# Patient Record
Sex: Female | Born: 1985 | Race: White | Hispanic: Yes | Marital: Single | Smoking: Never smoker
Health system: Southern US, Community
[De-identification: ages and names within clinical notes are randomized; demographics above are authoritative.]

## PROBLEM LIST (undated history)

## (undated) ENCOUNTER — Ambulatory Visit: Admission: EM | Payer: Self-pay

## (undated) ENCOUNTER — Inpatient Hospital Stay (HOSPITAL_COMMUNITY): Payer: Self-pay

## (undated) ENCOUNTER — Emergency Department (HOSPITAL_COMMUNITY): Payer: Self-pay | Source: Home / Self Care

## (undated) DIAGNOSIS — N159 Renal tubulo-interstitial disease, unspecified: Secondary | ICD-10-CM

## (undated) DIAGNOSIS — O24419 Gestational diabetes mellitus in pregnancy, unspecified control: Secondary | ICD-10-CM

## (undated) HISTORY — PX: APPENDECTOMY: SHX54

## (undated) HISTORY — PX: CHOLECYSTECTOMY: SHX55

---

## 2007-08-23 ENCOUNTER — Ambulatory Visit: Payer: Self-pay | Admitting: Internal Medicine

## 2007-10-20 ENCOUNTER — Emergency Department (HOSPITAL_COMMUNITY): Admission: EM | Admit: 2007-10-20 | Discharge: 2007-10-20 | Payer: Self-pay | Admitting: Emergency Medicine

## 2007-10-20 ENCOUNTER — Inpatient Hospital Stay (HOSPITAL_COMMUNITY): Admission: AD | Admit: 2007-10-20 | Discharge: 2007-10-20 | Payer: Self-pay | Admitting: Obstetrics

## 2008-01-28 ENCOUNTER — Inpatient Hospital Stay (HOSPITAL_COMMUNITY): Admission: AD | Admit: 2008-01-28 | Discharge: 2008-01-30 | Payer: Self-pay | Admitting: Obstetrics

## 2008-03-16 ENCOUNTER — Ambulatory Visit: Payer: Self-pay | Admitting: Internal Medicine

## 2008-03-16 ENCOUNTER — Encounter: Payer: Self-pay | Admitting: Internal Medicine

## 2008-03-16 DIAGNOSIS — R21 Rash and other nonspecific skin eruption: Secondary | ICD-10-CM | POA: Insufficient documentation

## 2008-03-20 ENCOUNTER — Ambulatory Visit: Payer: Self-pay | Admitting: *Deleted

## 2008-05-09 ENCOUNTER — Ambulatory Visit: Payer: Self-pay | Admitting: Internal Medicine

## 2008-05-10 ENCOUNTER — Ambulatory Visit: Payer: Self-pay | Admitting: Family Medicine

## 2008-08-07 ENCOUNTER — Ambulatory Visit: Payer: Self-pay | Admitting: Internal Medicine

## 2010-05-06 LAB — CBC
HCT: 32.5 % — ABNORMAL LOW (ref 36.0–46.0)
Hemoglobin: 11.2 g/dL — ABNORMAL LOW (ref 12.0–15.0)
MCHC: 34.5 g/dL (ref 30.0–36.0)
RDW: 13.2 % (ref 11.5–15.5)

## 2010-09-02 ENCOUNTER — Observation Stay (HOSPITAL_COMMUNITY)
Admission: EM | Admit: 2010-09-02 | Discharge: 2010-09-04 | Disposition: A | Discharge status: Home / Self Care | Payer: Medicaid Other | Attending: General Surgery | Admitting: General Surgery

## 2010-09-02 ENCOUNTER — Emergency Department (HOSPITAL_COMMUNITY): Payer: Medicaid Other

## 2010-09-02 DIAGNOSIS — K810 Acute cholecystitis: Principal | ICD-10-CM | POA: Insufficient documentation

## 2010-09-02 DIAGNOSIS — Z01812 Encounter for preprocedural laboratory examination: Secondary | ICD-10-CM | POA: Insufficient documentation

## 2010-09-02 LAB — CBC
HCT: 36.6 % (ref 36.0–46.0)
Hemoglobin: 13.1 g/dL (ref 12.0–15.0)
MCV: 86.7 fL (ref 78.0–100.0)
RDW: 12.5 % (ref 11.5–15.5)
WBC: 13.2 10*3/uL — ABNORMAL HIGH (ref 4.0–10.5)

## 2010-09-02 LAB — LIPASE, BLOOD: Lipase: 27 U/L (ref 11–59)

## 2010-09-02 LAB — URINE MICROSCOPIC-ADD ON

## 2010-09-02 LAB — DIFFERENTIAL
Eosinophils Relative: 3 % (ref 0–5)
Lymphocytes Relative: 17 % (ref 12–46)
Lymphs Abs: 2.3 10*3/uL (ref 0.7–4.0)

## 2010-09-02 LAB — URINALYSIS, ROUTINE W REFLEX MICROSCOPIC
Bilirubin Urine: NEGATIVE
Glucose, UA: NEGATIVE mg/dL
Ketones, ur: NEGATIVE mg/dL
pH: 6.5 (ref 5.0–8.0)

## 2010-09-02 LAB — COMPREHENSIVE METABOLIC PANEL
ALT: 49 U/L — ABNORMAL HIGH (ref 0–35)
Albumin: 4 g/dL (ref 3.5–5.2)
Alkaline Phosphatase: 62 U/L (ref 39–117)
BUN: 10 mg/dL (ref 6–23)
Potassium: 3.7 mEq/L (ref 3.5–5.1)
Sodium: 139 mEq/L (ref 135–145)
Total Protein: 7.8 g/dL (ref 6.0–8.3)

## 2010-09-03 ENCOUNTER — Other Ambulatory Visit (INDEPENDENT_AMBULATORY_CARE_PROVIDER_SITE_OTHER): Payer: Self-pay | Admitting: General Surgery

## 2010-09-03 DIAGNOSIS — K8010 Calculus of gallbladder with chronic cholecystitis without obstruction: Secondary | ICD-10-CM

## 2010-09-04 NOTE — H&P (Signed)
  Martha [name], Martha [name] NO.:  [id number]  MEDICAL RECORD NO.:  [id number]  LOCATION:  5154                         FACILITY:  [facility]  PHYSICIAN:  Abigail Miyamoto, M.D. DATE OF BIRTH:  1985-10-13  DATE OF ADMISSION: DATE OF DISCHARGE:                             HISTORY & PHYSICAL   CHIEF COMPLAINT:  Right upper quadrant abdominal pain.  HISTORY:  A 25 year old Hispanic female who presents with right upper quadrant abdominal pain, nausea, and vomiting.  She has had epistaxis in the past.  Because this has been unrelenting, decision was made to proceed her onto the emergency room.  Surgery has been consulted.  She is otherwise healthy.  She speaks a little [place].  PAST MEDICAL HISTORY:  Negative.  PAST SURGICAL HISTORY:  Negative.  MEDICATIONS:  None.  ALLERGIES:  No known drug allergies.  SOCIAL HISTORY:  She does not smoke.  She does not drink alcohol.  FAMILY HISTORY:  Negative for diabetes and hypertension.  REVIEW OF SYSTEMS:  GENERAL:  Negative fever or chills.  PULMONARY: Negative cough, shortness of breath, difficulty breathing.  CARDIAC: Negative chest pain, irregular heartbeat.  ABDOMEN:  Listed as above. There is no hematemesis.  Bowel movements are normal.  Urinary negative for dysuria or hematuria.  Rest of the review of systems including skin, eyes, ears, nose, throat, musculoskeletal, neurologic, psychiatric, endocrine are normal.  PHYSICAL EXAMINATION:  GENERAL:  This is a Hispanic female who is in mild discomfort. VITAL SIGNS:  Temperature 97.6, pulse 76, respiratory rate 16, blood pressure is 111/74, saturating 100% on room air. EYES:  Anicteric.  Pupils reactive bilaterally. ENT:  External ears and nose are normal.  Hearing is normal.  Oropharynx is clear. NECK:  Supple.  Trachea is midline.  There is no thyromegaly. LUNGS:  Clear to auscultation bilaterally.  Normal respiratory effort. CARDIOVASCULAR:  Regular rate and rhythm.   There are no murmurs.  There is no peripheral edema. ABDOMEN:  Soft.  There is tenderness and guarding in the upper quadrant which is moderate.  There are no hernias.  There is no organomegaly. SKIN:  Shows no rash, no jaundice. EXTREMITIES:  Warm and well-perfused.  No edema, clubbing, or cyanosis. STRENGTH AND NEUROLOGIC:  Intact in all four extremities.  Pulses are intact in all four extremities.  DATA REVIEW:  The patient has an ultrasound of the abdomen showing to have a cholelithiasis, minimal gallbladder wall thickening, bile duct is normal.  She has a CBC with elevated white blood count 13.2.  Liver function tests show a bilirubin of 0.4, alk phos is 62, AST 44, ALT 49.  IMPRESSION:  This patient with acute cholecystitis with cholelithiasis. She will be admitted to the hospital for IV antibiotics, bowel rest, and probable laparoscopic cholecystectomy.     Abigail Miyamoto, M.D.     DB/MEDQ  D:  09/03/2010  T:  09/03/2010  Job:  [id number]  Electronically Signed by Abigail Miyamoto M.D. on 09/04/2010 01:40:29 PM

## 2010-09-05 NOTE — Op Note (Signed)
[name], [name] NO.:  [id number]  MEDICAL RECORD NO.:  [id number]  LOCATION:  5154                         FACILITY:  [facility]  PHYSICIAN:  Martha [name], MDDATE OF BIRTH:  November 22, 1985  DATE OF PROCEDURE:  09/03/2010 DATE OF DISCHARGE:                              OPERATIVE REPORT   PREOPERATIVE DIAGNOSIS:  Acute cholecystitis.  POSTOPERATIVE DIAGNOSIS:  Acute cholecystitis.  PROCEDURE:  Laparoscopic cholecystectomy.  SURGEON:  Martha Gosling, MD.  ASSISTANT:  Brayton El, PA-C  ANESTHESIA:  General.  Specimens of gallbladder and contents to pathology.  ESTIMATED BLOOD LOSS:  Minimal.  COMPLICATIONS:  None.  DRAINS:  None.  DISPOSITION:  The patient to recovery room in stable condition.  INDICATIONS:  This is a 25 year old female who has a history of biliary colic who presents now with unrelenting right upper quadrant pain and on exam consistent with cholecystitis.  Her ultrasound shows gallstones with minimally thickened wall and a normal common bile duct.  Her liver function tests are normal and her white blood cell count is elevated through a translator.  I discussed with her a laparoscopic cholecystectomy when the risks and benefits associated with that procedure.  PROCEDURE:  After informed consent was obtained, the patient was taken to the operating room.  She was administered Zosyn on the floor per Dr. Eliberto Ivory orders.  She had sequential compression devices were placed on her lower extremities prior to induction with anesthesia.  She was then placed under general anesthesia without complication.  Her abdomen was then prepped and draped in standard sterile surgical fashion. Surgical time-out was then performed.  I infiltrated 0.25% Marcaine below the umbilicus.  I then made an incision with an 11-blade, I carried this down to the umbilical stalk, this was grasped.  The fascia was entered with an 11-blade.   The peritoneum was entered bluntly.  I then placed a 0 Vicryl pursestring suture through the fascia.  A Hasson trocar was then introduced and the abdomen was insufflated with 15 mmHg pressure.  Three further 5-mm ports were placed in the epigastrium and right upper quadrant.  All of these were done under direct vision without complication after infiltration with local anesthetic.  She was then placed in the reverse Trendelenburg position.  I then grasped her gallbladder and retracted it cephalad and lateral.  There were some adhesions at the triangle of Calot that were taken down with blunt dissection.  Eventually, I was able to clearly obtain the critical view of safety.  I then clipped the artery divided that and I then clipped the duct in the similar fashion and divided that.  The gallbladder was removed from the liver bed without difficulty.  There was a small entrance into the gallbladder during this due to some adhesions and there was some leakage of bile making as a class III operation, but no leakage of any stones.  I then placed this in EndoCatch bag removing from the umbilicus.  Irrigation was performed. This was all clear.  Hemostasis was obtained and observed.  I then closed the Hasson port site with a pursestring suture and this completely obliterated the defect.  I then removed all  the other trocars after desufflating the abdomen.  These incisions were then closed with 4- 0 Monocryl and Dermabond.  She tolerated this well, was extubated in the operating room, transferred to the recovery room in stable condition.     Martha Gosling, MD     MCW/MEDQ  D:  09/03/2010  T:  09/03/2010  Job:  [id number]  Electronically Signed by Emelia Loron MD on 09/05/2010 02:20:01 PM

## 2010-09-17 ENCOUNTER — Encounter (INDEPENDENT_AMBULATORY_CARE_PROVIDER_SITE_OTHER): Payer: Self-pay | Admitting: Radiology

## 2010-09-17 ENCOUNTER — Ambulatory Visit (INDEPENDENT_AMBULATORY_CARE_PROVIDER_SITE_OTHER): Payer: Self-pay | Admitting: Radiology

## 2010-09-17 DIAGNOSIS — K8010 Calculus of gallbladder with chronic cholecystitis without obstruction: Secondary | ICD-10-CM

## 2010-09-17 NOTE — Progress Notes (Signed)
Martha [name] is a 25 y.o. female who had a laparoscopic cholecystectomy with intraoperative cholangiogram.  The pathology report confirmed cholelithiasis and cholecystitis.  The patient reports that they are feeling well with normal bowel movements and good appetite.  The pre-operative symptoms of abdominal pain, nausea, and vomiting have resolved.    Physical examination - Incisions appear well-healed with no sign of infection or bleeding.   Abdomen - soft, non-tender  Impression:  s/p laparoscopic cholecystectomy  Plan:  She may resume a regular diet and full activity.  She may follow-up on a PRN basis.

## 2010-10-21 LAB — DIFFERENTIAL
Basophils Absolute: 0
Basophils Relative: 0
Eosinophils Absolute: 0.6
Eosinophils Relative: 6 — ABNORMAL HIGH
Monocytes Absolute: 0.7
Neutro Abs: 6.3

## 2010-10-21 LAB — CBC
HCT: 32.8 — ABNORMAL LOW
Hemoglobin: 11.3 — ABNORMAL LOW
MCHC: 34.5
MCV: 93.1
RDW: 13.5

## 2010-12-02 NOTE — Progress Notes (Signed)
I help [employer] financial assistance.  Interpreter Jumana Paccione 11/29/10 11:00 am

## 2011-06-12 ENCOUNTER — Emergency Department (HOSPITAL_COMMUNITY)
Admission: EM | Admit: 2011-06-12 | Discharge: 2011-06-13 | Disposition: A | Discharge status: Home / Self Care | Payer: Worker's Compensation | Attending: Emergency Medicine | Admitting: Emergency Medicine

## 2011-06-12 ENCOUNTER — Encounter (HOSPITAL_COMMUNITY): Payer: Self-pay | Admitting: Emergency Medicine

## 2011-06-12 DIAGNOSIS — W19XXXA Unspecified fall, initial encounter: Secondary | ICD-10-CM

## 2011-06-12 DIAGNOSIS — M549 Dorsalgia, unspecified: Secondary | ICD-10-CM | POA: Insufficient documentation

## 2011-06-12 NOTE — ED Notes (Signed)
Patient fell at work, complaining of lower back pain.

## 2011-06-13 ENCOUNTER — Emergency Department (HOSPITAL_COMMUNITY): Payer: Worker's Compensation

## 2011-06-13 MED ORDER — METHOCARBAMOL 500 MG PO TABS: 500.0000 mg | ORAL_TABLET | Freq: Two times a day (BID) | ORAL | Status: DC

## 2011-06-13 MED ORDER — TRAMADOL HCL 50 MG PO TABS: 50.0000 mg | ORAL_TABLET | Freq: Four times a day (QID) | ORAL | Status: DC | PRN

## 2011-06-13 MED ORDER — METHOCARBAMOL 500 MG PO TABS
500.0000 mg | ORAL_TABLET | Freq: Once | ORAL | Status: AC
  Administered 2011-06-13: 500 mg via ORAL
  Filled 2011-06-13: qty 1

## 2011-06-13 MED ORDER — KETOROLAC TROMETHAMINE 60 MG/2ML IM SOLN
60.0000 mg | Freq: Once | INTRAMUSCULAR | Status: AC
  Administered 2011-06-13: 60 mg via INTRAMUSCULAR
  Filled 2011-06-13: qty 2

## 2011-06-13 NOTE — ED Provider Notes (Signed)
Medical screening examination/treatment/procedure(s) were performed by non-physician practitioner and as supervising physician I was immediately available for consultation/collaboration.  Faline Langer M Riyanshi Wahab, MD 06/13/11 0655 

## 2011-06-13 NOTE — ED Provider Notes (Signed)
History     CSN: [id number]  Arrival date & time 06/12/11  2214   First MD Initiated Contact with Patient 06/13/11 0026      12:42 AM HPI Patient reports she was at work when she slipped and fell onto her lower back. Reports since then has had severe pain. Painful to move. Denies numbness, tingling, weakness. Denies incontinence, abdominal pain, nausea, vomiting, fever, neck pain.  Patient is a 26 y.o. female presenting with fall and back pain. The history is provided by the patient.  Fall The accident occurred 3 to 5 hours ago. The fall occurred while walking. She landed on a hard floor. There was no blood loss. Pain location: lower back. The pain is severe. She was not ambulatory at the scene. There was no entrapment after the fall. There was no drug use involved in the accident. There was no alcohol use involved in the accident. Pertinent negatives include no fever, no numbness, no abdominal pain, no nausea, no vomiting, no hematuria, no headaches, no hearing loss, no loss of consciousness and no tingling.  Back Pain  This is a new problem. The problem occurs constantly. The problem has not changed since onset.The pain is associated with falling. The pain is present in the lumbar spine. The pain radiates to the left thigh and right thigh. The pain is moderate. The symptoms are aggravated by bending and twisting. Pertinent negatives include no chest pain, no fever, no numbness, no headaches, no abdominal pain, no dysuria, no pelvic pain, no tingling and no weakness. She has tried nothing for the symptoms.    History reviewed. No pertinent past medical history.  Past Surgical History  Procedure Date  . Cholecystectomy     No family history on file.  History  Substance Use Topics  . Smoking status: Not on file  . Smokeless tobacco: Not on file  . Alcohol Use:     OB History    Grav Para Term Preterm Abortions TAB SAB Ect Mult Living                  Review of Systems    Constitutional: Negative for fever and chills.  HENT: Negative for neck pain and neck stiffness.   Cardiovascular: Negative for chest pain.  Gastrointestinal: Negative for nausea, vomiting and abdominal pain.  Genitourinary: Negative for dysuria, urgency, frequency, hematuria, flank pain, vaginal bleeding, vaginal discharge and pelvic pain.  Musculoskeletal: Positive for back pain.       Denies saddle anesthesias, or perineal numbness, urinary or bowel incontinence  Neurological: Negative for tingling, loss of consciousness, weakness, numbness and headaches.  All other systems reviewed and are negative.    Allergies  Review of patient's allergies indicates no known allergies.  Home Medications  No current outpatient prescriptions on file.  BP 121/89  Pulse 64  Temp(Src) 98 F (36.7 C) (Oral)  Resp 16  SpO2 99%  Physical Exam  Vitals reviewed. Constitutional: She is oriented to person, place, and time. Vital signs are normal. She appears well-developed and well-nourished. No distress.  HENT:  Head: Normocephalic and atraumatic.  Eyes: Pupils are equal, round, and reactive to light.  Neck: Neck supple.  Pulmonary/Chest: Effort normal.  Musculoskeletal:       Lumbar back: She exhibits decreased range of motion, tenderness, bony tenderness, pain and spasm. She exhibits no swelling, no deformity, no laceration and normal pulse.       Back:  Neurological: She is alert and oriented to person, place,  and time.  Skin: Skin is warm and dry. No rash noted. No erythema. No pallor.  Psychiatric: She has a normal mood and affect. Her behavior is normal.    ED Course  Procedures  Dg Lumbar Spine Complete  06/13/2011  *RADIOLOGY REPORT*  Clinical Data: Larey Seat, low back pain.  LUMBAR SPINE - COMPLETE 4+ VIEW  Comparison: None.  Findings: Five non-rib bearing lumbar vertebrae with anatomic alignment.  No fractures.  Well-preserved disc spaces. Assimilation joint between the right  transverse process of L5 and the first sacral segment.  No pars defects.  No significant facet arthropathy.  Visualized sacroiliac joints intact.  Intrauterine device.  IMPRESSION: No acute or significant abnormalities.  Original Report Authenticated By: Arnell Sieving, M.D.     MDM     Patient has negative x-ray. Will discharge home with muscle relaxants and analgesics. Provided patient with instructions for back pain in Spanish. Advised return to emergency room for worsening symptoms. Patient voices understanding and is ready for discharge   Thomasene Lot, Cordelia Poche 06/13/11 0131

## 2011-06-13 NOTE — Discharge Instructions (Signed)
Dolor de espalda en el adulto (Back Pain, Adult) El dolor de cintura es frecuente. Aproximadamente 1 de cada 5 personas lo sufren.La causa rara vez pone en peligro la vida. Con frecuencia mejora luego de algn tiempo.Alrededor de la mitad de las personas que sufren un inicio sbito de dolor de cintura, se sentirn mejor luego de 2 semanas. Aproximadamente 8 de cada 10 se sentirn mejor luego de 6 semanas.  CAUSAS  Algunas causas comunes son:   Distensin de los msculos o ligamentos que sostienen la columna vertebral.   Desgaste (degeneracin) de los discos vertebrales.   Artritis   Traumatismos directos en la espalda.  DIAGNSTICO  La mayor parte de las veces, la causa directa no se conoce.Sin embargo, el dolor puede tratarse efectivamente an cuando no se conozca la causa.Una de las formas ms precisas de asegurar que la causa del dolor no constituye un peligro es responder a las preguntas del mdico acerca de su salud y sus sntomas. Si el mdico necesita ms informacin, podr indicar anlisis de laboratorio o realizar un diagnstico por imgenes (radiografas o resonancia magntica).Sin embargo, aunque las imgenes muestren modificaciones, generalmente no es necesaria la ciruga.  INSTRUCCIONES PARA EL CUIDADO EN EL HOGAR  En algunas personas, el dolor de espalda vuelve.Como rara vez es peligroso, los pacientes pueden aprender a manejarlo ellos mismos.   Mantngase activo. Si permanece sentado o de pie mucho tiempo en el mismo lugar, se tensiona la espalda.  No se siente, maneje ni se quede parado en un mismo lugar por ms de 30 minutos. Realice caminatas cortas en superficies planas ni bien el dolor haya cedido. Trate de aumentar cada da el tiempo que camina .   No se quede en la cama.Si hace reposo durante ms de 1 o 2 das, puede retrasar la recuperacin.   No evite los ejercicios ni el trabajo.El cuerpo est hecho para moverse.No es peligroso estar activo, aunque le duela la  espalda.La espalda se curar ms rpido si contina sus actividades antes de que el dolor se vaya.   Preste atencin a su cuerpo cuando se incline y se levante. Muchas personas sienten menos molestias cuando levantan objetos si doblan las rodillas, mantienen la carga cerca del cuerpo y evitan torcerse. Generalmente, las posiciones ms cmodas son las que ejercen menos tensin en la espalda en recuperacin.   Encuentre una posicin cmoda para dormir. Utilice un colchn firme y recustese de costado. Doble ligeramente sus rodillas. Si se recuesta sobre su espalda, coloque una almohada debajo de sus rodillas.   Tome slo medicamentos de venta libre o recetados, segn las indicaciones del mdico. Los medicamentos de venta libre para calmar el dolor y reducir la inflamacin, son los que en general ms ayudan.El mdico podr prescribirle relajantes musculares.Estos medicamentos calman el dolor de modo que pueda retornar a sus actividades normales y a realizar ejercicios saludables.   Aplique hielo sobre la zona lesionada.   Ponga el hielo en una bolsa plstica.   Colquese una toalla entre la piel y la bolsa de hielo.   Deje la bolsa de hielo durante 15 a 20minutos 3 a 4 veces por da, durante los primeros 2  3 das. Luego podr alternar entre calor y hielo para reducir el dolor y los espasmos.   Consulte a su mdico si puede tratar de hacer ejercicios para la espalda y recibir un masaje suave. Pueden ser beneficiosos.   Evite sentirse ansioso o estresado.El estrs aumenta la tensin muscular y puede empeorar el dolor   de espalda.Es importante reconocer cuando est ansioso o estresado y aprender la forma de controlarlos.El ejercicio es una gran opcin.  SOLICITE ATENCIN MDICA SI:   Siente un dolor que no se alivia con reposo o medicamentos.   El dolor no mejora en 1 semana.   Desarrolla nuevos sntomas.   No se siente bien en general.  SOLICITE ATENCIN MDICA DE INMEDIATO  SI:  Siente un dolor que se irradia desde la espalda hacia sus piernas.   Desarrolla nuevos problemas en el intestino o la vejiga.   Siente debilidad o adormecimiento inusual en sus brazos o piernas.   Presenta nuseas o vmitos.   Presenta dolor abdominal.   Se siente desfalleciente.  Document Released: 01/06/2005 Document Revised: 12/26/2010 [employer] Patient Information 2012 [employer]. 

## 2011-06-18 ENCOUNTER — Encounter (HOSPITAL_COMMUNITY): Payer: Self-pay | Admitting: Emergency Medicine

## 2011-06-18 ENCOUNTER — Emergency Department (HOSPITAL_COMMUNITY)
Admission: EM | Admit: 2011-06-18 | Discharge: 2011-06-18 | Disposition: A | Discharge status: Home / Self Care | Payer: Self-pay | Attending: Emergency Medicine | Admitting: Emergency Medicine

## 2011-06-18 DIAGNOSIS — M545 Low back pain, unspecified: Secondary | ICD-10-CM | POA: Insufficient documentation

## 2011-06-18 DIAGNOSIS — M549 Dorsalgia, unspecified: Secondary | ICD-10-CM

## 2011-06-18 MED ORDER — DIAZEPAM 10 MG PO TABS: 10.0000 mg | ORAL_TABLET | Freq: Four times a day (QID) | ORAL | Status: AC | PRN

## 2011-06-18 MED ORDER — HYDROCODONE-ACETAMINOPHEN 5-325 MG PO TABS: 1.0000 | ORAL_TABLET | ORAL | Status: AC | PRN

## 2011-06-18 NOTE — ED Provider Notes (Signed)
History   This chart was scribed for Flint Melter, MD by Brooks Sailors. The patient was seen in room STRE3/STRE3. Patient's care was started at 1539.   CSN: [id number]  Arrival date & time 06/18/11  1539   First MD Initiated Contact with Patient 06/18/11 1749      Chief Complaint  Patient presents with  . Back Pain    (Consider location/radiation/quality/duration/timing/severity/associated sxs/prior treatment) HPI Patient does not speak english, patient history interpreted by friend at bedside.  Martha [name] is a 26 y.o. female who presents to the Emergency Department complaining of back pain from a fall 6 days ago. Pt was seen in ER 6 days ago 06/12/11. Pt localized pain to the left side of her back and says it radiates down into left leg. Patient says she can only sleep on the right side. Pt was given Robaxin and Tramadol in ER.    History reviewed. No pertinent past medical history.  Past Surgical History  Procedure Date  . Cholecystectomy     No family history on file.  History  Substance Use Topics  . Smoking status: Never Smoker   . Smokeless tobacco: Not on file  . Alcohol Use: No    OB History    Grav Para Term Preterm Abortions TAB SAB Ect Mult Living                  Review of Systems  All other systems reviewed and are negative.    Allergies  Review of patient's allergies indicates no known allergies.  Home Medications   Current Outpatient Rx  Name Route Sig Dispense Refill  . DIAZEPAM 10 MG PO TABS Oral Take 1 tablet (10 mg total) by mouth every 6 (six) hours as needed (muscle spasm). 20 tablet 0  . HYDROCODONE-ACETAMINOPHEN 5-325 MG PO TABS Oral Take 1 tablet by mouth every 4 (four) hours as needed for pain. 20 tablet 0    BP 127/86  Pulse 99  Temp(Src) 98 F (36.7 C) (Oral)  Resp 18  SpO2 99%  Physical Exam  Nursing note and vitals reviewed. Constitutional: She is oriented to person, place, and time. She appears well-developed and  well-nourished. No distress.  HENT:  Head: Normocephalic and atraumatic.  Eyes: EOM are normal.  Neck: Neck supple. No tracheal deviation present.  Cardiovascular: Normal rate.   Pulmonary/Chest: Effort normal. No respiratory distress.  Abdominal: She exhibits no distension.  Musculoskeletal: Normal range of motion. She exhibits tenderness. She exhibits no edema.       Left lumbar tenderness  Neurological: She is alert and oriented to person, place, and time. No sensory deficit.  Skin: Skin is warm and dry.  Psychiatric: She has a normal mood and affect. Her behavior is normal.    ED Course  Procedures (including critical care time)  1756 Patient informed of current plan for treatment and evaluation and agrees with plan at this time. Pt instructed to use heat and see specialist if not better by Monday, in 5 days.    Labs Reviewed - No data to display No results found.   1. Back pain       MDM  Persistent, back pain, without evident fracture, cauda equina or suspected stenosis. She likely has spasm that is preventing her pain improvement.  Plan: Home Medications-  , Percocet , Valium ; Home Treatments-  Heat  ; Recommended follow up- Ortho prn    I personally performed the services described in this  documentation, which was scribed in my presence. The recorded information has been reviewed and considered.     Flint Melter, MD 06/18/11 2148

## 2011-06-18 NOTE — ED Notes (Signed)
Pt was seen here on 5/23 after a fall at work.  Pt c/o continued pain to left lower back

## 2011-06-18 NOTE — Discharge Instructions (Signed)
Use heat on the back, 3-4 times a day to help the discomfort of the back. See the Back Specialist if not better in 3 days.     Ejercicios para la espalda (Back Exercises) Estos ejercicios ayudan a tratar y prevenir lesiones en la espalda. El objetivo es aumentar la fuerza de los msculos abdominales y dorsales y la flexibilidad de la espalda. Debe comenzar con estos ejercicios cuando ya no tenga dolor. Los ejercicios para la espalda incluyen:  Inclinacin de la pelvis - Recustese sobre la espalda con las rodillas flexionadas. Incline la pelvis hasta que la parte inferior de la espalda se apoye en el piso. Mantenga esta posicin durante 5 a 10 segundos y repita entre 5 y 10 veces.   Rodilla al pecho - Empuje primero una rodilla contra el pecho y [place] 20 a 30 segundos; repita con la otra rodilla y luego con ambas a la vez. Esto puede realizarlo con la otra pierna extendida o flexionada, del modo en que se sienta ms cmodo.   Abdominales o despegar el cccix del suelo empleando la musculatura abdominal - Flexione las rodillas 90 grados. Comience inclinando la pelvis y realice un ejercicio abdominal lento y parcial, elevando el tronco slo entre 30 y 45 grados del suelo. Emplee al [employer] 2 y 3 segundos para cada abdominal. No realice los abdominales con las rodillas extendidas. Si le resulta difcil realizar abdominales parciales, simplemente haga lo que se explic anteriormente, pero slo contraiga los msculos abdominales y Buyer, retail tal como se le ha indicado.   Inclinacin de la cadera - Recustese sobre la espalda con las rodillas flexionadas a 90 grados. Empjese con los pies y los hombros mientras eleva la cadera un par de centmetros del suelo, [place] durante 10 segundos y repita entre 5 y 10 veces.   Arcos dorsales - Acustese sobre el [place] e impulse el tronco hacia atrs sobre los codos flexionados. Presione lentamente con las manos, formando un arco con la zona  inferior de la espalda. Repita entre 3 y 5 veces. Al realizar las repeticiones, luego de un tiempo disminuirn la rigidez y las [place].   Elevacin de los hombros - Acustese hacia abajo con los brazos a los lados del cuerpo. Presione las caderas y Dance movement psychotherapist torso contra el suelo mientras eleva lentamente la cabeza y los hombros del suelo.  No exagere con los ejercicios, especialmente en el comienzo. Los ejercicios pueden causar alguna molestia leve en la espalda durante algunos minutos; sin embargo, si el dolor es muy intenso, o dura ms de 15 minutos, no siga con la actividad fsica hasta que consulte al profesional que lo asiste. Los problemas en la espalda mejoran de Valentine lenta con esta terapia.  Consulte al profesional para que lo ayude a planificar un programa de ejercicios adecuado para su espalda. Document Released: 01/06/2005 Document Revised: 12/26/2010 [facility] Patient Information 2012 [place], [place].Dolor de espalda, adultos  (Back Pain, Adult)  El dolor de espalda es frecuente. Con frecuencia mejora luego de algn tiempo. La causa suele no ser un peligro para la vida. La mayora de las personas aprende a controlarlo por sus propios medios.  CUIDADOS EN EL HOGAR   Mantngase fsicamente activo. Si puede, comience a dar cortas caminatas en un suelo plano. Trate de caminar un poco ms cada da.   Nopermanezca sentado, de pie ni conduzca automviles durante ms de 30 minutos seguidos. Nose quede en la cama.   Noevite los ejercicios ni el trabajo. La actividad puede ayudar  a que la espalda se cure ms rpido.   Tenga cuidado al inclinarse o al levantar un objeto. Doble las rodillas, mantenga el [place] cerca de su cuerpo y no gire.   Duerma sobre un [employer] Acustese sobre un lado y doble las rodillas. Si se [employer], coloque una almohada debajo de las rodillas.   Tome la medicacin slo como le haya indicado el mdico.   Aplique hielo sobre la zona lesionada.    Ponga el hielo en una bolsa plstica.   Colquese una toalla entre la piel y la bolsa de hielo.   Deje la bolsa de hielo durante 15 a 3 a 4 veces por da, durante los primeros 2 o 3 das. Luego puede ir alternando entre hielo y compresas calientes.   Consulte a su mdico sobre cul ejercicios o IT sales professional.   Evite sentirse ansioso o estresado. Encuentre la forma de enfrentar el estrs, como por Lexicographer ejercicios.  SOLICITE AYUDA DE INMEDIATO SI:   El dolor no desaparece aunque haga reposo o tome medicamentos para Chief Technology Officer.   El dolor no desaparece en una semana.   Tiene nuevos problemas.   No se siente mejor.   El dolor se extiende a las piernas.   No puede controlar la orina o la materia fecal.   Siente que los brazos estn dbiles o pierde la sensibilidad (estn adormecidos).   Tiene Programme researcher, broadcasting/film/video (nuseas) y vmitos.   Siente dolor abdominal.   Siente que se desvanece (se desmaya).  ASEGRESE DE QUE:   Comprende estas instrucciones.   Controlar su enfermedad.   Solicitar ayuda de inmediato si no mejora o si empeora.  Document Released: 07/22/2010 Document Revised: 12/26/2010 [facility] Patient Information 2012 [place], [place].

## 2011-06-18 NOTE — ED Notes (Addendum)
Patient family member states the patient was hurt at work last Thursday. Patient was on a small ladder reaching up to pick up a box and she fell from the box. d and came to [facility] and was given medication and the medication is not helping. Patient is still having pain in her left side flank pain and lower left side abdomen. Patient states she is unable to take big steps or bend over to pick up anything. Family at bedside as interrupter for patient.

## 2013-01-20 NOTE — L&D Delivery Note (Signed)
Delivery Note At 6:33 PM a viable female was delivered via Vaginal, Spontaneous Delivery (Presentation: Left Occiput Anterior).  APGAR: 7, ; weight  .   Placenta status: Intact, Spontaneous.  Cord: 3 vessels with the following complications: None.    Anesthesia: Local  Episiotomy: None Lacerations: 1st degree;Perineal Suture Repair: 3.0 vicryl rapide 1 stitch Est. Blood Loss (mL): 400  Mom to postpartum.  Baby to Couplet care / Skin to Skin.  Cherylee Rawlinson 11/21/2013, 7:02 PM

## 2013-07-06 ENCOUNTER — Ambulatory Visit (INDEPENDENT_AMBULATORY_CARE_PROVIDER_SITE_OTHER): Payer: Self-pay

## 2013-07-06 DIAGNOSIS — Z3201 Encounter for pregnancy test, result positive: Secondary | ICD-10-CM

## 2013-07-06 DIAGNOSIS — N926 Irregular menstruation, unspecified: Secondary | ICD-10-CM

## 2013-07-06 LAB — POCT PREGNANCY, URINE: PREG TEST UR: POSITIVE — AB

## 2013-07-06 NOTE — Progress Notes (Signed)
Patient here today for pregnancy test. Reports she was concerned for pregnancy after experiencing having sleepiness, N/V and felt movement in her abdomen two weeks ago-- took a pregnancy test at that time and it was positive. Patient went to health department and they told her to come here to clinic. Patient does have IUD and to her knowledge it is a 10 year IUD and still present. Patient given MAU slot for tomorrow 07/07/13 at 1500 for IUD removal. Ultrasound scheduled for tomorrow 07/07/13 at 0730 to determine location/viability. Patient advised to arrive at 0715 with a full bladder and informed her plan of care will be developed tomorrow at clinic appointment after ultrasound results are reviewed and IUD removed. Patient verbalized understanding. No further questions or concerns.

## 2013-07-07 ENCOUNTER — Ambulatory Visit (HOSPITAL_COMMUNITY)
Admission: RE | Admit: 2013-07-07 | Discharge: 2013-07-07 | Disposition: A | Discharge status: Home / Self Care | Payer: Medicaid Other | Source: Ambulatory Visit | Attending: Obstetrics and Gynecology | Admitting: Obstetrics and Gynecology

## 2013-07-07 ENCOUNTER — Ambulatory Visit (INDEPENDENT_AMBULATORY_CARE_PROVIDER_SITE_OTHER): Payer: Medicaid Other | Admitting: Obstetrics & Gynecology

## 2013-07-07 ENCOUNTER — Encounter: Payer: Self-pay | Admitting: Obstetrics & Gynecology

## 2013-07-07 ENCOUNTER — Other Ambulatory Visit: Payer: Self-pay | Admitting: Obstetrics and Gynecology

## 2013-07-07 VITALS — BP 116/80 | HR 102 | Wt 171.9 lb

## 2013-07-07 DIAGNOSIS — Z3482 Encounter for supervision of other normal pregnancy, second trimester: Secondary | ICD-10-CM

## 2013-07-07 DIAGNOSIS — Z3201 Encounter for pregnancy test, result positive: Secondary | ICD-10-CM

## 2013-07-07 DIAGNOSIS — Z3689 Encounter for other specified antenatal screening: Secondary | ICD-10-CM | POA: Insufficient documentation

## 2013-07-07 DIAGNOSIS — Z30431 Encounter for routine checking of intrauterine contraceptive device: Secondary | ICD-10-CM

## 2013-07-07 LAB — OB RESULTS CONSOLE VARICELLA ZOSTER ANTIBODY, IGG: Varicella: IMMUNE

## 2013-07-07 LAB — SICKLE CELL SCREEN: SICKLE CELL SCREEN: NORMAL

## 2013-07-07 NOTE — Patient Instructions (Signed)
Second Trimester of Pregnancy The second trimester is from week 13 through week 28, months 4 through 6. The second trimester is often a time when you feel your best. Your body has also adjusted to being pregnant, and you begin to feel better physically. Usually, morning sickness has lessened or quit completely, you may have more energy, and you may have an increase in appetite. The second trimester is also a time when the fetus is growing rapidly. At the end of the sixth month, the fetus is about 9 inches long and weighs about 1 pounds. You will likely begin to feel the baby move (quickening) between 18 and 20 weeks of the pregnancy. BODY CHANGES Your body goes through many changes during pregnancy. The changes vary from woman to woman.   Your weight will continue to increase. You will notice your lower abdomen bulging out.  You may begin to get stretch marks on your hips, abdomen, and breasts.  You may develop headaches that can be relieved by medicines approved by your health care provider.  You may urinate more often because the fetus is pressing on your bladder.  You may develop or continue to have heartburn as a result of your pregnancy.  You may develop constipation because certain hormones are causing the muscles that push waste through your intestines to slow down.  You may develop hemorrhoids or swollen, bulging veins (varicose veins).  You may have back pain because of the weight gain and pregnancy hormones relaxing your joints between the bones in your pelvis and as a result of a shift in weight and the muscles that support your balance.  Your breasts will continue to grow and be tender.  Your gums may bleed and may be sensitive to brushing and flossing.  Dark spots or blotches (chloasma, mask of pregnancy) may develop on your face. This will likely fade after the baby is born.  A dark line from your belly button to the pubic area (linea nigra) may appear. This will likely fade  after the baby is born.  You may have changes in your hair. These can include thickening of your hair, rapid growth, and changes in texture. Some women also have hair loss during or after pregnancy, or hair that feels dry or thin. Your hair will most likely return to normal after your baby is born. WHAT TO EXPECT AT YOUR PRENATAL VISITS During a routine prenatal visit:  You will be weighed to make sure you and the fetus are growing normally.  Your blood pressure will be taken.  Your abdomen will be measured to track your baby's growth.  The fetal heartbeat will be listened to.  Any test results from the previous visit will be discussed. Your health care provider may ask you:  How you are feeling.  If you are feeling the baby move.  If you have had any abnormal symptoms, such as leaking fluid, bleeding, severe headaches, or abdominal cramping.  If you have any questions. Other tests that may be performed during your second trimester include:  Blood tests that check for:  Low iron levels (anemia).  Gestational diabetes (between 24 and 28 weeks).  Rh antibodies.  Urine tests to check for infections, diabetes, or protein in the urine.  An ultrasound to confirm the proper growth and development of the baby.  An amniocentesis to check for possible genetic problems.  Fetal screens for spina bifida and Down syndrome. HOME CARE INSTRUCTIONS   Avoid all smoking, herbs, alcohol, and unprescribed   drugs. These chemicals affect the formation and growth of the baby.  Follow your health care provider's instructions regarding medicine use. There are medicines that are either safe or unsafe to take during pregnancy.  Exercise only as directed by your health care provider. Experiencing uterine cramps is a good sign to stop exercising.  Continue to eat regular, healthy meals.  Wear a good support bra for breast tenderness.  Do not use hot tubs, steam rooms, or saunas.  Wear your  seat belt at all times when driving.  Avoid raw meat, uncooked cheese, cat litter boxes, and soil used by cats. These carry germs that can cause birth defects in the baby.  Take your prenatal vitamins.  Try taking a stool softener (if your health care provider approves) if you develop constipation. Eat more high-fiber foods, such as fresh vegetables or fruit and whole grains. Drink plenty of fluids to keep your urine clear or pale yellow.  Take warm sitz baths to soothe any pain or discomfort caused by hemorrhoids. Use hemorrhoid cream if your health care provider approves.  If you develop varicose veins, wear support hose. Elevate your feet for 15 minutes, 3-4 times a day. Limit salt in your diet.  Avoid heavy lifting, wear low heel shoes, and practice good posture.  Rest with your legs elevated if you have leg cramps or low back pain.  Visit your dentist if you have not gone yet during your pregnancy. Use a soft toothbrush to brush your teeth and be gentle when you floss.  A sexual relationship may be continued unless your health care provider directs you otherwise.  Continue to go to all your prenatal visits as directed by your health care provider. SEEK MEDICAL CARE IF:   You have dizziness.  You have mild pelvic cramps, pelvic pressure, or nagging pain in the abdominal area.  You have persistent nausea, vomiting, or diarrhea.  You have a bad smelling vaginal discharge.  You have pain with urination. SEEK IMMEDIATE MEDICAL CARE IF:   You have a fever.  You are leaking fluid from your vagina.  You have spotting or bleeding from your vagina.  You have severe abdominal cramping or pain.  You have rapid weight gain or loss.  You have shortness of breath with chest pain.  You notice sudden or extreme swelling of your face, hands, ankles, feet, or legs.  You have not felt your baby move in over an hour.  You have severe headaches that do not go away with  medicine.  You have vision changes. Document Released: 12/31/2000 Document Revised: 01/11/2013 Document Reviewed: 03/09/2012 [employer] Patient Information 2015 [employer]. This information is not intended to replace advice given to you by your health care provider. Make sure you discuss any questions you have with your health care provider.  

## 2013-07-07 NOTE — Progress Notes (Signed)
Patient has paragard iud. Was told to followup to see if it needed to be removed.

## 2013-07-07 NOTE — Progress Notes (Signed)
Patient ID: Martha [name], female   DOB: 04/26/1985, 28 y.o.   MRN: [id number] Agree with nurses's documentation of this patient's clinic encounter.  Catalina AntiguaPeggy Constant, MD

## 2013-07-08 LAB — OBSTETRIC PANEL
Antibody Screen: NEGATIVE
BASOS ABS: 0 10*3/uL (ref 0.0–0.1)
Basophils Relative: 0 % (ref 0–1)
EOS PCT: 8 % — AB (ref 0–5)
Eosinophils Absolute: 0.7 10*3/uL (ref 0.0–0.7)
HCT: 33.4 % — ABNORMAL LOW (ref 36.0–46.0)
Hemoglobin: 11.6 g/dL — ABNORMAL LOW (ref 12.0–15.0)
Hepatitis B Surface Ag: NEGATIVE
LYMPHS ABS: 1.7 10*3/uL (ref 0.7–4.0)
Lymphocytes Relative: 19 % (ref 12–46)
MCH: 30.5 pg (ref 26.0–34.0)
MCHC: 34.7 g/dL (ref 30.0–36.0)
MCV: 87.9 fL (ref 78.0–100.0)
Monocytes Absolute: 0.4 10*3/uL (ref 0.1–1.0)
Monocytes Relative: 5 % (ref 3–12)
Neutro Abs: 5.9 10*3/uL (ref 1.7–7.7)
Neutrophils Relative %: 68 % (ref 43–77)
Platelets: 355 10*3/uL (ref 150–400)
RBC: 3.8 MIL/uL — ABNORMAL LOW (ref 3.87–5.11)
RDW: 14.5 % (ref 11.5–15.5)
Rh Type: POSITIVE
Rubella: 4.76 Index — ABNORMAL HIGH (ref <0.90)
WBC: 8.7 10*3/uL (ref 4.0–10.5)

## 2013-07-08 LAB — HIV ANTIBODY (ROUTINE TESTING W REFLEX): HIV 1&2 Ab, 4th Generation: NONREACTIVE

## 2013-07-08 NOTE — Progress Notes (Signed)
Subjective:     Patient ID: Martha [name], female   DOB: 05/19/1985, 28 y.o.   MRN: [id number]  HPI Pt reports having an IUD placed and now she is pregnant.  [facility]  She as a Equities tradersono that did not reveal the IUD.  She denies noting her IUD falling out.   Review of Systems     Objective:   Physical Exam BP 116/80  Pulse 102  Wt 171 lb 14.4 oz (77.973 kg)  LMP 06/14/2013 GU: EGBUS: no lesions Vagina: no blood in vault Cervix: no lesion; no mucopurulent d/c Uterus:   07/07/2013 OB sono.  16 week IUP. No IUD noted     Assessment:     16 week IUP H/o IUD not found- I have explained to pt that it is possible tha tthere is an IUD that is just not visible but, that the risk of complications is not high and that her pregnancy will be managed like a routine pregnancy   Plan:     Rec commencing with routine PNC  Pt will f/u at the HD PNL drawn today

## 2013-07-09 LAB — GC/CHLAMYDIA PROBE AMP
CT Probe RNA: NEGATIVE
GC Probe RNA: NEGATIVE

## 2013-07-11 LAB — HEMOGLOBINOPATHY EVALUATION
HGB S QUANTITAION: 0 %
Hemoglobin Other: 0 %
Hgb A2 Quant: 2.8 % (ref 2.2–3.2)
Hgb A: 97.2 % (ref 96.8–97.8)
Hgb F Quant: 0 % (ref 0.0–2.0)

## 2013-07-25 LAB — OB RESULTS CONSOLE GC/CHLAMYDIA
CHLAMYDIA, DNA PROBE: NEGATIVE
Gonorrhea: NEGATIVE

## 2013-07-25 LAB — GLUCOSE TOLERANCE, 1 HOUR (50G) W/O FASTING: Glucose, 1 hour: 144

## 2013-07-25 LAB — DRUG SCREEN, URINE: Drug Screen, Urine: NEGATIVE

## 2013-07-25 LAB — AFP, QUAD SCREEN: Quad Risk Down Syndrome: NORMAL

## 2013-07-25 LAB — CYSTIC FIBROSIS DIAGNOSTIC STUDY: INTERPRETATION-CFDNA: NEGATIVE

## 2013-07-27 LAB — GLUCOSE, 2 HOUR GESTATIONAL: Glucose, 2 Hr, Gest: 196

## 2013-07-27 LAB — GLUCOSE TOLERANCE, 3 HOURS: Glucose, GTT - 1 Hour: 186 mg/dL (ref ?–200)

## 2013-07-27 LAB — GLUCOSE, 3 HOUR GESTATIONAL: Glucose, 3 Hr, Gest: 135

## 2013-07-27 LAB — GLUCOSE, FASTING GESTATIONAL: GLUCOSE FASTING: 81 mg/dL (ref 60–109)

## 2013-08-10 DIAGNOSIS — O99810 Abnormal glucose complicating pregnancy: Secondary | ICD-10-CM | POA: Insufficient documentation

## 2013-08-10 DIAGNOSIS — Z6832 Body mass index (BMI) 32.0-32.9, adult: Secondary | ICD-10-CM | POA: Insufficient documentation

## 2013-08-10 DIAGNOSIS — O24919 Unspecified diabetes mellitus in pregnancy, unspecified trimester: Secondary | ICD-10-CM | POA: Insufficient documentation

## 2013-08-10 DIAGNOSIS — E669 Obesity, unspecified: Secondary | ICD-10-CM | POA: Insufficient documentation

## 2013-08-10 DIAGNOSIS — Z8659 Personal history of other mental and behavioral disorders: Secondary | ICD-10-CM | POA: Insufficient documentation

## 2013-08-10 DIAGNOSIS — O24419 Gestational diabetes mellitus in pregnancy, unspecified control: Secondary | ICD-10-CM

## 2013-08-10 DIAGNOSIS — Z789 Other specified health status: Secondary | ICD-10-CM | POA: Insufficient documentation

## 2013-08-15 ENCOUNTER — Encounter: Payer: Medicaid Other | Attending: Family Medicine | Admitting: *Deleted

## 2013-08-15 ENCOUNTER — Encounter: Payer: Self-pay | Admitting: Family Medicine

## 2013-08-15 ENCOUNTER — Ambulatory Visit (INDEPENDENT_AMBULATORY_CARE_PROVIDER_SITE_OTHER): Payer: Self-pay | Admitting: Family Medicine

## 2013-08-15 VITALS — BP 103/68 | HR 66 | Temp 98.0°F | Ht 62.2 in | Wt 177.7 lb

## 2013-08-15 DIAGNOSIS — O99810 Abnormal glucose complicating pregnancy: Secondary | ICD-10-CM | POA: Insufficient documentation

## 2013-08-15 DIAGNOSIS — Z713 Dietary counseling and surveillance: Secondary | ICD-10-CM | POA: Insufficient documentation

## 2013-08-15 DIAGNOSIS — O0990 Supervision of high risk pregnancy, unspecified, unspecified trimester: Secondary | ICD-10-CM

## 2013-08-15 LAB — POCT URINALYSIS DIP (DEVICE)
Bilirubin Urine: NEGATIVE
GLUCOSE, UA: NEGATIVE mg/dL
Hgb urine dipstick: NEGATIVE
Ketones, ur: NEGATIVE mg/dL
Leukocytes, UA: NEGATIVE
Nitrite: NEGATIVE
Protein, ur: NEGATIVE mg/dL
SPECIFIC GRAVITY, URINE: 1.01 (ref 1.005–1.030)
Urobilinogen, UA: 0.2 mg/dL (ref 0.0–1.0)
pH: 7 (ref 5.0–8.0)

## 2013-08-15 LAB — HEMOGLOBIN A1C
Hgb A1c MFr Bld: 5.5 % (ref <5.7)
Mean Plasma Glucose: 111 mg/dL (ref <117)

## 2013-08-15 LAB — TSH: TSH: 4.334 u[IU]/mL (ref 0.350–4.500)

## 2013-08-15 MED ORDER — ASPIRIN 81 MG PO CHEW: 81.0000 mg | CHEWABLE_TABLET | Freq: Every day | ORAL | Status: DC

## 2013-08-15 NOTE — Progress Notes (Signed)
Transfer from HD Needs baseline labs, 24 hour urine Anatomy u/s, fetal ECHO, optho exam To see DM educator to day and begin diet and 4x/day testing. Started Baby ASA See next week to see how BS look

## 2013-08-15 NOTE — Patient Instructions (Signed)
Diabetes mellitus gestacional (Gestational Diabetes Mellitus) La diabetes mellitus gestacional, ms comnmente conocida como diabetes gestacional es un tipo de diabetes que desarrollan algunas mujeres durante el embarazo. En la diabetes gestacional, el pncreas no produce suficiente insulina (una hormona) o las clulas son menos sensibles a la insulina producida (resistencia a la insulina), o ambas cosas. Normalmente, la insulina mueve los azcares de los alimentos a las clulas de los tejidos. Las clulas de los tejidos utilizan los azcares para obtener energa. La falta de insulina o la falta de una respuesta normal a la insulina hace que el exceso de azcar se acumule en la sangre en lugar de penetrar en las clulas de los tejidos. Como resultado, se producen niveles altos de azcar en la sangre (hiperglucemia). El efecto de los niveles altos de azcar (glucosa) puede causar muchos problemas.  FACTORES DE RIESGO Usted tiene mayor probabilidad de desarrollar diabetes gestacional si tiene antecedentes familiares de diabetes y tambin si tiene uno o ms de los siguientes factores de riesgo:  ndice de masa corporal superior a 30 (obesidad).  Embarazo previo con diabetes gestacional.  La edad avanzada en el momento del embarazo. Si se mantienen los niveles de glucosa en la sangre en un rango normal durante el embarazo, las mujeres pueden tener un embarazo saludable. Si los niveles de glucosa en la sangre no estn bien controlados, puede haber riesgos para usted, el feto o el recin nacido, o durante el trabajo de parto y el parto.  SNTOMAS  Si se presentan sntomas, stos son similares a los sntomas que normalmente experimentar durante el embarazo. Los sntomas de la diabetes gestacional son:   Aumento de la sed (polidipsia).  Aumento de la miccin (poliuria).  Orina con ms frecuencia durante la noche (nocturia).  Prdida de peso. La prdida de peso puede ser muy rpida.  Infecciones  frecuentes y recurrentes.  Cansancio (fatiga).  Debilidad.  Cambios en la visin, como visin borrosa.  Olor a fruta en el aliento.  Dolor abdominal. DIAGNSTICO La diabetes se diagnostica cuando hay aumento de los niveles de glucosa en la sangre. El nivel de glucosa en la sangre puede controlarse en uno o ms de los siguientes anlisis de sangre:  Medicin de glucosa en la sangre en ayunas. No se le permitir comer durante al menos 8 horas antes de que se tome una muestra de sangre.  Pruebas al azar de glucosa en la sangre. El nivel de glucosa en la sangre se controla en cualquier momento del da sin importar el momento en que haya comido.  Prueba de A1c (hemoglobina glucosilada) Una prueba de A1c proporciona informacin sobre el control de la glucosa en la sangre durante los ltimos 3 meses.  Prueba de tolerancia a la glucosa oral (PTGO). La glucosa en la sangre se mide despus de no haber comido (ayunas) durante una a tres horas y despus de beber una bebida que contenga glucosa. Dado que las hormonas que causan la resistencia a la insulina son ms altas alrededor de las semanas 24 a 28 de embarazo, generalmente se realiza una PTGO durante ese tiempo. Si tiene factores de riesgo de diabetes gestacional, su mdico puede hacerle estudios de deteccin antes de las 24semanas de embarazo. TRATAMIENTO   Usted tendr que tomar medicamentos para la diabetes o insulina diariamente para mantener los niveles de glucosa en la sangre en el rango deseado.  Usted tendr que combinar la dosis de insulina con la actividad fsica y la eleccin de alimentos saludables. El objetivo del   tratamiento es mantener el nivel de azcar en la sangre previo a comer (preprandial) y durante la noche entre 60 y 99mg/dl, durante todo el embarazo. El objetivo del tratamiento es mantener el nivel pico de azcar en la sangre despus de comer (glucosa posprandial) entre 100y 140mg/dl. INSTRUCCIONES PARA EL CUIDADO EN EL  HOGAR   Controle su nivel de hemoglobina A1c dos veces al ao.  Contrlese a diario el nivel de glucosa en la sangre segn las indicaciones de su mdico. Es comn realizar controles frecuentes de la glucosa en la sangre.  Supervise las cetonas en la orina cuando est enferma y segn las indicaciones de su mdico.  Tome el medicamento para la diabetes y adminstrese insulina segn las indicaciones de su mdico para mantener el nivel de glucosa en la sangre en el rango deseado.  Nunca se quede sin medicamento para la diabetes o sin insulina. Es necesario que la reciba todos los das.  Ajuste la insulina segn la ingesta de hidratos de carbono. Los hidratos de carbono pueden aumentar los niveles de glucosa en la sangre, pero deben incluirse en su dieta. Los hidratos de carbono aportan vitaminas, minerales y fibra que son una parte esencial de una dieta saludable. Los hidratos de carbono se encuentran en frutas, verduras, cereales integrales, productos lcteos, legumbres y alimentos que contienen azcares aadidos.  Consuma alimentos saludables. Alterne 3 comidas con 3 colaciones.  Aumente de peso saludablemente. El aumento del peso total vara de acuerdo con el ndice de masa corporal que tena antes del embarazo ([facility]).  Lleve una tarjeta de alerta mdica o use una pulsera o medalla de alerta mdica.  Lleve con usted una colacin de 15gramos de hidratos de carbono en todo momento para controlar los niveles bajos de glucosa en la sangre (hipoglucemia). Algunos ejemplos de colaciones de 15gramos de hidratos de carbono son los siguientes:  Tabletas de glucosa, 3 o 4.  Gel de glucosa, tubo de 15 gramos.  Pasas de uva, 2 cucharadas (24 g).  Caramelos de goma, 6.  Galletas de animales, 8.  Jugo de fruta, gaseosa comn, o leche descremada, 4 onzas (120 ml).  Pastillas de goma, 9.  Reconocer la hipoglucemia. Durante el embarazo la hipoglucemia se produce cuando hay niveles de glucosa en la  sangre de 60 mg/dl o menos. El riesgo de hipoglucemia aumenta durante el ayuno o cuando se saltea las comidas, durante o despus de realizar ejercicio intenso y mientras duerme. Los sntomas de hipoglucemia son:  Temblores o sacudidas.  Disminucin de la capacidad de concentracin.  Sudoracin.  Aumento de la frecuencia cardaca.  Dolor de cabeza.  Sequedad en la boca.  Hambre.  Irritabilidad.  Ansiedad.  Sueo agitado.  Alteracin del habla o de la coordinacin.  Confusin.  Tratar la hipoglucemia rpidamente. Si usted est alerta y puede tragar con seguridad, siga la regla de 15/15 que consiste en:  Tome entre 15 y 20gramos de glucosa de accin rpida o carbohidratos. Las opciones de accin rpida son un gel de glucosa, tabletas de glucosa, o 4 onzas (120 ml) de jugo de frutas, gaseosa comn, o leche baja en grasa.  Compruebe su nivel de glucosa en la sangre 15 minutos despus de tomar la glucosa.  Tome entre 15 y 20 gramos ms de glucosa si el nivel de glucosa en la sangre todava es de 70mg/dl o inferior.  Ingiera una comida o una colacin en el lapso de 1 hora una vez que los niveles de glucosa en la sangre vuelven   a la normalidad.  Est atento a la poliuria (miccin excesiva) y la polidipsia (sensacin de mucha sed), que son los primeros signos de la hiperglucemia. El reconocimiento temprano de la hiperglucemia permite un tratamiento oportuno. Trate la hiperglucemia segn le indic su mdico.  Haga actividad fsica por lo menos 30minutos al da o como lo indique su mdico. Se recomienda que 30 minutos despus de cada comida, realice diez minutos de actividad fsica para controlar los niveles de glucosa postprandial en la sangre.  Ajuste su dosis de insulina y la ingesta de alimentos, segn sea necesario, si inicia un nuevo ejercicio o deporte.  Siga su plan para los das de enfermedad cuando no pueda comer o beber como de costumbre.  Evite el tabaco y el  alcohol.  Concurra a todas las visitas de control como se lo haya indicado el mdico.  Siga el consejo del mdico respecto a los controles prenatales y posteriores al parto (postparto), las visitas, la planificacin de las comidas, el ejercicio, los medicamentos, las vitaminas, los anlisis de sangre, otras pruebas mdicas y actividades fsicas.  Realice diariamente el cuidado de la piel y de los pies. Examine su piel y los pies diariamente para ver si tiene cortes, moretones, enrojecimiento, problemas en las uas, sangrado, ampollas o llagas.  Cepllese los dientes y encas por lo menos dos veces al da y use hilo dental al menos una vez por da. Concurra regularmente a las visitas de control con el dentista.  Programe un examen de vista durante el primer trimestre de su embarazo o como lo indique su mdico.  Comparta su plan de control de diabetes en el trabajo o en la escuela.  Mantngase al da con las vacunas.  Aprenda a manejar el estrs.  Obtenga la mayor cantidad posible de informacin sobre la diabetes y solicite ayuda siempre que sea necesario.  Obtenga informacin sobre el amamantamiento y analice esta posibilidad.  Debe controlar el nivel de azcar en la sangre de 6a 12semanas despus del parto. Esto se hace con una prueba de tolerancia a la glucosa oral (PTGO). SOLICITE ATENCIN MDICA SI:   No puede comer alimentos o beber por ms de 6 horas.  Tuvo nuseas o ha vomitado durante ms de 6 horas.  Tiene un nivel de glucosa en la sangre de 200 mg/dl y cetonas en la orina.  Presenta algn cambio en el estado mental.  Desarrolla problemas de visin.  Sufre un dolor persistente de cabeza.  Siente dolor o molestias en la parte superior del abdomen.  Desarrolla una enfermedad grave adicional.  Tuvo diarrea durante ms de 6 horas.  Ha estado enfermo o ha tenido fiebre durante un par de das y no mejora. SOLICITE ATENCIN MDICA DE INMEDIATO SI:   Tiene dificultad  para respirar.  Ya no siente los movimientos del beb.  Est sangrando o tiene flujo vaginal.  Comienza a tener contracciones o trabajo de parto prematuro. ASEGRESE DE QUE:  Comprende estas instrucciones.  Controlar su afeccin.  Recibir ayuda de inmediato si no mejora o si empeora. Document Released: 10/16/2004 Document Revised: 05/23/2013 [employer] Patient Information 2015 [employer]. This information is not intended to replace advice given to you by your health care provider. Make sure you discuss any questions you have with your health care provider.  Lactancia materna (Breastfeeding) Decidir amamantar es una de las mejores elecciones que puede hacer por usted y su beb. El cambio hormonal durante el embarazo produce el desarrollo del tejido mamario y aumenta la cantidad   y el tamao de los conductos galactforos. Estas hormonas tambin permiten que las protenas, los azcares y las grasas de la sangre produzcan la leche materna en las glndulas productoras de leche. Las hormonas impiden que la leche materna sea liberada antes del nacimiento del beb, adems de impulsar el flujo de leche luego del nacimiento. Una vez que ha comenzado a amamantar, pensar en el beb, as como la succin o el llanto, pueden estimular la liberacin de leche de las glndulas productoras de leche.  LOS BENEFICIOS DE AMAMANTAR Para el beb  La primera leche (calostro) ayuda a mejorar el funcionamiento del sistema digestivo del beb.  La leche tiene anticuerpos que ayudan a prevenir las infecciones en el beb.  El beb tiene una menor incidencia de asma, alergias y del sndrome de muerte sbita del lactante.  Los nutrientes en la leche materna son mejores para el beb que la leche maternizada y estn preparados exclusivamente para cubrir las necesidades del beb.  La leche materna mejora el desarrollo cerebral del beb.  Es menos probable que el beb desarrolle otras enfermedades, como obesidad  infantil, asma o diabetes mellitus de tipo 2. Para usted   La lactancia materna favorece el desarrollo de un vnculo muy especial entre la madre y el beb.  Es conveniente. La leche materna siempre est disponible a la temperatura correcta y es econmica.  La lactancia materna ayuda a quemar caloras y a perder el peso ganado durante el embarazo.  Favorece la contraccin del tero al tamao que tena antes del embarazo de manera ms rpida y disminuye el sangrado (loquios) despus del parto.  La lactancia materna contribuye a reducir el riesgo de desarrollar diabetes mellitus de tipo 2, osteoporosis o cncer de mama o de ovario en el futuro. SIGNOS DE QUE EL BEB EST HAMBRIENTO Primeros signos de hambre  Aumenta su estado de alerta o actividad.  Se estira.  Mueve la cabeza de un lado a otro.  Mueve la cabeza y abre la boca cuando se le toca la mejilla o la comisura de la boca (reflejo de bsqueda).  Aumenta las vocalizaciones, tales como sonidos de succin, se relame los labios, emite arrullos, suspiros, o chirridos.  Mueve la mano hacia la boca.  Se chupa con ganas los dedos o las manos. Signos tardos de hambre  Est agitado.  Llora de manera intermitente. Signos de hambre extrema Los signos de hambre extrema requerirn que lo calme y lo consuele antes de que el beb pueda alimentarse adecuadamente. No espere a que se manifiesten los siguientes signos de hambre extrema para comenzar a amamantar:   Agitacin.  Llanto intenso y fuerte.   Gritos. INFORMACIN BSICA SOBRE LA LACTANCIA MATERNA Iniciacin de la lactancia materna  Encuentre un lugar cmodo para sentarse o acostarse, con un buen respaldo para el cuello y la espalda.  Coloque una almohada o una manta enrollada debajo del beb para acomodarlo a la altura de la mama (si est sentada). Las almohadas para amamantar se han diseado especialmente a fin de servir de apoyo para los brazos y el beb mientras  amamanta.  Asegrese de que el abdomen del beb est frente al suyo.  Masajee suavemente la mama. Con las yemas de los dedos, masajee la pared del pecho hacia el pezn en un movimiento circular. Esto estimula el flujo de leche. Es posible que deba continuar este movimiento mientras amamanta si la leche fluye lentamente.  Sostenga la mama con el pulgar por arriba del pezn y los otros   4 dedos por debajo de la mama. Asegrese de que los dedos se encuentren lejos del pezn y de la boca del beb.  Empuje suavemente los labios del beb con el pezn o con el dedo.  Cuando la boca del beb se abra lo suficiente, acrquelo rpidamente a la mama e introduzca todo el pezn y la zona oscura que lo rodea (areola), tanto como sea posible, dentro de la boca del beb.  Debe haber ms areola visible por arriba del labio superior del beb que por debajo del labio inferior.  La lengua del beb debe estar entre la enca inferior y la mama.  Asegrese de que la boca del beb est en la posicin correcta alrededor del pezn (prendida). Los labios del beb deben crear un sello sobre la mama y estar doblados hacia afuera (invertidos).  Es comn que el beb succione durante 2 a 3 minutos para que comience el flujo de leche materna. Cmo debe prenderse Es muy importante que le ensee al beb cmo prenderse adecuadamente a la mama. Si el beb no se prende adecuadamente, puede causarle dolor en el pezn y reducir la produccin de leche materna, y hacer que el beb tenga un escaso aumento de peso. Adems, si el beb no se prende adecuadamente al pezn, puede tragar aire durante la alimentacin. Esto puede causarle molestias al beb. Hacer eructar al beb al cambiar de mama puede ayudarlo a liberar el aire. Sin embargo, ensearle al beb cmo prenderse a la mama adecuadamente es la mejor manera de evitar que se sienta molesto por tragar aire mientras se alimenta. Signos de que el beb se ha prendido adecuadamente al pezn:    Tironea o succiona de modo silencioso, sin causarle dolor.  Se escucha que traga cada 3 o 4 succiones.   Hay movimientos musculares por arriba y por delante de sus odos al succionar. Signos de que el beb no se ha prendido adecuadamente al pezn:   Hace ruidos de succin o de chasquido mientras se alimenta.  Siente dolor en el pezn. Si cree que el beb no se prendi correctamente, deslice el dedo en la comisura de la boca y colquelo entre las encas del beb para interrumpir la succin. Intente comenzar a amamantar nuevamente. Signos de lactancia materna exitosa Signos del beb:   Disminuye gradualmente el nmero de succiones o cesa la succin por completo.  Se duerme.  Relaja el cuerpo.  Retiene una pequea cantidad de leche en la boca.  Se desprende solo del pecho. Signos que presenta usted:  Las mamas han aumentado la firmeza, el peso y el tamao 1 a 3 horas despus de amamantar.  Estn ms blandas inmediatamente despus de amamantar.  Un aumento del volumen de leche, y tambin un cambio en su consistencia y color se producen hacia el quinto da de lactancia materna.  Los pezones no duelen, ni estn agrietados ni sangran. Signos de que su beb recibe la cantidad de leche suficiente  Moja al menos 3 paales en 24 horas. La orina debe ser clara y de color amarillo plido a los 5 das de vida.  Defeca al menos 3 veces en 24 horas a los 5 das de vida. La materia fecal debe ser blanda y amarillenta.  Defeca al menos 3 veces en 24 horas a los 7 das de vida. La materia fecal debe ser grumosa y amarillenta.  No registra una prdida de peso mayor del 10% del peso al nacer durante los primeros 3 das de vida.    Aumenta de peso un promedio de 4 a 7onzas (113 a 198g) por semana despus de los 4 das de vida.  Aumenta de peso, diariamente, de manera uniforme a partir de los 5 das de vida, sin registrar prdida de peso despus de las 2semanas de vida. Despus de  alimentarse, es posible que el beb regurgite una pequea cantidad. Esto es frecuente. FRECUENCIA Y DURACIN DE LA LACTANCIA MATERNA El amamantamiento frecuente la ayudar a producir ms leche y a prevenir problemas de dolor en los pezones e hinchazn en las mamas. Alimente al beb cuando muestre signos de hambre o si siente la necesidad de reducir la congestin de las mamas. Esto se denomina "lactancia a demanda". Evite el uso del chupete mientras trabaja para establecer la lactancia (las primeras 4 a 6 semanas despus del nacimiento del beb). Despus de este perodo, podr ofrecerle un chupete. Las investigaciones demostraron que el uso del chupete durante el primer ao de vida del beb disminuye el riesgo de desarrollar el sndrome de muerte sbita del lactante (SMSL). Permita que el nio se alimente en cada mama todo lo que desee. Contine amamantando al beb hasta que haya terminado de alimentarse. Cuando el beb se desprende o se queda dormido mientras se est alimentando de la primera mama, ofrzcale la segunda. Debido a que, con frecuencia, los recin nacidos permanecen somnolientos las primeras semanas de vida, es posible que deba despertar al beb para alimentarlo. Los horarios de lactancia varan de un beb a otro. Sin embargo, las siguientes reglas pueden servir como gua para ayudarla a garantizar que el beb se alimenta adecuadamente:  Se puede amamantar a los recin nacidos (bebs de 4 semanas o menos de vida) cada 1 a 3 horas.  No deben transcurrir ms de 3 horas durante el da o 5 horas durante la noche sin que se amamante a los recin nacidos.  Debe amamantar al beb 8 veces como mnimo en un perodo de 24 horas, hasta que comience a introducir slidos en su dieta, a los 6 meses de vida aproximadamente. EXTRACCIN DE LECHE MATERNA La extraccin y el almacenamiento de la leche materna le permiten asegurarse de que el beb se alimente exclusivamente de leche materna, aun en momentos en  los que no puede amamantar. Esto tiene especial importancia si debe regresar al trabajo en el perodo en que an est amamantando o si no puede estar presente en los momentos en que el beb debe alimentarse. Su asesor en lactancia puede orientarla sobre cunto tiempo es seguro almacenar leche materna.  El sacaleche es un aparato que le permite extraer leche de la mama a un recipiente estril. Luego, la leche materna extrada puede almacenarse en un refrigerador o congelador. Algunos sacaleches son manuales, mientras que otros son elctricos. Consulte a su asesor en lactancia qu tipo ser ms conveniente para usted. Los sacaleches se pueden comprar; sin embargo, algunos hospitales y grupos de apoyo a la lactancia materna alquilan sacaleches mensualmente. Un asesor en lactancia puede ensearle cmo extraer leche materna manualmente, en caso de que prefiera no usar un sacaleche.  CMO CUIDAR LAS MAMAS DURANTE LA LACTANCIA MATERNA Los pezones se secan, agrietan y duelen durante la lactancia materna. Las siguientes recomendaciones pueden ayudarla a mantener las mamas humectadas y sanas:  Evite usar jabn en los pezones.  Use un sostn de soporte. Aunque no son esenciales, las camisetas sin mangas o los sostenes especiales para amamantar estn diseados para acceder fcilmente a las mamas, para amamantar sin tener que quitarse todo   el sostn o la camiseta. Evite usar sostenes con aro o sostenes muy ajustados.  Seque al aire sus pezones durante 3 a 4minutos despus de amamantar al beb.  Utilice solo apsitos de algodn en el sostn para absorber las prdidas de leche. La prdida de un poco de leche materna entre las tomas es normal.  Utilice lanolina sobre los pezones luego de amamantar. La lanolina ayuda a mantener la humedad normal de la piel. Si usa lanolina pura, no tiene que lavarse los pezones antes de volver a alimentar al beb. La lanolina pura no es txica para el beb. Adems, puede extraer  manualmente algunas gotas de leche materna y masajear suavemente esa leche sobre los pezones, para que la leche se seque al aire. Durante las primeras semanas despus de dar a luz, algunas mujeres pueden experimentar hinchazn en las mamas (congestin mamaria). La congestin puede hacer que sienta las mamas pesadas, calientes y sensibles al tacto. El pico de la congestin ocurre dentro de los 3 a 5 das despus del parto. Las siguientes recomendaciones pueden ayudarla a aliviar la congestin:  Vace por completo las mamas al amamantar o extraer leche. Puede aplicar calor hmedo en las mamas (en la ducha o con toallas hmedas para manos) antes de amamantar o extraer leche. Esto aumenta la circulacin y ayuda a que la leche fluya. Si el beb no vaca por completo las mamas cuando lo amamanta, extraiga la leche restante despus de que haya finalizado.  Use un sostn ajustado (para amamantar o comn) o una camiseta sin mangas durante 1 o 2 das para indicar al cuerpo que disminuya ligeramente la produccin de leche.  Aplique compresas de hielo sobre las mamas, a menos que le resulte demasiado incmodo.  Asegrese de que el beb est prendido y se encuentre en la posicin correcta mientras lo alimenta. Si la congestin persiste luego de 48 horas o despus de seguir estas recomendaciones, comunquese con su mdico o un asesor en lactancia. RECOMENDACIONES GENERALES PARA EL CUIDADO DE LA SALUD DURANTE LA LACTANCIA MATERNA  Consuma alimentos saludables. Alterne comidas y colaciones, y coma 3 de cada una por da. Dado que lo que come afecta la leche materna, es posible que algunas comidas hagan que su beb se vuelva ms irritable de lo habitual. Evite comer este tipo de alimentos si percibe que afectan de manera negativa al beb.  Beba leche, jugos de fruta y agua para satisfacer su sed (aproximadamente 10 vasos al da).  Descanse con frecuencia, reljese y tome sus vitaminas prenatales para evitar la  fatiga, el estrs y la anemia.  Contine con los autocontroles de la mama.  Evite masticar y fumar tabaco.  Evite el consumo de alcohol y drogas. Algunos medicamentos, que pueden ser perjudiciales para el beb, pueden pasar a travs de la leche materna. Es importante que consulte a su mdico antes de tomar cualquier medicamento, incluidos todos los medicamentos recetados y de venta libre, as como los suplementos vitamnicos y herbales. Puede quedar embarazada durante la lactancia. Si desea controlar la natalidad, consulte a su mdico cules son las opciones ms seguras para el beb. SOLICITE ATENCIN MDICA SI:   Usted siente que quiere dejar de amamantar o se siente frustrada con la lactancia.  Siente dolor en las mamas o en los pezones.  Sus pezones estn agrietados o sangran.  Sus pechos estn irritados, sensibles o calientes.  Tiene un rea hinchada en cualquiera de las mamas.  Siente escalofros o fiebre.  Tiene nuseas o vmitos.    Presenta una secrecin de otro lquido distinto de la leche materna de los pezones.  Sus mamas no se llenan antes de amamantar al beb para el quinto da despus del parto.  Se siente triste y deprimida.  El beb est demasiado somnoliento como para comer bien.  El beb tiene problemas para dormir.  Moja menos de 3 paales en 24 horas.  Defeca menos de 3 veces en 24 horas.  La piel del beb o la parte [place] de los ojos se vuelven amarillentas.  El beb no ha aumentado de peso a los 5 das de vida. SOLICITE ATENCIN MDICA DE INMEDIATO SI:   El beb est muy cansado (letargo) y no se quiere despertar para comer.  Le sube la fiebre sin causa. Document Released: 01/06/2005 Document Revised: 01/11/2013 [employer] Patient Information 2015 [employer]. This information is not intended to replace advice given to you by your health care provider. Make sure you discuss any questions you have with your health care provider.  

## 2013-08-15 NOTE — Progress Notes (Signed)
Nutrition note: 1st visit consult & GDM diet education Pt is a newly diagnosed GDM pt. Pt has gained 7.7# @ [id number], which is wnl. Pt reports eating 2 meals & 3 snacks/d. Pt reports drinking 3-4 c of juice/d & coke 3-4 x/d as well as water & milk. Pt is taking a PNV. Pt reports some N/V-especially in the morning and occ heartburn. Pt reports walking for 30-40 mins 2x/wk. Pt received verbal & written education in Spanish via interpreter, Erby Pianlec about GDM diet. Discussed tips to decrease N/V. Encouraged decrease/ DC soda & juice. Discussed wt gain goals of 11-20# or 0.5#/wk. Pt agrees to follow GDM diet with 3 meals & 2-3 snacks/d with proper CHO/ protein combination. Pt has WIC & plans to BF. F/u in 4-6 wks Martha RevealLaura Zevin Nevares, MS, RD, LDN, [facility]

## 2013-08-15 NOTE — Progress Notes (Signed)
[facility]. All lab work up to date.  New OB packet given.

## 2013-08-15 NOTE — Progress Notes (Signed)
  Patient was seen on 08/15/13 for Gestational Diabetes self-management education. The following learning objectives were met by the patient :   States the definition of Gestational Diabetes  States why dietary management is important in controlling blood glucose  States when to check blood glucose levels  Demonstrates proper blood glucose monitoring techniques  Plan:  Consider  increasing your activity level by walking daily as tolerated Begin checking BG before breakfast and 2 hours after first bit of breakfast, lunch and dinner after  as directed by MD  Take medication  as directed by MD  Blood glucose monitor given: True Track, lancets, strips Blood glucose reading: FBS $RemoveBefo'129mg'yjMvkvDnKqe$ /dl  Patient instructed to monitor glucose levels: FBS: 60 - <90 2 hour: <120  Patient received the following handouts:  Nutrition Diabetes and Pregnancy  Patient will be seen for follow-up as needed.

## 2013-08-15 NOTE — Progress Notes (Signed)
Fetal Echo scheduled with Dr. Elizebeth Brookingotton on 09/01/13 @ 1400 Scheduled Opth[facility] @ 1000

## 2013-08-16 ENCOUNTER — Encounter: Payer: Self-pay | Admitting: Family Medicine

## 2013-08-17 ENCOUNTER — Encounter: Payer: Self-pay | Admitting: *Deleted

## 2013-08-18 ENCOUNTER — Ambulatory Visit (HOSPITAL_COMMUNITY)
Admission: RE | Admit: 2013-08-18 | Discharge: 2013-08-18 | Disposition: A | Discharge status: Home / Self Care | Payer: Medicaid Other | Source: Ambulatory Visit | Attending: Family Medicine | Admitting: Family Medicine

## 2013-08-18 ENCOUNTER — Ambulatory Visit (INDEPENDENT_AMBULATORY_CARE_PROVIDER_SITE_OTHER): Payer: Medicaid Other | Admitting: Home Health Services

## 2013-08-18 DIAGNOSIS — Z3689 Encounter for other specified antenatal screening: Secondary | ICD-10-CM | POA: Insufficient documentation

## 2013-08-18 DIAGNOSIS — O99810 Abnormal glucose complicating pregnancy: Secondary | ICD-10-CM

## 2013-08-18 NOTE — Progress Notes (Signed)
DIABETES Pt came in to have a retinal scan per diabetic care.   Image was taken and submitted to [facility]-DR. Garg for reading.    Results will be available in 1-2 weeks.  Results will be given to PCP for review and to contact patient.  Adelle Zachar  

## 2013-08-22 ENCOUNTER — Encounter: Payer: Self-pay | Admitting: Family Medicine

## 2013-08-22 ENCOUNTER — Ambulatory Visit (INDEPENDENT_AMBULATORY_CARE_PROVIDER_SITE_OTHER): Payer: Self-pay | Admitting: Family Medicine

## 2013-08-22 VITALS — BP 124/74 | HR 81 | Wt 177.3 lb

## 2013-08-22 DIAGNOSIS — O0990 Supervision of high risk pregnancy, unspecified, unspecified trimester: Secondary | ICD-10-CM

## 2013-08-22 DIAGNOSIS — Z789 Other specified health status: Secondary | ICD-10-CM

## 2013-08-22 DIAGNOSIS — O99810 Abnormal glucose complicating pregnancy: Secondary | ICD-10-CM

## 2013-08-22 LAB — POCT URINALYSIS DIP (DEVICE)
BILIRUBIN URINE: NEGATIVE
GLUCOSE, UA: NEGATIVE mg/dL
Ketones, ur: NEGATIVE mg/dL
NITRITE: NEGATIVE
Protein, ur: NEGATIVE mg/dL
SPECIFIC GRAVITY, URINE: 1.015 (ref 1.005–1.030)
Urobilinogen, UA: 0.2 mg/dL (ref 0.0–1.0)
pH: 7 (ref 5.0–8.0)

## 2013-08-22 MED ORDER — GLYBURIDE 2.5 MG PO TABS: 2.5000 mg | ORAL_TABLET | Freq: Two times a day (BID) | ORAL | Status: DC

## 2013-08-22 NOTE — Progress Notes (Signed)
Brought in 24 hr urine today- needs CMET drawn today

## 2013-08-22 NOTE — Patient Instructions (Signed)
Diabetes mellitus gestacional (Gestational Diabetes Mellitus) La diabetes mellitus gestacional, ms comnmente conocida como diabetes gestacional es un tipo de diabetes que desarrollan algunas mujeres durante el embarazo. En la diabetes gestacional, el pncreas no produce suficiente insulina (una hormona) o las clulas son menos sensibles a la insulina producida (resistencia a la insulina), o ambas cosas. Normalmente, la insulina mueve los azcares de los alimentos a las clulas de los tejidos. Las clulas de los tejidos utilizan los azcares para obtener energa. La falta de insulina o la falta de una respuesta normal a la insulina hace que el exceso de azcar se acumule en la sangre en lugar de penetrar en las clulas de los tejidos. Como resultado, se producen niveles altos de azcar en la sangre (hiperglucemia). El efecto de los niveles altos de azcar (glucosa) puede causar muchos problemas.  FACTORES DE RIESGO Usted tiene mayor probabilidad de desarrollar diabetes gestacional si tiene antecedentes familiares de diabetes y tambin si tiene uno o ms de los siguientes factores de riesgo:  ndice de masa corporal superior a 30 (obesidad).  Embarazo previo con diabetes gestacional.  La edad avanzada en el momento del embarazo. Si se mantienen los niveles de glucosa en la sangre en un rango normal durante el embarazo, las mujeres pueden tener un embarazo saludable. Si los niveles de glucosa en la sangre no estn bien controlados, puede haber riesgos para usted, el feto o el recin nacido, o durante el trabajo de parto y el parto.  SNTOMAS  Si se presentan sntomas, stos son similares a los sntomas que normalmente experimentar durante el embarazo. Los sntomas de la diabetes gestacional son:   Aumento de la sed (polidipsia).  Aumento de la miccin (poliuria).  Orina con ms frecuencia durante la noche (nocturia).  Prdida de peso. La prdida de peso puede ser muy rpida.  Infecciones  frecuentes y recurrentes.  Cansancio (fatiga).  Debilidad.  Cambios en la visin, como visin borrosa.  Olor a fruta en el aliento.  Dolor abdominal. DIAGNSTICO La diabetes se diagnostica cuando hay aumento de los niveles de glucosa en la sangre. El nivel de glucosa en la sangre puede controlarse en uno o ms de los siguientes anlisis de sangre:  Medicin de glucosa en la sangre en ayunas. No se le permitir comer durante al menos 8 horas antes de que se tome una muestra de sangre.  Pruebas al azar de glucosa en la sangre. El nivel de glucosa en la sangre se controla en cualquier momento del da sin importar el momento en que haya comido.  Prueba de A1c (hemoglobina glucosilada) Una prueba de A1c proporciona informacin sobre el control de la glucosa en la sangre durante los ltimos 3 meses.  Prueba de tolerancia a la glucosa oral (PTGO). La glucosa en la sangre se mide despus de no haber comido (ayunas) durante una a tres horas y despus de beber una bebida que contenga glucosa. Dado que las hormonas que causan la resistencia a la insulina son ms altas alrededor de las semanas 24 a 28 de embarazo, generalmente se realiza una PTGO durante ese tiempo. Si tiene factores de riesgo de diabetes gestacional, su mdico puede hacerle estudios de deteccin antes de las 24semanas de embarazo. TRATAMIENTO   Usted tendr que tomar medicamentos para la diabetes o insulina diariamente para mantener los niveles de glucosa en la sangre en el rango deseado.  Usted tendr que combinar la dosis de insulina con la actividad fsica y la eleccin de alimentos saludables. El objetivo del   tratamiento es mantener el nivel de azcar en la sangre previo a comer (preprandial) y durante la noche entre 60 y 99mg/dl, durante todo el embarazo. El objetivo del tratamiento es mantener el nivel pico de azcar en la sangre despus de comer (glucosa posprandial) entre 100y 140mg/dl. INSTRUCCIONES PARA EL CUIDADO EN EL  HOGAR   Controle su nivel de hemoglobina A1c dos veces al ao.  Contrlese a diario el nivel de glucosa en la sangre segn las indicaciones de su mdico. Es comn realizar controles frecuentes de la glucosa en la sangre.  Supervise las cetonas en la orina cuando est enferma y segn las indicaciones de su mdico.  Tome el medicamento para la diabetes y adminstrese insulina segn las indicaciones de su mdico para mantener el nivel de glucosa en la sangre en el rango deseado.  Nunca se quede sin medicamento para la diabetes o sin insulina. Es necesario que la reciba todos los das.  Ajuste la insulina segn la ingesta de hidratos de carbono. Los hidratos de carbono pueden aumentar los niveles de glucosa en la sangre, pero deben incluirse en su dieta. Los hidratos de carbono aportan vitaminas, minerales y fibra que son una parte esencial de una dieta saludable. Los hidratos de carbono se encuentran en frutas, verduras, cereales integrales, productos lcteos, legumbres y alimentos que contienen azcares aadidos.  Consuma alimentos saludables. Alterne 3 comidas con 3 colaciones.  Aumente de peso saludablemente. El aumento del peso total vara de acuerdo con el ndice de masa corporal que tena antes del embarazo ([facility]).  Lleve una tarjeta de alerta mdica o use una pulsera o medalla de alerta mdica.  Lleve con usted una colacin de 15gramos de hidratos de carbono en todo momento para controlar los niveles bajos de glucosa en la sangre (hipoglucemia). Algunos ejemplos de colaciones de 15gramos de hidratos de carbono son los siguientes:  Tabletas de glucosa, 3 o 4.  Gel de glucosa, tubo de 15 gramos.  Pasas de uva, 2 cucharadas (24 g).  Caramelos de goma, 6.  Galletas de animales, 8.  Jugo de fruta, gaseosa comn, o leche descremada, 4 onzas (120 ml).  Pastillas de goma, 9.  Reconocer la hipoglucemia. Durante el embarazo la hipoglucemia se produce cuando hay niveles de glucosa en la  sangre de 60 mg/dl o menos. El riesgo de hipoglucemia aumenta durante el ayuno o cuando se saltea las comidas, durante o despus de realizar ejercicio intenso y mientras duerme. Los sntomas de hipoglucemia son:  Temblores o sacudidas.  Disminucin de la capacidad de concentracin.  Sudoracin.  Aumento de la frecuencia cardaca.  Dolor de cabeza.  Sequedad en la boca.  Hambre.  Irritabilidad.  Ansiedad.  Sueo agitado.  Alteracin del habla o de la coordinacin.  Confusin.  Tratar la hipoglucemia rpidamente. Si usted est alerta y puede tragar con seguridad, siga la regla de 15/15 que consiste en:  Tome entre 15 y 20gramos de glucosa de accin rpida o carbohidratos. Las opciones de accin rpida son un gel de glucosa, tabletas de glucosa, o 4 onzas (120 ml) de jugo de frutas, gaseosa comn, o leche baja en grasa.  Compruebe su nivel de glucosa en la sangre 15 minutos despus de tomar la glucosa.  Tome entre 15 y 20 gramos ms de glucosa si el nivel de glucosa en la sangre todava es de 70mg/dl o inferior.  Ingiera una comida o una colacin en el lapso de 1 hora una vez que los niveles de glucosa en la sangre vuelven   a la normalidad.  Est atento a la poliuria (miccin excesiva) y la polidipsia (sensacin de mucha sed), que son los primeros signos de la hiperglucemia. El reconocimiento temprano de la hiperglucemia permite un tratamiento oportuno. Trate la hiperglucemia segn le indic su mdico.  Haga actividad fsica por lo menos 30minutos al da o como lo indique su mdico. Se recomienda que 30 minutos despus de cada comida, realice diez minutos de actividad fsica para controlar los niveles de glucosa postprandial en la sangre.  Ajuste su dosis de insulina y la ingesta de alimentos, segn sea necesario, si inicia un nuevo ejercicio o deporte.  Siga su plan para los das de enfermedad cuando no pueda comer o beber como de costumbre.  Evite el tabaco y el  alcohol.  Concurra a todas las visitas de control como se lo haya indicado el mdico.  Siga el consejo del mdico respecto a los controles prenatales y posteriores al parto (postparto), las visitas, la planificacin de las comidas, el ejercicio, los medicamentos, las vitaminas, los anlisis de sangre, otras pruebas mdicas y actividades fsicas.  Realice diariamente el cuidado de la piel y de los pies. Examine su piel y los pies diariamente para ver si tiene cortes, moretones, enrojecimiento, problemas en las uas, sangrado, ampollas o llagas.  Cepllese los dientes y encas por lo menos dos veces al da y use hilo dental al menos una vez por da. Concurra regularmente a las visitas de control con el dentista.  Programe un examen de vista durante el primer trimestre de su embarazo o como lo indique su mdico.  Comparta su plan de control de diabetes en el trabajo o en la escuela.  Mantngase al da con las vacunas.  Aprenda a manejar el estrs.  Obtenga la mayor cantidad posible de informacin sobre la diabetes y solicite ayuda siempre que sea necesario.  Obtenga informacin sobre el amamantamiento y analice esta posibilidad.  Debe controlar el nivel de azcar en la sangre de 6a 12semanas despus del parto. Esto se hace con una prueba de tolerancia a la glucosa oral (PTGO). SOLICITE ATENCIN MDICA SI:   No puede comer alimentos o beber por ms de 6 horas.  Tuvo nuseas o ha vomitado durante ms de 6 horas.  Tiene un nivel de glucosa en la sangre de 200 mg/dl y cetonas en la orina.  Presenta algn cambio en el estado mental.  Desarrolla problemas de visin.  Sufre un dolor persistente de cabeza.  Siente dolor o molestias en la parte superior del abdomen.  Desarrolla una enfermedad grave adicional.  Tuvo diarrea durante ms de 6 horas.  Ha estado enfermo o ha tenido fiebre durante un par de das y no mejora. SOLICITE ATENCIN MDICA DE INMEDIATO SI:   Tiene dificultad  para respirar.  Ya no siente los movimientos del beb.  Est sangrando o tiene flujo vaginal.  Comienza a tener contracciones o trabajo de parto prematuro. ASEGRESE DE QUE:  Comprende estas instrucciones.  Controlar su afeccin.  Recibir ayuda de inmediato si no mejora o si empeora. Document Released: 10/16/2004 Document Revised: 05/23/2013 [employer] Patient Information 2015 [employer]. This information is not intended to replace advice given to you by your health care provider. Make sure you discuss any questions you have with your health care provider.  Lactancia materna (Breastfeeding) Decidir amamantar es una de las mejores elecciones que puede hacer por usted y su beb. El cambio hormonal durante el embarazo produce el desarrollo del tejido mamario y aumenta la cantidad   y el tamao de los conductos galactforos. Estas hormonas tambin permiten que las protenas, los azcares y las grasas de la sangre produzcan la leche materna en las glndulas productoras de leche. Las hormonas impiden que la leche materna sea liberada antes del nacimiento del beb, adems de impulsar el flujo de leche luego del nacimiento. Una vez que ha comenzado a amamantar, pensar en el beb, as como la succin o el llanto, pueden estimular la liberacin de leche de las glndulas productoras de leche.  LOS BENEFICIOS DE AMAMANTAR Para el beb  La primera leche (calostro) ayuda a mejorar el funcionamiento del sistema digestivo del beb.  La leche tiene anticuerpos que ayudan a prevenir las infecciones en el beb.  El beb tiene una menor incidencia de asma, alergias y del sndrome de muerte sbita del lactante.  Los nutrientes en la leche materna son mejores para el beb que la leche maternizada y estn preparados exclusivamente para cubrir las necesidades del beb.  La leche materna mejora el desarrollo cerebral del beb.  Es menos probable que el beb desarrolle otras enfermedades, como obesidad  infantil, asma o diabetes mellitus de tipo 2. Para usted   La lactancia materna favorece el desarrollo de un vnculo muy especial entre la madre y el beb.  Es conveniente. La leche materna siempre est disponible a la temperatura correcta y es econmica.  La lactancia materna ayuda a quemar caloras y a perder el peso ganado durante el embarazo.  Favorece la contraccin del tero al tamao que tena antes del embarazo de manera ms rpida y disminuye el sangrado (loquios) despus del parto.  La lactancia materna contribuye a reducir el riesgo de desarrollar diabetes mellitus de tipo 2, osteoporosis o cncer de mama o de ovario en el futuro. SIGNOS DE QUE EL BEB EST HAMBRIENTO Primeros signos de hambre  Aumenta su estado de alerta o actividad.  Se estira.  Mueve la cabeza de un lado a otro.  Mueve la cabeza y abre la boca cuando se le toca la mejilla o la comisura de la boca (reflejo de bsqueda).  Aumenta las vocalizaciones, tales como sonidos de succin, se relame los labios, emite arrullos, suspiros, o chirridos.  Mueve la mano hacia la boca.  Se chupa con ganas los dedos o las manos. Signos tardos de hambre  Est agitado.  Llora de manera intermitente. Signos de hambre extrema Los signos de hambre extrema requerirn que lo calme y lo consuele antes de que el beb pueda alimentarse adecuadamente. No espere a que se manifiesten los siguientes signos de hambre extrema para comenzar a amamantar:   Agitacin.  Llanto intenso y fuerte.   Gritos. INFORMACIN BSICA SOBRE LA LACTANCIA MATERNA Iniciacin de la lactancia materna  Encuentre un lugar cmodo para sentarse o acostarse, con un buen respaldo para el cuello y la espalda.  Coloque una almohada o una manta enrollada debajo del beb para acomodarlo a la altura de la mama (si est sentada). Las almohadas para amamantar se han diseado especialmente a fin de servir de apoyo para los brazos y el beb mientras  amamanta.  Asegrese de que el abdomen del beb est frente al suyo.  Masajee suavemente la mama. Con las yemas de los dedos, masajee la pared del pecho hacia el pezn en un movimiento circular. Esto estimula el flujo de leche. Es posible que deba continuar este movimiento mientras amamanta si la leche fluye lentamente.  Sostenga la mama con el pulgar por arriba del pezn y los otros   4 dedos por debajo de la mama. Asegrese de que los dedos se encuentren lejos del pezn y de la boca del beb.  Empuje suavemente los labios del beb con el pezn o con el dedo.  Cuando la boca del beb se abra lo suficiente, acrquelo rpidamente a la mama e introduzca todo el pezn y la zona oscura que lo rodea (areola), tanto como sea posible, dentro de la boca del beb.  Debe haber ms areola visible por arriba del labio superior del beb que por debajo del labio inferior.  La lengua del beb debe estar entre la enca inferior y la mama.  Asegrese de que la boca del beb est en la posicin correcta alrededor del pezn (prendida). Los labios del beb deben crear un sello sobre la mama y estar doblados hacia afuera (invertidos).  Es comn que el beb succione durante 2 a 3 minutos para que comience el flujo de leche materna. Cmo debe prenderse Es muy importante que le ensee al beb cmo prenderse adecuadamente a la mama. Si el beb no se prende adecuadamente, puede causarle dolor en el pezn y reducir la produccin de leche materna, y hacer que el beb tenga un escaso aumento de peso. Adems, si el beb no se prende adecuadamente al pezn, puede tragar aire durante la alimentacin. Esto puede causarle molestias al beb. Hacer eructar al beb al cambiar de mama puede ayudarlo a liberar el aire. Sin embargo, ensearle al beb cmo prenderse a la mama adecuadamente es la mejor manera de evitar que se sienta molesto por tragar aire mientras se alimenta. Signos de que el beb se ha prendido adecuadamente al pezn:    Tironea o succiona de modo silencioso, sin causarle dolor.  Se escucha que traga cada 3 o 4 succiones.   Hay movimientos musculares por arriba y por delante de sus odos al succionar. Signos de que el beb no se ha prendido adecuadamente al pezn:   Hace ruidos de succin o de chasquido mientras se alimenta.  Siente dolor en el pezn. Si cree que el beb no se prendi correctamente, deslice el dedo en la comisura de la boca y colquelo entre las encas del beb para interrumpir la succin. Intente comenzar a amamantar nuevamente. Signos de lactancia materna exitosa Signos del beb:   Disminuye gradualmente el nmero de succiones o cesa la succin por completo.  Se duerme.  Relaja el cuerpo.  Retiene una pequea cantidad de leche en la boca.  Se desprende solo del pecho. Signos que presenta usted:  Las mamas han aumentado la firmeza, el peso y el tamao 1 a 3 horas despus de amamantar.  Estn ms blandas inmediatamente despus de amamantar.  Un aumento del volumen de leche, y tambin un cambio en su consistencia y color se producen hacia el quinto da de lactancia materna.  Los pezones no duelen, ni estn agrietados ni sangran. Signos de que su beb recibe la cantidad de leche suficiente  Moja al menos 3 paales en 24 horas. La orina debe ser clara y de color amarillo plido a los 5 das de vida.  Defeca al menos 3 veces en 24 horas a los 5 das de vida. La materia fecal debe ser blanda y amarillenta.  Defeca al menos 3 veces en 24 horas a los 7 das de vida. La materia fecal debe ser grumosa y amarillenta.  No registra una prdida de peso mayor del 10% del peso al nacer durante los primeros 3 das de vida.    Aumenta de peso un promedio de 4 a 7onzas (113 a 198g) por semana despus de los 4 das de vida.  Aumenta de peso, diariamente, de manera uniforme a partir de los 5 das de vida, sin registrar prdida de peso despus de las 2semanas de vida. Despus de  alimentarse, es posible que el beb regurgite una pequea cantidad. Esto es frecuente. FRECUENCIA Y DURACIN DE LA LACTANCIA MATERNA El amamantamiento frecuente la ayudar a producir ms leche y a prevenir problemas de dolor en los pezones e hinchazn en las mamas. Alimente al beb cuando muestre signos de hambre o si siente la necesidad de reducir la congestin de las mamas. Esto se denomina "lactancia a demanda". Evite el uso del chupete mientras trabaja para establecer la lactancia (las primeras 4 a 6 semanas despus del nacimiento del beb). Despus de este perodo, podr ofrecerle un chupete. Las investigaciones demostraron que el uso del chupete durante el primer ao de vida del beb disminuye el riesgo de desarrollar el sndrome de muerte sbita del lactante (SMSL). Permita que el nio se alimente en cada mama todo lo que desee. Contine amamantando al beb hasta que haya terminado de alimentarse. Cuando el beb se desprende o se queda dormido mientras se est alimentando de la primera mama, ofrzcale la segunda. Debido a que, con frecuencia, los recin nacidos permanecen somnolientos las primeras semanas de vida, es posible que deba despertar al beb para alimentarlo. Los horarios de lactancia varan de un beb a otro. Sin embargo, las siguientes reglas pueden servir como gua para ayudarla a garantizar que el beb se alimenta adecuadamente:  Se puede amamantar a los recin nacidos (bebs de 4 semanas o menos de vida) cada 1 a 3 horas.  No deben transcurrir ms de 3 horas durante el da o 5 horas durante la noche sin que se amamante a los recin nacidos.  Debe amamantar al beb 8 veces como mnimo en un perodo de 24 horas, hasta que comience a introducir slidos en su dieta, a los 6 meses de vida aproximadamente. EXTRACCIN DE LECHE MATERNA La extraccin y el almacenamiento de la leche materna le permiten asegurarse de que el beb se alimente exclusivamente de leche materna, aun en momentos en  los que no puede amamantar. Esto tiene especial importancia si debe regresar al trabajo en el perodo en que an est amamantando o si no puede estar presente en los momentos en que el beb debe alimentarse. Su asesor en lactancia puede orientarla sobre cunto tiempo es seguro almacenar leche materna.  El sacaleche es un aparato que le permite extraer leche de la mama a un recipiente estril. Luego, la leche materna extrada puede almacenarse en un refrigerador o congelador. Algunos sacaleches son manuales, mientras que otros son elctricos. Consulte a su asesor en lactancia qu tipo ser ms conveniente para usted. Los sacaleches se pueden comprar; sin embargo, algunos hospitales y grupos de apoyo a la lactancia materna alquilan sacaleches mensualmente. Un asesor en lactancia puede ensearle cmo extraer leche materna manualmente, en caso de que prefiera no usar un sacaleche.  CMO CUIDAR LAS MAMAS DURANTE LA LACTANCIA MATERNA Los pezones se secan, agrietan y duelen durante la lactancia materna. Las siguientes recomendaciones pueden ayudarla a mantener las mamas humectadas y sanas:  Evite usar jabn en los pezones.  Use un sostn de soporte. Aunque no son esenciales, las camisetas sin mangas o los sostenes especiales para amamantar estn diseados para acceder fcilmente a las mamas, para amamantar sin tener que quitarse todo   el sostn o la camiseta. Evite usar sostenes con aro o sostenes muy ajustados.  Seque al aire sus pezones durante 3 a 4minutos despus de amamantar al beb.  Utilice solo apsitos de algodn en el sostn para absorber las prdidas de leche. La prdida de un poco de leche materna entre las tomas es normal.  Utilice lanolina sobre los pezones luego de amamantar. La lanolina ayuda a mantener la humedad normal de la piel. Si usa lanolina pura, no tiene que lavarse los pezones antes de volver a alimentar al beb. La lanolina pura no es txica para el beb. Adems, puede extraer  manualmente algunas gotas de leche materna y masajear suavemente esa leche sobre los pezones, para que la leche se seque al aire. Durante las primeras semanas despus de dar a luz, algunas mujeres pueden experimentar hinchazn en las mamas (congestin mamaria). La congestin puede hacer que sienta las mamas pesadas, calientes y sensibles al tacto. El pico de la congestin ocurre dentro de los 3 a 5 das despus del parto. Las siguientes recomendaciones pueden ayudarla a aliviar la congestin:  Vace por completo las mamas al amamantar o extraer leche. Puede aplicar calor hmedo en las mamas (en la ducha o con toallas hmedas para manos) antes de amamantar o extraer leche. Esto aumenta la circulacin y ayuda a que la leche fluya. Si el beb no vaca por completo las mamas cuando lo amamanta, extraiga la leche restante despus de que haya finalizado.  Use un sostn ajustado (para amamantar o comn) o una camiseta sin mangas durante 1 o 2 das para indicar al cuerpo que disminuya ligeramente la produccin de leche.  Aplique compresas de hielo sobre las mamas, a menos que le resulte demasiado incmodo.  Asegrese de que el beb est prendido y se encuentre en la posicin correcta mientras lo alimenta. Si la congestin persiste luego de 48 horas o despus de seguir estas recomendaciones, comunquese con su mdico o un asesor en lactancia. RECOMENDACIONES GENERALES PARA EL CUIDADO DE LA SALUD DURANTE LA LACTANCIA MATERNA  Consuma alimentos saludables. Alterne comidas y colaciones, y coma 3 de cada una por da. Dado que lo que come afecta la leche materna, es posible que algunas comidas hagan que su beb se vuelva ms irritable de lo habitual. Evite comer este tipo de alimentos si percibe que afectan de manera negativa al beb.  Beba leche, jugos de fruta y agua para satisfacer su sed (aproximadamente 10 vasos al da).  Descanse con frecuencia, reljese y tome sus vitaminas prenatales para evitar la  fatiga, el estrs y la anemia.  Contine con los autocontroles de la mama.  Evite masticar y fumar tabaco.  Evite el consumo de alcohol y drogas. Algunos medicamentos, que pueden ser perjudiciales para el beb, pueden pasar a travs de la leche materna. Es importante que consulte a su mdico antes de tomar cualquier medicamento, incluidos todos los medicamentos recetados y de venta libre, as como los suplementos vitamnicos y herbales. Puede quedar embarazada durante la lactancia. Si desea controlar la natalidad, consulte a su mdico cules son las opciones ms seguras para el beb. SOLICITE ATENCIN MDICA SI:   Usted siente que quiere dejar de amamantar o se siente frustrada con la lactancia.  Siente dolor en las mamas o en los pezones.  Sus pezones estn agrietados o sangran.  Sus pechos estn irritados, sensibles o calientes.  Tiene un rea hinchada en cualquiera de las mamas.  Siente escalofros o fiebre.  Tiene nuseas o vmitos.    Presenta una secrecin de otro lquido distinto de la leche materna de los pezones.  Sus mamas no se llenan antes de amamantar al beb para el quinto da despus del parto.  Se siente triste y deprimida.  El beb est demasiado somnoliento como para comer bien.  El beb tiene problemas para dormir.  Moja menos de 3 paales en 24 horas.  Defeca menos de 3 veces en 24 horas.  La piel del beb o la parte [place] de los ojos se vuelven amarillentas.  El beb no ha aumentado de peso a los 5 das de vida. SOLICITE ATENCIN MDICA DE INMEDIATO SI:   El beb est muy cansado (letargo) y no se quiere despertar para comer.  Le sube la fiebre sin causa. Document Released: 01/06/2005 Document Revised: 01/11/2013 Exit[employer] Patient Information 2015 [employer]. This information is not intended to replace advice given to you by your health care provider. Make sure you discuss any questions you have with your health care provider.  

## 2013-08-22 NOTE — Progress Notes (Signed)
FBS 90-110 2 hour pp 100-198 almost all post lunch and dinner are too high Will add Glyburide 5 mg q am and 2.5 mg q hs Has 24 hour urine today

## 2013-08-23 LAB — COMPREHENSIVE METABOLIC PANEL
ALBUMIN: 3.4 g/dL — AB (ref 3.5–5.2)
ALT: 16 U/L (ref 0–35)
AST: 15 U/L (ref 0–37)
Alkaline Phosphatase: 47 U/L (ref 39–117)
BUN: 5 mg/dL — ABNORMAL LOW (ref 6–23)
CO2: 22 mEq/L (ref 19–32)
Calcium: 8.4 mg/dL (ref 8.4–10.5)
Chloride: 105 mEq/L (ref 96–112)
Creat: 0.44 mg/dL — ABNORMAL LOW (ref 0.50–1.10)
Glucose, Bld: 81 mg/dL (ref 70–99)
POTASSIUM: 3.8 meq/L (ref 3.5–5.3)
SODIUM: 136 meq/L (ref 135–145)
TOTAL PROTEIN: 6.1 g/dL (ref 6.0–8.3)
Total Bilirubin: 0.3 mg/dL (ref 0.2–1.2)

## 2013-08-23 LAB — CREATININE CLEARANCE, URINE, 24 HOUR
CREAT CLEAR: 151 mL/min — AB (ref 75–115)
Creatinine, 24H Ur: 958 mg/d (ref 700–1800)
Creatinine, Urine: 36.8 mg/dL
Creatinine: 0.44 mg/dL — ABNORMAL LOW (ref 0.50–1.10)

## 2013-08-29 ENCOUNTER — Ambulatory Visit (INDEPENDENT_AMBULATORY_CARE_PROVIDER_SITE_OTHER): Payer: Medicaid Other | Admitting: Obstetrics and Gynecology

## 2013-08-29 VITALS — BP 109/63 | HR 76 | Temp 98.4°F | Wt 178.5 lb

## 2013-08-29 DIAGNOSIS — O99810 Abnormal glucose complicating pregnancy: Secondary | ICD-10-CM

## 2013-08-29 LAB — POCT URINALYSIS DIP (DEVICE)
BILIRUBIN URINE: NEGATIVE
Glucose, UA: NEGATIVE mg/dL
HGB URINE DIPSTICK: NEGATIVE
KETONES UR: NEGATIVE mg/dL
LEUKOCYTES UA: NEGATIVE
Nitrite: NEGATIVE
Protein, ur: NEGATIVE mg/dL
Specific Gravity, Urine: 1.01 (ref 1.005–1.030)
Urobilinogen, UA: 0.2 mg/dL (ref 0.0–1.0)
pH: 6.5 (ref 5.0–8.0)

## 2013-08-29 MED ORDER — GLYBURIDE 2.5 MG PO TABS: 2.5000 mg | ORAL_TABLET | Freq: Two times a day (BID) | ORAL | Status: DC

## 2013-08-29 NOTE — Progress Notes (Signed)
States not taking glyburide because pharmacy states did not get order.

## 2013-08-29 NOTE — Progress Notes (Signed)
Supposed to be on -- Glyburide 5 mg q am and 2.5 mg q hs; States she has not started this because wasn't in the pharmacy. Fasting: 100, 110, 90, 95, 90, 95. 90. 100. 110, 90,85, 95, 100 2 hr pp: 105, 115, 117, 110, 105, 100, 110, 100, 95, 95, 90, 100, 115, 115, 106, 194, 185, 195, 160, 125, 115, 80, 115, 95, 100, 120, 130, 135, 110, 129, 160, 198, 133, 140, 130,130, 120, 100, 115, 145, 160, 180. 130  24 hour urine: Brought back past Monday. Creatinine, 24H Ur  958    Fetal Echo scheduled with Dr. Elizebeth Brookingotton on 09/01/13 @ 1400 -> knows, planningo n going [facility] 1000 --> went, but don't have results

## 2013-09-01 ENCOUNTER — Encounter: Payer: Self-pay | Admitting: Family Medicine

## 2013-09-05 ENCOUNTER — Ambulatory Visit (INDEPENDENT_AMBULATORY_CARE_PROVIDER_SITE_OTHER): Payer: Medicaid Other | Admitting: Family Medicine

## 2013-09-05 VITALS — BP 119/83 | HR 83 | Temp 98.0°F | Wt 179.3 lb

## 2013-09-05 DIAGNOSIS — R0601 Orthopnea: Secondary | ICD-10-CM

## 2013-09-05 DIAGNOSIS — O99810 Abnormal glucose complicating pregnancy: Secondary | ICD-10-CM

## 2013-09-05 LAB — POCT URINALYSIS DIP (DEVICE)
BILIRUBIN URINE: NEGATIVE
Glucose, UA: NEGATIVE mg/dL
Hgb urine dipstick: NEGATIVE
KETONES UR: NEGATIVE mg/dL
Leukocytes, UA: NEGATIVE
Nitrite: NEGATIVE
PROTEIN: NEGATIVE mg/dL
SPECIFIC GRAVITY, URINE: 1.015 (ref 1.005–1.030)
Urobilinogen, UA: 0.2 mg/dL (ref 0.0–1.0)
pH: 7 (ref 5.0–8.0)

## 2013-09-05 NOTE — Progress Notes (Signed)
Pt feels like she is having difficulty breathing especially at night when trying to sleep.

## 2013-09-05 NOTE — Progress Notes (Signed)
Patient is 28 y.o. [id number] 108w1d.  +FM, denies LOF, VB, contractions, vaginal discharge.  Reporting she has difficulty breathing at night, sleeps with 3 pillows to feel better, +exertional dyspnea, +PND, no wheezing/cough.  Denies lower extr edema, no chest pain. A2/DM: taking ASA 81, taking 2.5mg  BID  Fasting: 160-190 (7/7 out of range)  2h PP:140-190 (21/21 out of range) ==> incr glyburide 2.5mg  BID to 5mg  BID, discussed diet and need to start insulin at next visit if not improved. ==> ECHO ordered as well to r/o cardiomyopathy ==> serial sono ordered

## 2013-09-06 ENCOUNTER — Ambulatory Visit (HOSPITAL_COMMUNITY)
Admission: RE | Admit: 2013-09-06 | Discharge: 2013-09-06 | Disposition: A | Discharge status: Home / Self Care | Payer: Medicaid Other | Source: Ambulatory Visit | Attending: Family Medicine | Admitting: Family Medicine

## 2013-09-06 DIAGNOSIS — R0989 Other specified symptoms and signs involving the circulatory and respiratory systems: Principal | ICD-10-CM | POA: Insufficient documentation

## 2013-09-06 DIAGNOSIS — E669 Obesity, unspecified: Secondary | ICD-10-CM | POA: Insufficient documentation

## 2013-09-06 DIAGNOSIS — R0609 Other forms of dyspnea: Secondary | ICD-10-CM | POA: Insufficient documentation

## 2013-09-06 DIAGNOSIS — R0601 Orthopnea: Secondary | ICD-10-CM

## 2013-09-06 NOTE — Progress Notes (Signed)
Echocardiogram 2D Echocardiogram has been performed.  Martha [name], Martha [name] M 09/06/2013, 10:28 AM

## 2013-09-08 ENCOUNTER — Encounter: Payer: Self-pay | Admitting: *Deleted

## 2013-09-12 ENCOUNTER — Encounter: Payer: Self-pay | Admitting: Obstetrics & Gynecology

## 2013-09-12 ENCOUNTER — Ambulatory Visit (HOSPITAL_COMMUNITY)
Admission: RE | Admit: 2013-09-12 | Discharge: 2013-09-12 | Disposition: A | Discharge status: Home / Self Care | Payer: Medicaid Other | Source: Ambulatory Visit | Attending: Family Medicine | Admitting: Family Medicine

## 2013-09-12 ENCOUNTER — Encounter: Payer: Medicaid Other | Attending: Family Medicine | Admitting: *Deleted

## 2013-09-12 ENCOUNTER — Ambulatory Visit (INDEPENDENT_AMBULATORY_CARE_PROVIDER_SITE_OTHER): Payer: Medicaid Other | Admitting: Obstetrics & Gynecology

## 2013-09-12 VITALS — BP 121/71 | HR 92 | Temp 98.1°F | Wt 180.6 lb

## 2013-09-12 DIAGNOSIS — O99810 Abnormal glucose complicating pregnancy: Secondary | ICD-10-CM | POA: Diagnosis present

## 2013-09-12 DIAGNOSIS — Z713 Dietary counseling and surveillance: Secondary | ICD-10-CM | POA: Diagnosis not present

## 2013-09-12 DIAGNOSIS — Z23 Encounter for immunization: Secondary | ICD-10-CM

## 2013-09-12 DIAGNOSIS — Z3689 Encounter for other specified antenatal screening: Secondary | ICD-10-CM | POA: Diagnosis not present

## 2013-09-12 LAB — POCT URINALYSIS DIP (DEVICE)
Bilirubin Urine: NEGATIVE
GLUCOSE, UA: NEGATIVE mg/dL
HGB URINE DIPSTICK: NEGATIVE
KETONES UR: NEGATIVE mg/dL
Nitrite: NEGATIVE
Protein, ur: NEGATIVE mg/dL
SPECIFIC GRAVITY, URINE: 1.015 (ref 1.005–1.030)
Urobilinogen, UA: 0.2 mg/dL (ref 0.0–1.0)
pH: 7 (ref 5.0–8.0)

## 2013-09-12 LAB — CBC
HCT: 30.5 % — ABNORMAL LOW (ref 36.0–46.0)
HEMOGLOBIN: 10.6 g/dL — AB (ref 12.0–15.0)
MCH: 31 pg (ref 26.0–34.0)
MCHC: 34.8 g/dL (ref 30.0–36.0)
MCV: 89.2 fL (ref 78.0–100.0)
Platelets: 325 10*3/uL (ref 150–400)
RBC: 3.42 MIL/uL — AB (ref 3.87–5.11)
RDW: 13.2 % (ref 11.5–15.5)
WBC: 8 10*3/uL (ref 4.0–10.5)

## 2013-09-12 MED ORDER — INSULIN ASPART 100 UNIT/ML ~~LOC~~ SOLN: SUBCUTANEOUS | Status: DC

## 2013-09-12 MED ORDER — INSULIN SYRINGES (DISPOSABLE) U-100 1 ML MISC: Status: DC

## 2013-09-12 MED ORDER — TETANUS-DIPHTH-ACELL PERTUSSIS 5-2.5-18.5 LF-MCG/0.5 IM SUSP: 0.5000 mL | Freq: Once | INTRAMUSCULAR | Status: DC

## 2013-09-12 MED ORDER — INSULIN NPH (HUMAN) (ISOPHANE) 100 UNIT/ML ~~LOC~~ SUSP: SUBCUTANEOUS | Status: DC

## 2013-09-12 NOTE — Patient Instructions (Signed)
Regrese a la clinica cuando tenga su cita. Si tiene problemas o preguntas, llama a la clinica o vaya a la [facility] al [facility].    

## 2013-09-12 NOTE — Progress Notes (Signed)
C/o of intermittent pelvic pain-- c/o of pelvic pain last Thursday that lasted for 1.5hours and caused her to sweat. Has not experienced that severity since.   Called Dr. Casilda Carls office-- patient went to fetal echo 09/01/13-- results being faxed over.

## 2013-09-12 NOTE — Progress Notes (Signed)
Patient is Spanish-speaking only, Spanish interpreter present for this encounter. Reassured about intermittent pelvic pain;  preterm labor and fetal movement precautions reviewed. Blood sugar review: All BS elevated!! Fasting 115-190; 2 hr PP B 125-195, L 120-190, 122-180 Patient needs insulin; started today with the help of DM educator. Patient will be started on NPH 30 units in am/10 units at bedtime; then Novolog 12 units with breakfast and 10 units with dinner; prescriptions given. Third trimester labs, Tdap today.

## 2013-09-12 NOTE — Progress Notes (Signed)
Patient was seen for insulin instruction. Weight base calculation 26wks WT. 180.6# Patient demonstrated appropriate hygiene   Drawing up single dose using vials    Patient demonstrated understanding of insulin administration by return demonstration.  Patient received the following handouts:  Insulin Instruction Handout  Mixing Insulin Brochure by [facility] Getting Started                                        Patient to start on insulin as Rx'd by MD. Hard copy RX given for insulin and syringes Patient will be seen for follow-up next week  In review of her dietary intake: Brkfast (0600) Cereal , Milk                                                 Insulin: NPH 30u......... Novolog 12units Snack  (1000) Egg, cheese, cracker Lunch  (1200) beans, sour cream eggs Snack  (1:30) Fruit - recommended adding a protein Dinner  (6:00) Encouraged to have a full meal in order to take insulin                     Novolog 10units Snack   (8:00)  Snack                                                                       NPH: 10units

## 2013-09-13 LAB — PROTEIN / CREATININE RATIO, URINE
CREATININE, URINE: 128 mg/dL
PROTEIN CREATININE RATIO: 0.18 — AB (ref <0.15)
TOTAL PROTEIN, URINE: 23 mg/dL (ref 5–24)

## 2013-09-13 LAB — RPR

## 2013-09-13 LAB — HIV ANTIBODY (ROUTINE TESTING W REFLEX): HIV: NONREACTIVE

## 2013-09-14 ENCOUNTER — Encounter: Payer: Self-pay | Admitting: *Deleted

## 2013-09-19 ENCOUNTER — Encounter: Payer: Self-pay | Admitting: Obstetrics and Gynecology

## 2013-09-21 ENCOUNTER — Encounter: Payer: Self-pay | Admitting: General Practice

## 2013-10-06 ENCOUNTER — Observation Stay (HOSPITAL_COMMUNITY)
Admission: EM | Admit: 2013-10-06 | Discharge: 2013-10-07 | Disposition: A | Discharge status: Other Acute Inpatient Hospital | Payer: Self-pay | Attending: Obstetrics & Gynecology | Admitting: Obstetrics & Gynecology

## 2013-10-06 ENCOUNTER — Emergency Department (HOSPITAL_COMMUNITY): Payer: Medicaid Other

## 2013-10-06 ENCOUNTER — Encounter (HOSPITAL_COMMUNITY): Payer: Self-pay | Admitting: Emergency Medicine

## 2013-10-06 ENCOUNTER — Encounter: Payer: Self-pay | Admitting: Family

## 2013-10-06 ENCOUNTER — Telehealth: Payer: Self-pay | Admitting: Obstetrics and Gynecology

## 2013-10-06 DIAGNOSIS — O9989 Other specified diseases and conditions complicating pregnancy, childbirth and the puerperium: Principal | ICD-10-CM | POA: Insufficient documentation

## 2013-10-06 DIAGNOSIS — O99810 Abnormal glucose complicating pregnancy: Secondary | ICD-10-CM

## 2013-10-06 DIAGNOSIS — M549 Dorsalgia, unspecified: Secondary | ICD-10-CM

## 2013-10-06 DIAGNOSIS — O0990 Supervision of high risk pregnancy, unspecified, unspecified trimester: Secondary | ICD-10-CM

## 2013-10-06 DIAGNOSIS — Z3490 Encounter for supervision of normal pregnancy, unspecified, unspecified trimester: Secondary | ICD-10-CM

## 2013-10-06 DIAGNOSIS — Y9389 Activity, other specified: Secondary | ICD-10-CM | POA: Insufficient documentation

## 2013-10-06 DIAGNOSIS — Y92410 Unspecified street and highway as the place of occurrence of the external cause: Secondary | ICD-10-CM | POA: Insufficient documentation

## 2013-10-06 DIAGNOSIS — M545 Low back pain: Secondary | ICD-10-CM

## 2013-10-06 HISTORY — DX: Gestational diabetes mellitus in pregnancy, unspecified control: O24.419

## 2013-10-06 LAB — URINALYSIS, ROUTINE W REFLEX MICROSCOPIC
BILIRUBIN URINE: NEGATIVE
GLUCOSE, UA: NEGATIVE mg/dL
HGB URINE DIPSTICK: NEGATIVE
Ketones, ur: NEGATIVE mg/dL
Nitrite: NEGATIVE
PH: 7 (ref 5.0–8.0)
PROTEIN: NEGATIVE mg/dL
Specific Gravity, Urine: 1.016 (ref 1.005–1.030)
Urobilinogen, UA: 1 mg/dL (ref 0.0–1.0)

## 2013-10-06 LAB — URINE MICROSCOPIC-ADD ON

## 2013-10-06 LAB — BASIC METABOLIC PANEL
Anion gap: 16 — ABNORMAL HIGH (ref 5–15)
BUN: 6 mg/dL (ref 6–23)
CO2: 19 meq/L (ref 19–32)
CREATININE: 0.44 mg/dL — AB (ref 0.50–1.10)
Calcium: 8.5 mg/dL (ref 8.4–10.5)
Chloride: 105 mEq/L (ref 96–112)
GFR calc Af Amer: >90 mL/min (ref >90)
GFR calc non Af Amer: >90 mL/min (ref >90)
GLUCOSE: 87 mg/dL (ref 70–99)
Potassium: 3.4 mEq/L — ABNORMAL LOW (ref 3.7–5.3)
SODIUM: 140 meq/L (ref 137–147)

## 2013-10-06 LAB — CBG MONITORING, ED: Glucose-Capillary: 87 mg/dL (ref 70–99)

## 2013-10-06 LAB — GLUCOSE, CAPILLARY
GLUCOSE-CAPILLARY: 111 mg/dL — AB (ref 70–99)
GLUCOSE-CAPILLARY: 130 mg/dL — AB (ref 70–99)

## 2013-10-06 LAB — CBC WITH DIFFERENTIAL/PLATELET
Basophils Absolute: 0 10*3/uL (ref 0.0–0.1)
Basophils Relative: 0 % (ref 0–1)
EOS ABS: 0.4 10*3/uL (ref 0.0–0.7)
EOS PCT: 5 % (ref 0–5)
HEMATOCRIT: 31.5 % — AB (ref 36.0–46.0)
Hemoglobin: 11 g/dL — ABNORMAL LOW (ref 12.0–15.0)
LYMPHS ABS: 1.5 10*3/uL (ref 0.7–4.0)
LYMPHS PCT: 20 % (ref 12–46)
MCH: 30.9 pg (ref 26.0–34.0)
MCHC: 34.9 g/dL (ref 30.0–36.0)
MCV: 88.5 fL (ref 78.0–100.0)
MONO ABS: 0.6 10*3/uL (ref 0.1–1.0)
Monocytes Relative: 8 % (ref 3–12)
Neutro Abs: 5.1 10*3/uL (ref 1.7–7.7)
Neutrophils Relative %: 67 % (ref 43–77)
Platelets: 280 10*3/uL (ref 150–400)
RBC: 3.56 MIL/uL — AB (ref 3.87–5.11)
RDW: 12.3 % (ref 11.5–15.5)
WBC: 7.6 10*3/uL (ref 4.0–10.5)

## 2013-10-06 MED ORDER — ACETAMINOPHEN 500 MG PO TABS
1000.0000 mg | ORAL_TABLET | Freq: Once | ORAL | Status: AC
Start: 2013-10-06 — End: 2013-10-06
  Administered 2013-10-06: 1000 mg via ORAL
  Filled 2013-10-06: qty 2

## 2013-10-06 MED ORDER — SODIUM CHLORIDE 0.9 % IV BOLUS (SEPSIS)
1000.0000 mL | Freq: Once | INTRAVENOUS | Status: AC
  Administered 2013-10-06: 1000 mL via INTRAVENOUS

## 2013-10-06 MED ORDER — INSULIN NPH (HUMAN) (ISOPHANE) 100 UNIT/ML ~~LOC~~ SUSP
10.0000 [IU] | Freq: Every day | SUBCUTANEOUS | Status: DC
  Administered 2013-10-06: 10 [IU] via SUBCUTANEOUS
  Filled 2013-10-06: qty 10

## 2013-10-06 MED ORDER — PRENATAL MULTIVITAMIN CH
1.0000 | ORAL_TABLET | Freq: Every day | ORAL | Status: DC
  Administered 2013-10-07: 1 via ORAL
  Filled 2013-10-06: qty 1

## 2013-10-06 MED ORDER — ZOLPIDEM TARTRATE 5 MG PO TABS: 5.0000 mg | ORAL_TABLET | Freq: Every evening | ORAL | Status: DC | PRN

## 2013-10-06 MED ORDER — SODIUM CHLORIDE 0.9 % IJ SOLN
3.0000 mL | Freq: Two times a day (BID) | INTRAMUSCULAR | Status: DC
  Administered 2013-10-06: 3 mL via INTRAVENOUS

## 2013-10-06 MED ORDER — CALCIUM CARBONATE ANTACID 500 MG PO CHEW: 2.0000 | CHEWABLE_TABLET | ORAL | Status: DC | PRN

## 2013-10-06 MED ORDER — GLYBURIDE 2.5 MG PO TABS
2.5000 mg | ORAL_TABLET | Freq: Two times a day (BID) | ORAL | Status: DC
  Administered 2013-10-06 – 2013-10-07 (×2): 2.5 mg via ORAL
  Filled 2013-10-06 (×3): qty 1

## 2013-10-06 MED ORDER — ACETAMINOPHEN 325 MG PO TABS: 650.0000 mg | ORAL_TABLET | ORAL | Status: DC | PRN

## 2013-10-06 NOTE — ED Notes (Signed)
MD at bedside with interpreter phone. Ultrasound at bedside. Fetal monitor at bedside. Rapid response RN paged.

## 2013-10-06 NOTE — ED Notes (Signed)
Transfer information discussed with patient via translator. Pt verbalized understanding, no questions.

## 2013-10-06 NOTE — ED Notes (Signed)
Fetal HR dopplered at 135

## 2013-10-06 NOTE — Plan of Care (Signed)
Problem: Consults Goal: Birthing Suites Patient Information Press F2 to bring up selections list   Pt < [redacted] weeks EGA     
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
## 2013-10-06 NOTE — ED Notes (Signed)
Pt placed on fetal monitor. Fetal hr WDL. Awaiting rapid response rn

## 2013-10-06 NOTE — Telephone Encounter (Signed)
Called left patient message, to return call to reschedule missed high risk ob appointment.
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
## 2013-10-06 NOTE — Telephone Encounter (Signed)
Patient called into office, rescheduled her appointment to Monday.

## 2013-10-06 NOTE — ED Notes (Signed)
EMTALA reviewed by Morrison Old

## 2013-10-07 LAB — GLUCOSE, CAPILLARY
GLUCOSE-CAPILLARY: 65 mg/dL — AB (ref 70–99)
Glucose-Capillary: 112 mg/dL — ABNORMAL HIGH (ref 70–99)
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
## 2013-10-07 NOTE — Progress Notes (Signed)
I stopped to check on patients needs. By Orlan Leavens, Spanish Interpreter

## 2013-10-07 NOTE — Progress Notes (Signed)
Assisted Dr. Dolan Amen with discharge plan. Eda H Royal  Interpreter.

## 2013-10-07 NOTE — Progress Notes (Signed)
Patient ready for discharge. No complaints.  Her instructions were given with interpreter Eda.  She verbalizes understanding.
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
## 2013-10-07 NOTE — Progress Notes (Signed)
UR chart review completed.  

## 2013-10-10 ENCOUNTER — Ambulatory Visit (INDEPENDENT_AMBULATORY_CARE_PROVIDER_SITE_OTHER): Payer: Medicaid Other | Admitting: Family Medicine

## 2013-10-10 VITALS — BP 118/82 | HR 86 | Temp 98.4°F | Wt 181.0 lb

## 2013-10-10 DIAGNOSIS — IMO0002 Reserved for concepts with insufficient information to code with codable children: Secondary | ICD-10-CM

## 2013-10-10 DIAGNOSIS — IMO0001 Reserved for inherently not codable concepts without codable children: Secondary | ICD-10-CM

## 2013-10-10 DIAGNOSIS — E1165 Type 2 diabetes mellitus with hyperglycemia: Secondary | ICD-10-CM

## 2013-10-10 LAB — POCT URINALYSIS DIP (DEVICE)
Bilirubin Urine: NEGATIVE
Glucose, UA: NEGATIVE mg/dL
HGB URINE DIPSTICK: NEGATIVE
KETONES UR: NEGATIVE mg/dL
Leukocytes, UA: NEGATIVE
Nitrite: NEGATIVE
PH: 7 (ref 5.0–8.0)
PROTEIN: NEGATIVE mg/dL
SPECIFIC GRAVITY, URINE: 1.01 (ref 1.005–1.030)
UROBILINOGEN UA: 0.2 mg/dL (ref 0.0–1.0)
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
## 2013-10-10 NOTE — Progress Notes (Signed)
U/S 10/24/13 @ 1030a with spanish interpreter.

## 2013-10-10 NOTE — Progress Notes (Signed)
Itching: up to 2-3x/week, sometimes at night, pruritis is diffuse, also feels body is very heavy (s/p cholecystectomy) A2/BDM: - fasting: 88-118 (9/13 out of range) - 2h PP" 95-115  - current dosage: unsure how much she is taking, reports only taking 2 shots daily, 1 units and 2 units and is unsure which insulin she takes.  Reinforced correct regimen, written in Spanish on instructions.  Will follow up cbgs in 2 weeks, at that time if needed will increase insulin accordingly.

## 2013-10-10 NOTE — Patient Instructions (Addendum)
NPH: (insulina que trabaja lentamente): 30 unidades en la manana Novolog: 12 unidades con desayunar, 10 con cena

## 2013-10-24 ENCOUNTER — Ambulatory Visit (HOSPITAL_COMMUNITY)
Admission: RE | Admit: 2013-10-24 | Discharge: 2013-10-24 | Disposition: A | Discharge status: Home / Self Care | Payer: Self-pay | Source: Ambulatory Visit | Attending: Family Medicine | Admitting: Family Medicine

## 2013-10-24 ENCOUNTER — Other Ambulatory Visit: Payer: Self-pay | Admitting: Family Medicine

## 2013-10-24 ENCOUNTER — Ambulatory Visit (INDEPENDENT_AMBULATORY_CARE_PROVIDER_SITE_OTHER): Payer: Self-pay | Admitting: Family Medicine

## 2013-10-24 VITALS — BP 132/83 | HR 78 | Wt 178.0 lb

## 2013-10-24 DIAGNOSIS — O99810 Abnormal glucose complicating pregnancy: Secondary | ICD-10-CM

## 2013-10-24 DIAGNOSIS — E1165 Type 2 diabetes mellitus with hyperglycemia: Secondary | ICD-10-CM

## 2013-10-24 DIAGNOSIS — Z3A32 32 weeks gestation of pregnancy: Secondary | ICD-10-CM

## 2013-10-24 DIAGNOSIS — IMO0002 Reserved for concepts with insufficient information to code with codable children: Secondary | ICD-10-CM

## 2013-10-24 DIAGNOSIS — O24410 Gestational diabetes mellitus in pregnancy, diet controlled: Secondary | ICD-10-CM

## 2013-10-24 LAB — FETAL NONSTRESS TEST
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
## 2013-10-24 NOTE — Progress Notes (Signed)
Martha [name] interpreter used NST reviewed and reactive. FBS 90-115 (all out of range) 2 hour pp 90-110 Many end in 0 or 5--need meter Needs meter, and insulin instructions verified and looked at on Thursday when here for ante testing and adjustments made accordingly. Pt. Has insulin instructions that are different than what is in chart.  May need more insulin in pm--pt. Initially stated only taking am NPH however, later stated she was taking NPH 30 at hs as well.

## 2013-10-24 NOTE — Patient Instructions (Signed)
Diabetes mellitus gestacional (Gestational Diabetes Mellitus) La diabetes mellitus gestacional, ms comnmente conocida como diabetes gestacional es un tipo de diabetes que desarrollan algunas mujeres durante el embarazo. En la diabetes gestacional, el pncreas no produce suficiente insulina (una hormona) o las clulas son menos sensibles a la insulina producida (resistencia a la insulina), o ambas cosas. Normalmente, la insulina mueve los azcares de los alimentos a las clulas de los tejidos. Las clulas de los tejidos utilizan los azcares para obtener energa. La falta de insulina o la falta de una respuesta normal a la insulina hace que el exceso de azcar se acumule en la sangre en lugar de penetrar en las clulas de los tejidos. Como resultado, se producen niveles altos de azcar en la sangre (hiperglucemia). El efecto de los niveles altos de azcar (glucosa) puede causar muchos problemas.  FACTORES DE RIESGO Usted tiene mayor probabilidad de desarrollar diabetes gestacional si tiene antecedentes familiares de diabetes y tambin si tiene uno o ms de los siguientes factores de riesgo:  ndice de masa corporal superior a 30 (obesidad).  Embarazo previo con diabetes gestacional.  La edad avanzada en el momento del embarazo. Si se mantienen los niveles de glucosa en la sangre en un rango normal durante el embarazo, las mujeres pueden tener un embarazo saludable. Si los niveles de glucosa en la sangre no estn bien controlados, puede haber riesgos para usted, el feto o el recin nacido, o durante el trabajo de parto y el parto.  SNTOMAS  Si se presentan sntomas, stos son similares a los sntomas que normalmente experimentar durante el embarazo. Los sntomas de la diabetes gestacional son:   Aumento de la sed (polidipsia).  Aumento de la miccin (poliuria).  Orina con ms frecuencia durante la noche (nocturia).  Prdida de peso. La prdida de peso puede ser muy rpida.  Infecciones  frecuentes y recurrentes.  Cansancio (fatiga).  Debilidad.  Cambios en la visin, como visin borrosa.  Olor a fruta en el aliento.  Dolor abdominal. DIAGNSTICO La diabetes se diagnostica cuando hay aumento de los niveles de glucosa en la sangre. El nivel de glucosa en la sangre puede controlarse en uno o ms de los siguientes anlisis de sangre:  Medicin de glucosa en la sangre en ayunas. No se le permitir comer durante al menos 8 horas antes de que se tome una muestra de sangre.  Pruebas al azar de glucosa en la sangre. El nivel de glucosa en la sangre se controla en cualquier momento del da sin importar el momento en que haya comido.  Prueba de A1c (hemoglobina glucosilada) Una prueba de A1c proporciona informacin sobre el control de la glucosa en la sangre durante los ltimos 3 meses.  Prueba de tolerancia a la glucosa oral (PTGO). La glucosa en la sangre se mide despus de no haber comido (ayunas) durante una a tres horas y despus de beber una bebida que contenga glucosa. Dado que las hormonas que causan la resistencia a la insulina son ms altas alrededor de las semanas 24 a 28 de embarazo, generalmente se realiza una PTGO durante ese tiempo. Si tiene factores de riesgo de diabetes gestacional, su mdico puede hacerle estudios de deteccin antes de las 24semanas de embarazo. TRATAMIENTO   Usted tendr que tomar medicamentos para la diabetes o insulina diariamente para mantener los niveles de glucosa en la sangre en el rango deseado.  Usted tendr que combinar la dosis de insulina con la actividad fsica y la eleccin de alimentos saludables. El objetivo del   tratamiento es mantener el nivel de azcar en la sangre previo a comer (preprandial) y durante la noche entre 60 y 99mg/dl, durante todo el embarazo. El objetivo del tratamiento es mantener el nivel pico de azcar en la sangre despus de comer (glucosa posprandial) entre 100y 140mg/dl. INSTRUCCIONES PARA EL CUIDADO EN EL  HOGAR   Controle su nivel de hemoglobina A1c dos veces al ao.  Contrlese a diario el nivel de glucosa en la sangre segn las indicaciones de su mdico. Es comn realizar controles frecuentes de la glucosa en la sangre.  Supervise las cetonas en la orina cuando est enferma y segn las indicaciones de su mdico.  Tome el medicamento para la diabetes y adminstrese insulina segn las indicaciones de su mdico para mantener el nivel de glucosa en la sangre en el rango deseado.  Nunca se quede sin medicamento para la diabetes o sin insulina. Es necesario que la reciba todos los das.  Ajuste la insulina segn la ingesta de hidratos de carbono. Los hidratos de carbono pueden aumentar los niveles de glucosa en la sangre, pero deben incluirse en su dieta. Los hidratos de carbono aportan vitaminas, minerales y fibra que son una parte esencial de una dieta saludable. Los hidratos de carbono se encuentran en frutas, verduras, cereales integrales, productos lcteos, legumbres y alimentos que contienen azcares aadidos.  Consuma alimentos saludables. Alterne 3 comidas con 3 colaciones.  Aumente de peso saludablemente. El aumento del peso total vara de acuerdo con el ndice de masa corporal que tena antes del embarazo ([facility]).  Lleve una tarjeta de alerta mdica o use una pulsera o medalla de alerta mdica.  Lleve con usted una colacin de 15gramos de hidratos de carbono en todo momento para controlar los niveles bajos de glucosa en la sangre (hipoglucemia). Algunos ejemplos de colaciones de 15gramos de hidratos de carbono son los siguientes:  Tabletas de glucosa, 3 o 4.  Gel de glucosa, tubo de 15 gramos.  Pasas de uva, 2 cucharadas (24 g).  Caramelos de goma, 6.  Galletas de animales, 8.  Jugo de fruta, gaseosa comn, o leche descremada, 4 onzas (120 ml).  Pastillas de goma, 9.  Reconocer la hipoglucemia. Durante el embarazo la hipoglucemia se produce cuando hay niveles de glucosa en la  sangre de 60 mg/dl o menos. El riesgo de hipoglucemia aumenta durante el ayuno o cuando se saltea las comidas, durante o despus de realizar ejercicio intenso y mientras duerme. Los sntomas de hipoglucemia son:  Temblores o sacudidas.  Disminucin de la capacidad de concentracin.  Sudoracin.  Aumento de la frecuencia cardaca.  Dolor de cabeza.  Sequedad en la boca.  Hambre.  Irritabilidad.  Ansiedad.  Sueo agitado.  Alteracin del habla o de la coordinacin.  Confusin.  Tratar la hipoglucemia rpidamente. Si usted est alerta y puede tragar con seguridad, siga la regla de 15/15 que consiste en:  Tome entre 15 y 20gramos de glucosa de accin rpida o carbohidratos. Las opciones de accin rpida son un gel de glucosa, tabletas de glucosa, o 4 onzas (120 ml) de jugo de frutas, gaseosa comn, o leche baja en grasa.  Compruebe su nivel de glucosa en la sangre 15 minutos despus de tomar la glucosa.  Tome entre 15 y 20 gramos ms de glucosa si el nivel de glucosa en la sangre todava es de 70mg/dl o inferior.  Ingiera una comida o una colacin en el lapso de 1 hora una vez que los niveles de glucosa en la sangre vuelven   a la normalidad.  Est atento a la poliuria (miccin excesiva) y la polidipsia (sensacin de mucha sed), que son los primeros signos de la hiperglucemia. El reconocimiento temprano de la hiperglucemia permite un tratamiento oportuno. Trate la hiperglucemia segn le indic su mdico.  Haga actividad fsica por lo menos 30minutos al da o como lo indique su mdico. Se recomienda que 30 minutos despus de cada comida, realice diez minutos de actividad fsica para controlar los niveles de glucosa postprandial en la sangre.  Ajuste su dosis de insulina y la ingesta de alimentos, segn sea necesario, si inicia un nuevo ejercicio o deporte.  Siga su plan para los das de enfermedad cuando no pueda comer o beber como de costumbre.  Evite el tabaco y el  alcohol.  Concurra a todas las visitas de control como se lo haya indicado el mdico.  Siga el consejo del mdico respecto a los controles prenatales y posteriores al parto (postparto), las visitas, la planificacin de las comidas, el ejercicio, los medicamentos, las vitaminas, los anlisis de sangre, otras pruebas mdicas y actividades fsicas.  Realice diariamente el cuidado de la piel y de los pies. Examine su piel y los pies diariamente para ver si tiene cortes, moretones, enrojecimiento, problemas en las uas, sangrado, ampollas o llagas.  Cepllese los dientes y encas por lo menos dos veces al da y use hilo dental al menos una vez por da. Concurra regularmente a las visitas de control con el dentista.  Programe un examen de vista durante el primer trimestre de su embarazo o como lo indique su mdico.  Comparta su plan de control de diabetes en el trabajo o en la escuela.  Mantngase al da con las vacunas.  Aprenda a manejar el estrs.  Obtenga la mayor cantidad posible de informacin sobre la diabetes y solicite ayuda siempre que sea necesario.  Obtenga informacin sobre el amamantamiento y analice esta posibilidad.  Debe controlar el nivel de azcar en la sangre de 6a 12semanas despus del parto. Esto se hace con una prueba de tolerancia a la glucosa oral (PTGO). SOLICITE ATENCIN MDICA SI:   No puede comer alimentos o beber por ms de 6 horas.  Tuvo nuseas o ha vomitado durante ms de 6 horas.  Tiene un nivel de glucosa en la sangre de 200 mg/dl y cetonas en la orina.  Presenta algn cambio en el estado mental.  Desarrolla problemas de visin.  Sufre un dolor persistente de cabeza.  Siente dolor o molestias en la parte superior del abdomen.  Desarrolla una enfermedad grave adicional.  Tuvo diarrea durante ms de 6 horas.  Ha estado enfermo o ha tenido fiebre durante un par de das y no mejora. SOLICITE ATENCIN MDICA DE INMEDIATO SI:   Tiene dificultad  para respirar.  Ya no siente los movimientos del beb.  Est sangrando o tiene flujo vaginal.  Comienza a tener contracciones o trabajo de parto prematuro. ASEGRESE DE QUE:  Comprende estas instrucciones.  Controlar su afeccin.  Recibir ayuda de inmediato si no mejora o si empeora. Document Released: 10/16/2004 Document Revised: 05/23/2013 [employer] Patient Information 2015 [employer]. This information is not intended to replace advice given to you by your health care provider. Make sure you discuss any questions you have with your health care provider.  Lactancia materna (Breastfeeding) Decidir amamantar es una de las mejores elecciones que puede hacer por usted y su beb. El cambio hormonal durante el embarazo produce el desarrollo del tejido mamario y aumenta la cantidad   y el tamao de los conductos galactforos. Estas hormonas tambin permiten que las protenas, los azcares y las grasas de la sangre produzcan la leche materna en las glndulas productoras de leche. Las hormonas impiden que la leche materna sea liberada antes del nacimiento del beb, adems de impulsar el flujo de leche luego del nacimiento. Una vez que ha comenzado a amamantar, pensar en el beb, as como la succin o el llanto, pueden estimular la liberacin de leche de las glndulas productoras de leche.  LOS BENEFICIOS DE AMAMANTAR Para el beb  La primera leche (calostro) ayuda a mejorar el funcionamiento del sistema digestivo del beb.  La leche tiene anticuerpos que ayudan a prevenir las infecciones en el beb.  El beb tiene una menor incidencia de asma, alergias y del sndrome de muerte sbita del lactante.  Los nutrientes en la leche materna son mejores para el beb que la leche maternizada y estn preparados exclusivamente para cubrir las necesidades del beb.  La leche materna mejora el desarrollo cerebral del beb.  Es menos probable que el beb desarrolle otras enfermedades, como obesidad  infantil, asma o diabetes mellitus de tipo 2. Para usted   La lactancia materna favorece el desarrollo de un vnculo muy especial entre la madre y el beb.  Es conveniente. La leche materna siempre est disponible a la temperatura correcta y es econmica.  La lactancia materna ayuda a quemar caloras y a perder el peso ganado durante el embarazo.  Favorece la contraccin del tero al tamao que tena antes del embarazo de manera ms rpida y disminuye el sangrado (loquios) despus del parto.  La lactancia materna contribuye a reducir el riesgo de desarrollar diabetes mellitus de tipo 2, osteoporosis o cncer de mama o de ovario en el futuro. SIGNOS DE QUE EL BEB EST HAMBRIENTO Primeros signos de hambre  Aumenta su estado de alerta o actividad.  Se estira.  Mueve la cabeza de un lado a otro.  Mueve la cabeza y abre la boca cuando se le toca la mejilla o la comisura de la boca (reflejo de bsqueda).  Aumenta las vocalizaciones, tales como sonidos de succin, se relame los labios, emite arrullos, suspiros, o chirridos.  Mueve la mano hacia la boca.  Se chupa con ganas los dedos o las manos. Signos tardos de hambre  Est agitado.  Llora de manera intermitente. Signos de hambre extrema Los signos de hambre extrema requerirn que lo calme y lo consuele antes de que el beb pueda alimentarse adecuadamente. No espere a que se manifiesten los siguientes signos de hambre extrema para comenzar a amamantar:   Agitacin.  Llanto intenso y fuerte.   Gritos. INFORMACIN BSICA SOBRE LA LACTANCIA MATERNA Iniciacin de la lactancia materna  Encuentre un lugar cmodo para sentarse o acostarse, con un buen respaldo para el cuello y la espalda.  Coloque una almohada o una manta enrollada debajo del beb para acomodarlo a la altura de la mama (si est sentada). Las almohadas para amamantar se han diseado especialmente a fin de servir de apoyo para los brazos y el beb mientras  amamanta.  Asegrese de que el abdomen del beb est frente al suyo.  Masajee suavemente la mama. Con las yemas de los dedos, masajee la pared del pecho hacia el pezn en un movimiento circular. Esto estimula el flujo de leche. Es posible que deba continuar este movimiento mientras amamanta si la leche fluye lentamente.  Sostenga la mama con el pulgar por arriba del pezn y los otros   4 dedos por debajo de la mama. Asegrese de que los dedos se encuentren lejos del pezn y de la boca del beb.  Empuje suavemente los labios del beb con el pezn o con el dedo.  Cuando la boca del beb se abra lo suficiente, acrquelo rpidamente a la mama e introduzca todo el pezn y la zona oscura que lo rodea (areola), tanto como sea posible, dentro de la boca del beb.  Debe haber ms areola visible por arriba del labio superior del beb que por debajo del labio inferior.  La lengua del beb debe estar entre la enca inferior y la mama.  Asegrese de que la boca del beb est en la posicin correcta alrededor del pezn (prendida). Los labios del beb deben crear un sello sobre la mama y estar doblados hacia afuera (invertidos).  Es comn que el beb succione durante 2 a 3 minutos para que comience el flujo de leche materna. Cmo debe prenderse Es muy importante que le ensee al beb cmo prenderse adecuadamente a la mama. Si el beb no se prende adecuadamente, puede causarle dolor en el pezn y reducir la produccin de leche materna, y hacer que el beb tenga un escaso aumento de peso. Adems, si el beb no se prende adecuadamente al pezn, puede tragar aire durante la alimentacin. Esto puede causarle molestias al beb. Hacer eructar al beb al cambiar de mama puede ayudarlo a liberar el aire. Sin embargo, ensearle al beb cmo prenderse a la mama adecuadamente es la mejor manera de evitar que se sienta molesto por tragar aire mientras se alimenta. Signos de que el beb se ha prendido adecuadamente al pezn:    Tironea o succiona de modo silencioso, sin causarle dolor.  Se escucha que traga cada 3 o 4 succiones.   Hay movimientos musculares por arriba y por delante de sus odos al succionar. Signos de que el beb no se ha prendido adecuadamente al pezn:   Hace ruidos de succin o de chasquido mientras se alimenta.  Siente dolor en el pezn. Si cree que el beb no se prendi correctamente, deslice el dedo en la comisura de la boca y colquelo entre las encas del beb para interrumpir la succin. Intente comenzar a amamantar nuevamente. Signos de lactancia materna exitosa Signos del beb:   Disminuye gradualmente el nmero de succiones o cesa la succin por completo.  Se duerme.  Relaja el cuerpo.  Retiene una pequea cantidad de leche en la boca.  Se desprende solo del pecho. Signos que presenta usted:  Las mamas han aumentado la firmeza, el peso y el tamao 1 a 3 horas despus de amamantar.  Estn ms blandas inmediatamente despus de amamantar.  Un aumento del volumen de leche, y tambin un cambio en su consistencia y color se producen hacia el quinto da de lactancia materna.  Los pezones no duelen, ni estn agrietados ni sangran. Signos de que su beb recibe la cantidad de leche suficiente  Moja al menos 3 paales en 24 horas. La orina debe ser clara y de color amarillo plido a los 5 das de vida.  Defeca al menos 3 veces en 24 horas a los 5 das de vida. La materia fecal debe ser blanda y amarillenta.  Defeca al menos 3 veces en 24 horas a los 7 das de vida. La materia fecal debe ser grumosa y amarillenta.  No registra una prdida de peso mayor del 10% del peso al nacer durante los primeros 3 das de vida.    Aumenta de peso un promedio de 4 a 7onzas (113 a 198g) por semana despus de los 4 das de vida.  Aumenta de peso, diariamente, de manera uniforme a partir de los 5 das de vida, sin registrar prdida de peso despus de las 2semanas de vida. Despus de  alimentarse, es posible que el beb regurgite una pequea cantidad. Esto es frecuente. FRECUENCIA Y DURACIN DE LA LACTANCIA MATERNA El amamantamiento frecuente la ayudar a producir ms leche y a prevenir problemas de dolor en los pezones e hinchazn en las mamas. Alimente al beb cuando muestre signos de hambre o si siente la necesidad de reducir la congestin de las mamas. Esto se denomina "lactancia a demanda". Evite el uso del chupete mientras trabaja para establecer la lactancia (las primeras 4 a 6 semanas despus del nacimiento del beb). Despus de este perodo, podr ofrecerle un chupete. Las investigaciones demostraron que el uso del chupete durante el primer ao de vida del beb disminuye el riesgo de desarrollar el sndrome de muerte sbita del lactante (SMSL). Permita que el nio se alimente en cada mama todo lo que desee. Contine amamantando al beb hasta que haya terminado de alimentarse. Cuando el beb se desprende o se queda dormido mientras se est alimentando de la primera mama, ofrzcale la segunda. Debido a que, con frecuencia, los recin nacidos permanecen somnolientos las primeras semanas de vida, es posible que deba despertar al beb para alimentarlo. Los horarios de lactancia varan de un beb a otro. Sin embargo, las siguientes reglas pueden servir como gua para ayudarla a garantizar que el beb se alimenta adecuadamente:  Se puede amamantar a los recin nacidos (bebs de 4 semanas o menos de vida) cada 1 a 3 horas.  No deben transcurrir ms de 3 horas durante el da o 5 horas durante la noche sin que se amamante a los recin nacidos.  Debe amamantar al beb 8 veces como mnimo en un perodo de 24 horas, hasta que comience a introducir slidos en su dieta, a los 6 meses de vida aproximadamente. EXTRACCIN DE LECHE MATERNA La extraccin y el almacenamiento de la leche materna le permiten asegurarse de que el beb se alimente exclusivamente de leche materna, aun en momentos en  los que no puede amamantar. Esto tiene especial importancia si debe regresar al trabajo en el perodo en que an est amamantando o si no puede estar presente en los momentos en que el beb debe alimentarse. Su asesor en lactancia puede orientarla sobre cunto tiempo es seguro almacenar leche materna.  El sacaleche es un aparato que le permite extraer leche de la mama a un recipiente estril. Luego, la leche materna extrada puede almacenarse en un refrigerador o congelador. Algunos sacaleches son manuales, mientras que otros son elctricos. Consulte a su asesor en lactancia qu tipo ser ms conveniente para usted. Los sacaleches se pueden comprar; sin embargo, algunos hospitales y grupos de apoyo a la lactancia materna alquilan sacaleches mensualmente. Un asesor en lactancia puede ensearle cmo extraer leche materna manualmente, en caso de que prefiera no usar un sacaleche.  CMO CUIDAR LAS MAMAS DURANTE LA LACTANCIA MATERNA Los pezones se secan, agrietan y duelen durante la lactancia materna. Las siguientes recomendaciones pueden ayudarla a mantener las mamas humectadas y sanas:  Evite usar jabn en los pezones.  Use un sostn de soporte. Aunque no son esenciales, las camisetas sin mangas o los sostenes especiales para amamantar estn diseados para acceder fcilmente a las mamas, para amamantar sin tener que quitarse todo   el sostn o la camiseta. Evite usar sostenes con aro o sostenes muy ajustados.  Seque al aire sus pezones durante 3 a 4minutos despus de amamantar al beb.  Utilice solo apsitos de algodn en el sostn para absorber las prdidas de leche. La prdida de un poco de leche materna entre las tomas es normal.  Utilice lanolina sobre los pezones luego de amamantar. La lanolina ayuda a mantener la humedad normal de la piel. Si usa lanolina pura, no tiene que lavarse los pezones antes de volver a alimentar al beb. La lanolina pura no es txica para el beb. Adems, puede extraer  manualmente algunas gotas de leche materna y masajear suavemente esa leche sobre los pezones, para que la leche se seque al aire. Durante las primeras semanas despus de dar a luz, algunas mujeres pueden experimentar hinchazn en las mamas (congestin mamaria). La congestin puede hacer que sienta las mamas pesadas, calientes y sensibles al tacto. El pico de la congestin ocurre dentro de los 3 a 5 das despus del parto. Las siguientes recomendaciones pueden ayudarla a aliviar la congestin:  Vace por completo las mamas al amamantar o extraer leche. Puede aplicar calor hmedo en las mamas (en la ducha o con toallas hmedas para manos) antes de amamantar o extraer leche. Esto aumenta la circulacin y ayuda a que la leche fluya. Si el beb no vaca por completo las mamas cuando lo amamanta, extraiga la leche restante despus de que haya finalizado.  Use un sostn ajustado (para amamantar o comn) o una camiseta sin mangas durante 1 o 2 das para indicar al cuerpo que disminuya ligeramente la produccin de leche.  Aplique compresas de hielo sobre las mamas, a menos que le resulte demasiado incmodo.  Asegrese de que el beb est prendido y se encuentre en la posicin correcta mientras lo alimenta. Si la congestin persiste luego de 48 horas o despus de seguir estas recomendaciones, comunquese con su mdico o un asesor en lactancia. RECOMENDACIONES GENERALES PARA EL CUIDADO DE LA SALUD DURANTE LA LACTANCIA MATERNA  Consuma alimentos saludables. Alterne comidas y colaciones, y coma 3 de cada una por da. Dado que lo que come afecta la leche materna, es posible que algunas comidas hagan que su beb se vuelva ms irritable de lo habitual. Evite comer este tipo de alimentos si percibe que afectan de manera negativa al beb.  Beba leche, jugos de fruta y agua para satisfacer su sed (aproximadamente 10 vasos al da).  Descanse con frecuencia, reljese y tome sus vitaminas prenatales para evitar la  fatiga, el estrs y la anemia.  Contine con los autocontroles de la mama.  Evite masticar y fumar tabaco.  Evite el consumo de alcohol y drogas. Algunos medicamentos, que pueden ser perjudiciales para el beb, pueden pasar a travs de la leche materna. Es importante que consulte a su mdico antes de tomar cualquier medicamento, incluidos todos los medicamentos recetados y de venta libre, as como los suplementos vitamnicos y herbales. Puede quedar embarazada durante la lactancia. Si desea controlar la natalidad, consulte a su mdico cules son las opciones ms seguras para el beb. SOLICITE ATENCIN MDICA SI:   Usted siente que quiere dejar de amamantar o se siente frustrada con la lactancia.  Siente dolor en las mamas o en los pezones.  Sus pezones estn agrietados o sangran.  Sus pechos estn irritados, sensibles o calientes.  Tiene un rea hinchada en cualquiera de las mamas.  Siente escalofros o fiebre.  Tiene nuseas o vmitos.    Presenta una secrecin de otro lquido distinto de la leche materna de los pezones.  Sus mamas no se llenan antes de amamantar al beb para el quinto da despus del parto.  Se siente triste y deprimida.  El beb est demasiado somnoliento como para comer bien.  El beb tiene problemas para dormir.  Moja menos de 3 paales en 24 horas.  Defeca menos de 3 veces en 24 horas.  La piel del beb o la parte [place] de los ojos se vuelven amarillentas.  El beb no ha aumentado de peso a los 5 das de vida. SOLICITE ATENCIN MDICA DE INMEDIATO SI:   El beb est muy cansado (letargo) y no se quiere despertar para comer.  Le sube la fiebre sin causa. Document Released: 01/06/2005 Document Revised: 01/11/2013 ExitCare Patient Information 2015 ExitCare, LLC. This information is not intended to replace advice given to you by your health care provider. Make sure you discuss any questions you have with your health care provider.  

## 2013-10-27 ENCOUNTER — Encounter: Payer: Self-pay | Admitting: *Deleted

## 2013-10-28 ENCOUNTER — Other Ambulatory Visit: Payer: Self-pay

## 2013-10-31 ENCOUNTER — Ambulatory Visit (INDEPENDENT_AMBULATORY_CARE_PROVIDER_SITE_OTHER): Payer: Self-pay | Admitting: Family Medicine

## 2013-10-31 ENCOUNTER — Encounter: Payer: Self-pay | Attending: Family Medicine | Admitting: *Deleted

## 2013-10-31 VITALS — BP 115/76 | HR 91 | Temp 97.4°F | Wt 179.4 lb

## 2013-10-31 DIAGNOSIS — O24419 Gestational diabetes mellitus in pregnancy, unspecified control: Secondary | ICD-10-CM | POA: Insufficient documentation

## 2013-10-31 DIAGNOSIS — Z3A00 Weeks of gestation of pregnancy not specified: Secondary | ICD-10-CM | POA: Insufficient documentation

## 2013-10-31 DIAGNOSIS — O99810 Abnormal glucose complicating pregnancy: Secondary | ICD-10-CM

## 2013-10-31 DIAGNOSIS — Z713 Dietary counseling and surveillance: Secondary | ICD-10-CM | POA: Insufficient documentation

## 2013-10-31 DIAGNOSIS — O0993 Supervision of high risk pregnancy, unspecified, third trimester: Secondary | ICD-10-CM

## 2013-10-31 LAB — POCT URINALYSIS DIP (DEVICE)
BILIRUBIN URINE: NEGATIVE
Glucose, UA: NEGATIVE mg/dL
Hgb urine dipstick: NEGATIVE
KETONES UR: NEGATIVE mg/dL
Nitrite: NEGATIVE
PH: 7 (ref 5.0–8.0)
Protein, ur: NEGATIVE mg/dL
Specific Gravity, Urine: 1.015 (ref 1.005–1.030)
Urobilinogen, UA: 1 mg/dL (ref 0.0–1.0)

## 2013-10-31 LAB — US OB FOLLOW UP

## 2013-10-31 NOTE — Patient Instructions (Signed)
Diabetes mellitus gestacional (Gestational Diabetes Mellitus) La diabetes mellitus gestacional, ms comnmente conocida como diabetes gestacional es un tipo de diabetes que desarrollan algunas mujeres durante el embarazo. En la diabetes gestacional, el pncreas no produce suficiente insulina (una hormona) o las clulas son menos sensibles a la insulina producida (resistencia a la insulina), o ambas cosas. Normalmente, la insulina mueve los azcares de los alimentos a las clulas de los tejidos. Las clulas de los tejidos utilizan los azcares para obtener energa. La falta de insulina o la falta de una respuesta normal a la insulina hace que el exceso de azcar se acumule en la sangre en lugar de penetrar en las clulas de los tejidos. Como resultado, se producen niveles altos de azcar en la sangre (hiperglucemia). El efecto de los niveles altos de azcar (glucosa) puede causar muchos problemas.  FACTORES DE RIESGO Usted tiene mayor probabilidad de desarrollar diabetes gestacional si tiene antecedentes familiares de diabetes y tambin si tiene uno o ms de los siguientes factores de riesgo:  ndice de masa corporal superior a 30 (obesidad).  Embarazo previo con diabetes gestacional.  La edad avanzada en el momento del embarazo. Si se mantienen los niveles de glucosa en la sangre en un rango normal durante el embarazo, las mujeres pueden tener un embarazo saludable. Si los niveles de glucosa en la sangre no estn bien controlados, puede haber riesgos para usted, el feto o el recin nacido, o durante el trabajo de parto y el parto.  SNTOMAS  Si se presentan sntomas, stos son similares a los sntomas que normalmente experimentar durante el embarazo. Los sntomas de la diabetes gestacional son:   Aumento de la sed (polidipsia).  Aumento de la miccin (poliuria).  Orina con ms frecuencia durante la noche (nocturia).  Prdida de peso. La prdida de peso puede ser muy rpida.  Infecciones  frecuentes y recurrentes.  Cansancio (fatiga).  Debilidad.  Cambios en la visin, como visin borrosa.  Olor a fruta en el aliento.  Dolor abdominal. DIAGNSTICO La diabetes se diagnostica cuando hay aumento de los niveles de glucosa en la sangre. El nivel de glucosa en la sangre puede controlarse en uno o ms de los siguientes anlisis de sangre:  Medicin de glucosa en la sangre en ayunas. No se le permitir comer durante al menos 8 horas antes de que se tome una muestra de sangre.  Pruebas al azar de glucosa en la sangre. El nivel de glucosa en la sangre se controla en cualquier momento del da sin importar el momento en que haya comido.  Prueba de A1c (hemoglobina glucosilada) Una prueba de A1c proporciona informacin sobre el control de la glucosa en la sangre durante los ltimos 3 meses.  Prueba de tolerancia a la glucosa oral (PTGO). La glucosa en la sangre se mide despus de no haber comido (ayunas) durante una a tres horas y despus de beber una bebida que contenga glucosa. Dado que las hormonas que causan la resistencia a la insulina son ms altas alrededor de las semanas 24 a 28 de embarazo, generalmente se realiza una PTGO durante ese tiempo. Si tiene factores de riesgo de diabetes gestacional, su mdico puede hacerle estudios de deteccin antes de las 24semanas de embarazo. TRATAMIENTO   Usted tendr que tomar medicamentos para la diabetes o insulina diariamente para mantener los niveles de glucosa en la sangre en el rango deseado.  Usted tendr que combinar la dosis de insulina con la actividad fsica y la eleccin de alimentos saludables. El objetivo del   tratamiento es mantener el nivel de azcar en la sangre previo a comer (preprandial) y durante la noche entre 60 y 99mg/dl, durante todo el embarazo. El objetivo del tratamiento es mantener el nivel pico de azcar en la sangre despus de comer (glucosa posprandial) entre 100y 140mg/dl. INSTRUCCIONES PARA EL CUIDADO EN EL  HOGAR   Controle su nivel de hemoglobina A1c dos veces al ao.  Contrlese a diario el nivel de glucosa en la sangre segn las indicaciones de su mdico. Es comn realizar controles frecuentes de la glucosa en la sangre.  Supervise las cetonas en la orina cuando est enferma y segn las indicaciones de su mdico.  Tome el medicamento para la diabetes y adminstrese insulina segn las indicaciones de su mdico para mantener el nivel de glucosa en la sangre en el rango deseado.  Nunca se quede sin medicamento para la diabetes o sin insulina. Es necesario que la reciba todos los das.  Ajuste la insulina segn la ingesta de hidratos de carbono. Los hidratos de carbono pueden aumentar los niveles de glucosa en la sangre, pero deben incluirse en su dieta. Los hidratos de carbono aportan vitaminas, minerales y fibra que son una parte esencial de una dieta saludable. Los hidratos de carbono se encuentran en frutas, verduras, cereales integrales, productos lcteos, legumbres y alimentos que contienen azcares aadidos.  Consuma alimentos saludables. Alterne 3 comidas con 3 colaciones.  Aumente de peso saludablemente. El aumento del peso total vara de acuerdo con el ndice de masa corporal que tena antes del embarazo (IMC).  Lleve una tarjeta de alerta mdica o use una pulsera o medalla de alerta mdica.  Lleve con usted una colacin de 15gramos de hidratos de carbono en todo momento para controlar los niveles bajos de glucosa en la sangre (hipoglucemia). Algunos ejemplos de colaciones de 15gramos de hidratos de carbono son los siguientes:  Tabletas de glucosa, 3 o 4.  Gel de glucosa, tubo de 15 gramos.  Pasas de uva, 2 cucharadas (24 g).  Caramelos de goma, 6.  Galletas de animales, 8.  Jugo de fruta, gaseosa comn, o leche descremada, 4 onzas (120 ml).  Pastillas de goma, 9.  Reconocer la hipoglucemia. Durante el embarazo la hipoglucemia se produce cuando hay niveles de glucosa en la  sangre de 60 mg/dl o menos. El riesgo de hipoglucemia aumenta durante el ayuno o cuando se saltea las comidas, durante o despus de realizar ejercicio intenso y mientras duerme. Los sntomas de hipoglucemia son:  Temblores o sacudidas.  Disminucin de la capacidad de concentracin.  Sudoracin.  Aumento de la frecuencia cardaca.  Dolor de cabeza.  Sequedad en la boca.  Hambre.  Irritabilidad.  Ansiedad.  Sueo agitado.  Alteracin del habla o de la coordinacin.  Confusin.  Tratar la hipoglucemia rpidamente. Si usted est alerta y puede tragar con seguridad, siga la regla de 15/15 que consiste en:  Tome entre 15 y 20gramos de glucosa de accin rpida o carbohidratos. Las opciones de accin rpida son un gel de glucosa, tabletas de glucosa, o 4 onzas (120 ml) de jugo de frutas, gaseosa comn, o leche baja en grasa.  Compruebe su nivel de glucosa en la sangre 15 minutos despus de tomar la glucosa.  Tome entre 15 y 20 gramos ms de glucosa si el nivel de glucosa en la sangre todava es de 70mg/dl o inferior.  Ingiera una comida o una colacin en el lapso de 1 hora una vez que los niveles de glucosa en la sangre vuelven   a la normalidad.  Est atento a la poliuria (miccin excesiva) y la polidipsia (sensacin de mucha sed), que son los primeros signos de la hiperglucemia. El reconocimiento temprano de la hiperglucemia permite un tratamiento oportuno. Trate la hiperglucemia segn le indic su mdico.  Haga actividad fsica por lo menos 30minutos al da o como lo indique su mdico. Se recomienda que 30 minutos despus de cada comida, realice diez minutos de actividad fsica para controlar los niveles de glucosa postprandial en la sangre.  Ajuste su dosis de insulina y la ingesta de alimentos, segn sea necesario, si inicia un nuevo ejercicio o deporte.  Siga su plan para los das de enfermedad cuando no pueda comer o beber como de costumbre.  Evite el tabaco y el  alcohol.  Concurra a todas las visitas de control como se lo haya indicado el mdico.  Siga el consejo del mdico respecto a los controles prenatales y posteriores al parto (postparto), las visitas, la planificacin de las comidas, el ejercicio, los medicamentos, las vitaminas, los anlisis de sangre, otras pruebas mdicas y actividades fsicas.  Realice diariamente el cuidado de la piel y de los pies. Examine su piel y los pies diariamente para ver si tiene cortes, moretones, enrojecimiento, problemas en las uas, sangrado, ampollas o llagas.  Cepllese los dientes y encas por lo menos dos veces al da y use hilo dental al menos una vez por da. Concurra regularmente a las visitas de control con el dentista.  Programe un examen de vista durante el primer trimestre de su embarazo o como lo indique su mdico.  Comparta su plan de control de diabetes en el trabajo o en la escuela.  Mantngase al da con las vacunas.  Aprenda a manejar el estrs.  Obtenga la mayor cantidad posible de informacin sobre la diabetes y solicite ayuda siempre que sea necesario.  Obtenga informacin sobre el amamantamiento y analice esta posibilidad.  Debe controlar el nivel de azcar en la sangre de 6a 12semanas despus del parto. Esto se hace con una prueba de tolerancia a la glucosa oral (PTGO). SOLICITE ATENCIN MDICA SI:   No puede comer alimentos o beber por ms de 6 horas.  Tuvo nuseas o ha vomitado durante ms de 6 horas.  Tiene un nivel de glucosa en la sangre de 200 mg/dl y cetonas en la orina.  Presenta algn cambio en el estado mental.  Desarrolla problemas de visin.  Sufre un dolor persistente de cabeza.  Siente dolor o molestias en la parte superior del abdomen.  Desarrolla una enfermedad grave adicional.  Tuvo diarrea durante ms de 6 horas.  Ha estado enfermo o ha tenido fiebre durante un par de das y no mejora. SOLICITE ATENCIN MDICA DE INMEDIATO SI:   Tiene dificultad  para respirar.  Ya no siente los movimientos del beb.  Est sangrando o tiene flujo vaginal.  Comienza a tener contracciones o trabajo de parto prematuro. ASEGRESE DE QUE:  Comprende estas instrucciones.  Controlar su afeccin.  Recibir ayuda de inmediato si no mejora o si empeora. Document Released: 10/16/2004 Document Revised: 05/23/2013 ExitCare Patient Information 2015 ExitCare, LLC. This information is not intended to replace advice given to you by your health care provider. Make sure you discuss any questions you have with your health care provider.  

## 2013-10-31 NOTE — Progress Notes (Signed)
True Track glucometer dispensed Lot: [id number], Exp: 2015/10/10, 1box lancets and 2 box strips. Patient states they were lost. We tested glucose this morning 11:50AM FBS 167mg /dl. She states she took her NPH. Since she did not eat she did not take her Novolog. Reviewed need for 4Xday testing and appropriate documentation. Reviewed insulin dosing/administration. Reinforced need to eat 3 meals and snacks each day.

## 2013-10-31 NOTE — Progress Notes (Signed)
Lost her purse recently, so needs log book for sugars, meter, strips & lancets; patient reports occasional cramping and pain in her sides/hips

## 2013-10-31 NOTE — Progress Notes (Signed)
Category 1 tracing with baseline in 130-140.  Moderate variability, multiple accelerations, no decelerations. AFI 11.4 Patient reports losing book and meter.  Will replace. Occasional cramps.  F/u in 1 week.

## 2013-11-03 ENCOUNTER — Ambulatory Visit (INDEPENDENT_AMBULATORY_CARE_PROVIDER_SITE_OTHER): Payer: Self-pay | Admitting: *Deleted

## 2013-11-03 VITALS — BP 111/73 | HR 83

## 2013-11-03 DIAGNOSIS — O99810 Abnormal glucose complicating pregnancy: Secondary | ICD-10-CM

## 2013-11-03 NOTE — Progress Notes (Signed)
NST reactive on 11/03/13 

## 2013-11-07 ENCOUNTER — Ambulatory Visit (INDEPENDENT_AMBULATORY_CARE_PROVIDER_SITE_OTHER): Payer: Self-pay | Admitting: Obstetrics & Gynecology

## 2013-11-07 VITALS — BP 123/77 | HR 87 | Temp 98.2°F | Wt 181.8 lb

## 2013-11-07 DIAGNOSIS — O99713 Diseases of the skin and subcutaneous tissue complicating pregnancy, third trimester: Secondary | ICD-10-CM

## 2013-11-07 DIAGNOSIS — Z23 Encounter for immunization: Secondary | ICD-10-CM

## 2013-11-07 DIAGNOSIS — O26893 Other specified pregnancy related conditions, third trimester: Secondary | ICD-10-CM

## 2013-11-07 DIAGNOSIS — O26613 Liver and biliary tract disorders in pregnancy, third trimester: Secondary | ICD-10-CM

## 2013-11-07 DIAGNOSIS — K831 Obstruction of bile duct: Secondary | ICD-10-CM

## 2013-11-07 DIAGNOSIS — O99810 Abnormal glucose complicating pregnancy: Secondary | ICD-10-CM

## 2013-11-07 DIAGNOSIS — L299 Pruritus, unspecified: Secondary | ICD-10-CM

## 2013-11-07 DIAGNOSIS — O0993 Supervision of high risk pregnancy, unspecified, third trimester: Secondary | ICD-10-CM

## 2013-11-07 LAB — POCT URINALYSIS DIP (DEVICE)
Bilirubin Urine: NEGATIVE
GLUCOSE, UA: NEGATIVE mg/dL
Hgb urine dipstick: NEGATIVE
KETONES UR: NEGATIVE mg/dL
Nitrite: NEGATIVE
Protein, ur: 30 mg/dL — AB
Specific Gravity, Urine: 1.02 (ref 1.005–1.030)
Urobilinogen, UA: 1 mg/dL (ref 0.0–1.0)
pH: 7 (ref 5.0–8.0)

## 2013-11-07 LAB — COMPREHENSIVE METABOLIC PANEL
ALT: 108 U/L — ABNORMAL HIGH (ref 0–35)
AST: 54 U/L — ABNORMAL HIGH (ref 0–37)
Albumin: 3 g/dL — ABNORMAL LOW (ref 3.5–5.2)
Alkaline Phosphatase: 202 U/L — ABNORMAL HIGH (ref 39–117)
BUN: 10 mg/dL (ref 6–23)
CALCIUM: 8.9 mg/dL (ref 8.4–10.5)
CO2: 20 meq/L (ref 19–32)
Chloride: 103 mEq/L (ref 96–112)
Creat: 0.5 mg/dL (ref 0.50–1.10)
Glucose, Bld: 104 mg/dL — ABNORMAL HIGH (ref 70–99)
Potassium: 3.5 mEq/L (ref 3.5–5.3)
SODIUM: 138 meq/L (ref 135–145)
TOTAL PROTEIN: 5.9 g/dL — AB (ref 6.0–8.3)
Total Bilirubin: 0.4 mg/dL (ref 0.2–1.2)

## 2013-11-07 LAB — US OB FOLLOW UP

## 2013-11-07 LAB — GLUCOSE, CAPILLARY: Glucose-Capillary: 112 mg/dL — ABNORMAL HIGH (ref 70–99)

## 2013-11-07 MED ORDER — HYDROXYZINE HCL 25 MG PO TABS: 25.0000 mg | ORAL_TABLET | Freq: Four times a day (QID) | ORAL | Status: DC | PRN

## 2013-11-07 MED ORDER — ASPIRIN 81 MG PO CHEW: 81.0000 mg | CHEWABLE_TABLET | Freq: Every day | ORAL | Status: DC

## 2013-11-07 MED ORDER — PREDNISONE 20 MG PO TABS: 20.0000 mg | ORAL_TABLET | Freq: Every day | ORAL | Status: DC

## 2013-11-07 NOTE — Progress Notes (Signed)
Patient is Spanish-speaking only, Spanish interpreter present for this encounter. Patient reports diffuse itching; has rash visible around eyes.  Cholestasis labs checked; prescribed Prednisone x 20 mg po qd and Atarax prn Forgot log book, will bring it next visit. Random CBG today 112. NST performed today was reviewed and was found to be reactive.  Continue recommended antenatal testing and prenatal care. No other complaints or concerns.  Labor and fetal movement precautions reviewed.

## 2013-11-07 NOTE — Patient Instructions (Signed)
Return to clinic for any obstetric concerns or go to [facility] for evaluation  

## 2013-11-07 NOTE — Progress Notes (Signed)
Marlynn PerkingMaria Elena used as interpreter for this encounter.  C/o itchiness around eyes and skin.

## 2013-11-08 ENCOUNTER — Telehealth: Payer: Self-pay | Admitting: *Deleted

## 2013-11-08 ENCOUNTER — Encounter: Payer: Self-pay | Admitting: Obstetrics & Gynecology

## 2013-11-08 DIAGNOSIS — K831 Obstruction of bile duct: Secondary | ICD-10-CM | POA: Insufficient documentation

## 2013-11-08 DIAGNOSIS — O99713 Diseases of the skin and subcutaneous tissue complicating pregnancy, third trimester: Principal | ICD-10-CM

## 2013-11-08 DIAGNOSIS — L299 Pruritus, unspecified: Secondary | ICD-10-CM

## 2013-11-08 DIAGNOSIS — O26619 Liver and biliary tract disorders in pregnancy, unspecified trimester: Secondary | ICD-10-CM

## 2013-11-08 DIAGNOSIS — O26613 Liver and biliary tract disorders in pregnancy, third trimester: Secondary | ICD-10-CM

## 2013-11-08 LAB — BILE ACIDS, TOTAL: BILE ACIDS TOTAL: 175 umol/L — AB (ref 0–19)

## 2013-11-08 MED ORDER — URSODIOL 300 MG PO CAPS: 300.0000 mg | ORAL_CAPSULE | Freq: Three times a day (TID) | ORAL | Status: DC

## 2013-11-08 MED ORDER — URSODIOL 500 MG PO TABS: 500.0000 mg | ORAL_TABLET | Freq: Two times a day (BID) | ORAL | Status: DC

## 2013-11-08 NOTE — Addendum Note (Signed)
Addended by: Jaynie CollinsANYANWU, UGONNA A on: 11/08/2013 09:44 AM   Modules accepted: Orders

## 2013-11-08 NOTE — Progress Notes (Signed)
Quick Note:  Given elevated level of bile acids of 175, I talked to Berna SpareJosh Nitsche, MD (Maternal Fetal Medicine) who recommended that patient needs a GI consult to evaluate for other possible contributing factors to her significantly elevated bile acids and also to probably recommend any additional therapy if indicated. Will also check acute Hepatitis panel when patient comes to clinic on 11/10/13 Thursday for her testing. Given increased bile acid levels, delivery will be at 36 weeks. Please arrange for GI consult and call patient; please call to inform patient of results and recommendations. Patient is Spanish speaking only. At her 11/10/13 appointment, please add her on to my schedule after her NST (scheduled at 2 pm) so I can discuss all these findings and recommendations face to face.  As per UpToDate: Higher serum total bile acid concentration appears to predict fetal adverse outcome in ICP. The risk of fetal complications was statistically increased at bile acid levels =/>40 micromol/L. A retrospective study found high fetal complication rates, but weak association between complications rates (eg, respiratory distress syndrome, meconium-stained amniotic fluid, fetal distress) and bile acid concentration levels. In this study, the complication rates according to total bile acid concentration levels were <10 micromol/L: 5/18 (28 percent), 10 to 40 micromol/L: 10/34 (29 percent), 40 to 100 micromol/L: 3/16 (19 percent), and >100 micromol/L: 3/5 (60 percent). The risk for fetal demise also appears to be increased with higher bile acid levels. In a large prospective cohort study of women with ICP and total bile acid concentrations =/>40 micromol/L, the incidence of stillbirth was 1.5 percent (10/669), which was higher than the control population incidence of 0.5 percent (11/2206).  In this study, 6 of 10 stillbirths occurred before 37 weeks of gestation, with a median gestational age at delivery of 362 days  and median total bile acid levels of 137 micromol/L. Another retrospective study, which included 215 women with ICP, observed two sudden intrauterine fetal deaths of unclear etiology in 4221 women with bile acid levels =/> 100 micromol/L and no fetal deaths among women with lower bile acid levels. For significantly elevated bile acids, consider delivery at or before 36 weeks.   Jaynie CollinsUGONNA Daril Warga, MD, FACOG Attending Obstetrician & Gynecologist [facility]   ______

## 2013-11-08 NOTE — Progress Notes (Addendum)
Addendum: Patient has cholestasis of pregnancy as evidenced by elevated LFTs  (AST 54 ALT 108).  Ursodiol prescribed, patient already has Atarax prescribed.  Also, already in 2x/week testing and getting serial ultrasounds but will need delivery at 37 weeks.  Patient will be called and informed of this diagnosis and recommendations.

## 2013-11-08 NOTE — Telephone Encounter (Addendum)
Called pt with [employer] # [phone].  He left message stating that we are calling with lab results information and recommendations from the doctor.  Please call back. **Pt needs to be informed that she has Cholestasis of pregnancy. She will require medication which is very expensive but she is eligible [facility] special low [facility] pricing [facility]. For this reason, her prescription has been sent to that location. Pt will need to be informed of pharmacy location and/or directions. Her cost will be 30-35 dollars. At [facility], this medication costs over $250. If she has additional questions, they can be answered at her next appt on 10/22. 10/21  1435  Called pt w/interpreter Darletta Mollorita Arias and left message that I am calling again with important information. We will discuss in detail at her clinic appt tomorrow at 2pm. **Dr. Macon LargeAnyanwu plans to explain new diagnosis to pt and plan for delivery @ 36 wks.  10/22  1410  Pt arrived for scheduled clinic visit- see notes per Dr. Macon LargeAnyanwu

## 2013-11-10 ENCOUNTER — Ambulatory Visit (INDEPENDENT_AMBULATORY_CARE_PROVIDER_SITE_OTHER): Payer: Self-pay | Admitting: Obstetrics & Gynecology

## 2013-11-10 DIAGNOSIS — K831 Obstruction of bile duct: Secondary | ICD-10-CM

## 2013-11-10 DIAGNOSIS — O26613 Liver and biliary tract disorders in pregnancy, third trimester: Secondary | ICD-10-CM

## 2013-11-10 DIAGNOSIS — O99810 Abnormal glucose complicating pregnancy: Secondary | ICD-10-CM

## 2013-11-10 MED ORDER — URSODIOL 300 MG PO CAPS: 300.0000 mg | ORAL_CAPSULE | Freq: Three times a day (TID) | ORAL | Status: DC

## 2013-11-10 NOTE — Progress Notes (Signed)
Patient is Spanish-speaking only, Spanish interpreter present for this encounter. Discussed diagnosis of cholestasis with patient; need for treatment with Ursodiol, continued fetal surveillance and need for delivery at 36 week given markedly increased bile acids. Discussed increased risk of IUFD with increased bile acids. [facility] about her elevated bile acid levels of 175, and elevated LFTs (AST 54, ALT 108). He recommended checking hepatis panel, coagulation studies and repeat LFTs; these were ordered for today.  Will follow up results and manage accordingly.  Already discussed this case with Dr. Rema FendtJoshua Nitsche (MFM) who agrees with the above recommendations and plan for delivery at 36 weeks. IOL scheduled on 11/20/2013. Patient instructed to take Ursodiol immediately, continue Atarax and Prednisone for itching Strict fetal movement precautions reviewed. NST performed today was reviewed and was found to be reactive.  Continue recommended antenatal testing and prenatal care. Forgot log book again today.  Continue Glyburide 2.5 mg daily.  Reevaluate at next visit.  Labor precautions reviewed.

## 2013-11-10 NOTE — Patient Instructions (Signed)
Colestasis del embarazo (Cholestasis of Pregnancy) El trmino colestasis hace referencia a cualquier afeccin que provoca el enlentecimiento o la interrupcin del flujo del lquido de la digestin (bilis) que el Gainesborohgado fabrica. La colestasis del embarazo es ms frecuente hacia el final del Psychiatristembarazo (tercertrimestre), pero puede presentarse en cualquier momento durante la gestacin. Con frecuencia, la afeccin desaparece tan pronto como nace el beb.  La colestasis puede causar molestias, pero a menudo no tiene efectos nocivos para usted; sin embargo, puede ser daina para el beb. La colestasis puede aumentar el riesgo de que el beb nazca demasiado pronto (parto prematuro).  CAUSAS  La causa de la colestasis del embarazo no se conoce. Las hormonas del embarazo pueden afectar el funcionamiento de la vescula biliar. Normalmente, la vescula biliar contiene la bilis que el hgado fabrica hasta que se la necesita para ayudar a Location managerdigerir las grasas de la dieta. Las hormonas del Biomedical scientistembarazo pueden enlentecer el flujo de la bilis y hacer que retroceda al hgado. Luego, la bilis ingresa al torrente sanguneo y provoca sntomas de colestasis. FACTORES DE RIESGO Puede correr ms riesgo si:  Tuvo colestasis durante un embarazo anterior.  Tiene antecedentes familiares de colestasis.  Tiene problemas de hgado.  Espera gemelos. SIGNOS Y SNTOMAS  El sntoma ms frecuente de la colestasis del embarazo es la picazn intensa, especialmente de las palmas de las manos y las plantas de los pies. La picazn puede extenderse al resto del cuerpo y suele ser peor por la noche. Generalmente, no tendr una erupcin cutnea. Otros sntomas son:   Cansancio.  Coloracin amarillenta de la piel y la parte Jamile de los ojos (ictericia).  Orina de color oscuro.  Heces de color claro.  Prdida del apetito. DIAGNSTICO  El mdico revisar sus antecedentes mdicos y le har un examen fsico. Tal vez le hagan anlisis de  sangre para controlar la funcin heptica, el nivel de bilis y de bilirrubina.  TRATAMIENTO  El objetivo del tratamiento es hacer que se sienta ms cmoda y proteger al beb. El mdico puede recetar medicamentos para Production designer, theatre/television/filmcalmar la picazn. El medicamento que se [place] tambin puede Temple-Inlandmejorar los resultados de los anlisis de sangre y Contractorayudar a Conservator, museum/galleryproteger al beb. Adems, el mdico puede darle vitaminaK antes del parto para evitar el sangrado excesivo.  Es posible que el mdico quiera controlar frecuentemente al beb (monitoreo fetal), por ejemplo, cada 2semanas. Una vez que los pulmones del beb estn suficientemente desarrollados, el mdico puede recomendar el inicio (induccin) del [place] y el parto en la semana37de embarazo. INSTRUCCIONES PARA EL CUIDADO EN EL HOGAR   Use las cremas para Associate Professoraliviar la picazn y tome los medicamentos solamente como se lo haya indicado el mdico.  Tome baos de inmersin en agua fra para Associate Professoraliviar la picazn.  Mantenga las uas cortas para no irritar la piel al rascarse.  Concurra a todas las visitas de monitoreo fetal. SOLICITE ATENCIN MDICA SI:  Los sntomas empeoran a pesar del TEFL teachertratamiento. SOLICITE ATENCIN MDICA DE INMEDIATO SI: Comienza el trabajo de parto prematuro en su casa. ASEGRESE DE QUE:  Comprende estas instrucciones.  Controlar su afeccin.  Recibir ayuda de inmediato si no mejora o si empeora. Document Released: 10/16/2004 Document Revised: 05/23/2013 [facility] Patient Information 2015 [place], [place]. This information is not intended to replace advice given to you by your health care provider. Make sure you discuss any questions you have with your health care provider.

## 2013-11-10 NOTE — Progress Notes (Signed)
Interpreter Martha [name] present for visit today.  Dr. Macon LargeAnyanwu in to explain diagnosis of Cholestasis and plan of care.

## 2013-11-11 ENCOUNTER — Encounter: Payer: Self-pay | Admitting: Obstetrics & Gynecology

## 2013-11-11 LAB — COMPREHENSIVE METABOLIC PANEL
ALBUMIN: 3 g/dL — AB (ref 3.5–5.2)
ALT: 118 U/L — ABNORMAL HIGH (ref 0–35)
AST: 57 U/L — AB (ref 0–37)
Alkaline Phosphatase: 242 U/L — ABNORMAL HIGH (ref 39–117)
BUN: 5 mg/dL — AB (ref 6–23)
CALCIUM: 8.5 mg/dL (ref 8.4–10.5)
CHLORIDE: 106 meq/L (ref 96–112)
CO2: 19 meq/L (ref 19–32)
Creat: 0.43 mg/dL — ABNORMAL LOW (ref 0.50–1.10)
Glucose, Bld: 80 mg/dL (ref 70–99)
POTASSIUM: 3.8 meq/L (ref 3.5–5.3)
Sodium: 133 mEq/L — ABNORMAL LOW (ref 135–145)
Total Bilirubin: 0.5 mg/dL (ref 0.2–1.2)
Total Protein: 5.9 g/dL — ABNORMAL LOW (ref 6.0–8.3)

## 2013-11-11 LAB — HEPATITIS PANEL, ACUTE
HCV Ab: NEGATIVE
Hep A IgM: NONREACTIVE
Hep B C IgM: NONREACTIVE
Hepatitis B Surface Ag: NEGATIVE

## 2013-11-11 LAB — PROTIME-INR
INR: 0.91 (ref <1.50)
Prothrombin Time: 12.3 seconds (ref 11.6–15.2)

## 2013-11-11 LAB — APTT: aPTT: 30 seconds (ref 24–37)

## 2013-11-14 ENCOUNTER — Ambulatory Visit (INDEPENDENT_AMBULATORY_CARE_PROVIDER_SITE_OTHER): Payer: Self-pay | Admitting: Family Medicine

## 2013-11-14 ENCOUNTER — Other Ambulatory Visit: Payer: Self-pay | Admitting: Family Medicine

## 2013-11-14 VITALS — BP 120/76 | HR 78 | Temp 98.5°F | Wt 182.3 lb

## 2013-11-14 DIAGNOSIS — O26613 Liver and biliary tract disorders in pregnancy, third trimester: Secondary | ICD-10-CM

## 2013-11-14 DIAGNOSIS — K831 Obstruction of bile duct: Secondary | ICD-10-CM

## 2013-11-14 DIAGNOSIS — O0993 Supervision of high risk pregnancy, unspecified, third trimester: Secondary | ICD-10-CM

## 2013-11-14 DIAGNOSIS — O99810 Abnormal glucose complicating pregnancy: Secondary | ICD-10-CM

## 2013-11-14 DIAGNOSIS — Z23 Encounter for immunization: Secondary | ICD-10-CM

## 2013-11-14 LAB — POCT URINALYSIS DIP (DEVICE)
Bilirubin Urine: NEGATIVE
Glucose, UA: NEGATIVE mg/dL
Hgb urine dipstick: NEGATIVE
Ketones, ur: NEGATIVE mg/dL
Nitrite: NEGATIVE
PROTEIN: NEGATIVE mg/dL
SPECIFIC GRAVITY, URINE: 1.015 (ref 1.005–1.030)
Urobilinogen, UA: 0.2 mg/dL (ref 0.0–1.0)
pH: 7 (ref 5.0–8.0)

## 2013-11-14 LAB — OB RESULTS CONSOLE GC/CHLAMYDIA
CHLAMYDIA, DNA PROBE: NEGATIVE
Gonorrhea: NEGATIVE

## 2013-11-14 LAB — OB RESULTS CONSOLE GBS: GBS: NEGATIVE

## 2013-11-14 LAB — US OB FOLLOW UP

## 2013-11-14 MED ORDER — INSULIN ASPART 100 UNIT/ML ~~LOC~~ SOLN: 10.0000 [IU] | Freq: Three times a day (TID) | SUBCUTANEOUS | Status: DC

## 2013-11-14 MED ORDER — INSULIN NPH (HUMAN) (ISOPHANE) 100 UNIT/ML ~~LOC~~ SUSP
12.0000 [IU] | Freq: Every day | SUBCUTANEOUS | Status: DC
Start: 2013-11-14 — End: 2013-11-23

## 2013-11-14 NOTE — Progress Notes (Signed)
IOL scheduled 11/1 due to severe cholestasis.

## 2013-11-14 NOTE — Addendum Note (Signed)
Addended by: Levie HeritageSTINSON, Onita Pfluger J on: 11/14/2013 11:17 AM   Modules accepted: Orders

## 2013-11-14 NOTE — Patient Instructions (Signed)
Tercer trimestre del embarazo  (Third Trimester of Pregnancy)  El tercer trimestre del embarazo abarca desde la semana 29 hasta la semana 42, desde el 7 mes hasta el 9. En este trimestre el beb (feto) se desarrolla muy rpidamente. Hacia el final del noveno mes, el beb que an no ha nacido mide alrededor de 20 pulgadas (45 cm) de largo. Y pesa entre 6 y 10 libras (2,700 y 4,500 kg).  CUIDADOS EN EL HOGAR   Evite fumar, consumir hierbas y beber alcohol. Evite los frmacos que no apruebe el mdico.  Slo tome los medicamentos que le haya indicado su mdico. Algunos medicamentos son seguros para tomar durante el embarazo y otros no lo son.  Haga ejercicios slo como le indique el mdico. Deje de hacer ejercicios si comienza a tener clicos.  Haga comidas regulares y sanas.  Use un sostn que le brinde buen soporte si sus mamas estn sensibles.  No utilice la baera con agua caliente, baos turcos y saunas.  Colquese el cinturn de seguridad cuando conduzca.  Evite comer carne cruda y el contacto con los utensilios y desperdicios de los gatos.  Tome las vitaminas indicadas para la etapa prenatal.  Trate de tomar medicamentos para mover el intestino (laxantes) segn lo necesario y si su mdico la autoriza. Consuma ms fibra comiendo frutas y vegetales frescos y granos enteros. Beba gran cantidad de lquido para mantener el pis (orina) de tono claro o amarillo plido.  Tome baos de agua tibia (baos de asiento) para calmar el dolor o las molestias causadas por las hemorroides. Use una crema para las hemorroides si el mdico la autoriza.  Si tiene venas hinchadas y abultadas (venas varicosas), use medias de soporte. Eleve (levante) los pies durante 15 minutos, 3 o 4 veces por da. Limite el consumo de sal en su dieta.  Evite levantar objetos pesados, usar tacones altos y sintese derecha.  Descanse con las piernas elevadas si tiene calambres o dolor de cintura.  Visite a su dentista  si no lo ha hecho durante el embarazo. Use un cepillo de dientes blando para higienizarse los dientes. Use suavemente el hilo dental.  Puede tener sexo (relaciones sexuales) siempre que el mdico la autorice.  No viaje por largas distancias si puede evitarlo. Slo hgalo con la aprobacin de su mdico.  Haga el curso pre parto.  Practique conducir hasta el hospital.  Prepare el bolso que llevar.  Prepare la habitacin del beb.  Concurra a los controles mdicos. SOLICITE AYUDA SI:   No est segura si est en trabajo de parto o ha roto la bolsa de aguas.  Tiene mareos.  Siente clicos intensos o presin en la zona baja del vientre (abdomen).  Siente un dolor persistente en la zona del vientre.  Tiene malestar estomacal (nuseas), devuelve (vomita), o tiene deposiciones acuosas (diarrea).  Advierte un olor ftido que proviene de la vagina.  Siente dolor al hacer pis (orinar). SOLICITE AYUDA DE INMEDIATO SI:   Tiene fiebre.  Pierde lquido o sangre por la vagina.  Tiene sangrando o pequeas prdidas vaginales.  Siente dolor intenso o clicos en el abdomen.  Sube o baja de peso rpidamente.  Tiene dificultad para respirar o siente dolor en el pecho.  Sbitamente se le hinchan el rostro, las manos, los tobillos, los pies o las piernas.  No ha sentido los movimientos del beb durante una hora.  Siente un dolor de cabeza intenso que no se alivia con medicamentos.  Su visin se modifica. Document   Released: 09/08/2012 [employer] Patient Information 2015 [employer]. This information is not intended to replace advice given to you by your health care provider. Make sure you discuss any questions you have with your health care provider.  

## 2013-11-14 NOTE — Progress Notes (Signed)
Category 1 tracing with baseline in 130s.  Moderate variability, multiple accelerations, no decelerations. Fasting CBG 100-112.  Increase NPH to 12. 2hr PP 117-158 in evening.  Increase Aspart to 12 units with dinner. Itching has subsided.  Still on prednisone for 3 days. Induction scheduled 11/1 for cholestasis.

## 2013-11-14 NOTE — Progress Notes (Signed)
Used Interpreter Siera Linder.  

## 2013-11-15 ENCOUNTER — Other Ambulatory Visit: Payer: Self-pay

## 2013-11-15 LAB — GC/CHLAMYDIA PROBE AMP
CT Probe RNA: NEGATIVE
GC PROBE AMP APTIMA: NEGATIVE

## 2013-11-16 ENCOUNTER — Telehealth (HOSPITAL_COMMUNITY): Payer: Self-pay | Admitting: *Deleted

## 2013-11-16 LAB — CULTURE, BETA STREP (GROUP B ONLY)

## 2013-11-16 NOTE — Telephone Encounter (Signed)
Preadmission screen Interpreter number [phone]

## 2013-11-17 ENCOUNTER — Encounter: Payer: Self-pay | Admitting: Family Medicine

## 2013-11-18 ENCOUNTER — Ambulatory Visit (INDEPENDENT_AMBULATORY_CARE_PROVIDER_SITE_OTHER): Payer: Self-pay | Admitting: *Deleted

## 2013-11-18 VITALS — BP 127/84 | HR 78

## 2013-11-18 DIAGNOSIS — K831 Obstruction of bile duct: Secondary | ICD-10-CM

## 2013-11-18 DIAGNOSIS — O26613 Liver and biliary tract disorders in pregnancy, third trimester: Secondary | ICD-10-CM

## 2013-11-18 DIAGNOSIS — O99810 Abnormal glucose complicating pregnancy: Secondary | ICD-10-CM

## 2013-11-18 NOTE — Progress Notes (Signed)
IOL scheduled on 11/1

## 2013-11-20 ENCOUNTER — Encounter (HOSPITAL_COMMUNITY): Payer: Self-pay

## 2013-11-20 ENCOUNTER — Inpatient Hospital Stay (HOSPITAL_COMMUNITY)
Admission: RE | Admit: 2013-11-20 | Discharge: 2013-11-23 | DRG: 775 | Disposition: A | Discharge status: Home / Self Care | Payer: Medicaid Other | Source: Ambulatory Visit | Attending: Obstetrics & Gynecology | Admitting: Obstetrics & Gynecology

## 2013-11-20 VITALS — BP 140/65 | HR 84 | Temp 98.6°F | Resp 18 | Ht 60.0 in | Wt 182.0 lb

## 2013-11-20 DIAGNOSIS — Z6835 Body mass index (BMI) 35.0-35.9, adult: Secondary | ICD-10-CM

## 2013-11-20 DIAGNOSIS — O2662 Liver and biliary tract disorders in childbirth: Principal | ICD-10-CM | POA: Diagnosis present

## 2013-11-20 DIAGNOSIS — F329 Major depressive disorder, single episode, unspecified: Secondary | ICD-10-CM | POA: Diagnosis present

## 2013-11-20 DIAGNOSIS — O99810 Abnormal glucose complicating pregnancy: Secondary | ICD-10-CM

## 2013-11-20 DIAGNOSIS — Z9049 Acquired absence of other specified parts of digestive tract: Secondary | ICD-10-CM | POA: Diagnosis present

## 2013-11-20 DIAGNOSIS — Z3A36 36 weeks gestation of pregnancy: Secondary | ICD-10-CM | POA: Diagnosis present

## 2013-11-20 DIAGNOSIS — O99214 Obesity complicating childbirth: Secondary | ICD-10-CM | POA: Diagnosis present

## 2013-11-20 DIAGNOSIS — O133 Gestational [pregnancy-induced] hypertension without significant proteinuria, third trimester: Secondary | ICD-10-CM

## 2013-11-20 DIAGNOSIS — O0993 Supervision of high risk pregnancy, unspecified, third trimester: Secondary | ICD-10-CM

## 2013-11-20 DIAGNOSIS — O26613 Liver and biliary tract disorders in pregnancy, third trimester: Secondary | ICD-10-CM

## 2013-11-20 DIAGNOSIS — E669 Obesity, unspecified: Secondary | ICD-10-CM | POA: Diagnosis present

## 2013-11-20 DIAGNOSIS — O24424 Gestational diabetes mellitus in childbirth, insulin controlled: Secondary | ICD-10-CM | POA: Diagnosis present

## 2013-11-20 DIAGNOSIS — K831 Obstruction of bile duct: Secondary | ICD-10-CM

## 2013-11-20 DIAGNOSIS — O99344 Other mental disorders complicating childbirth: Secondary | ICD-10-CM

## 2013-11-20 LAB — PROTEIN / CREATININE RATIO, URINE
CREATININE, URINE: 87.11 mg/dL
Protein Creatinine Ratio: 0.29 — ABNORMAL HIGH (ref 0.00–0.15)
TOTAL PROTEIN, URINE: 25.5 mg/dL

## 2013-11-20 LAB — CBC
HCT: 31.5 % — ABNORMAL LOW (ref 36.0–46.0)
Hemoglobin: 10.8 g/dL — ABNORMAL LOW (ref 12.0–15.0)
MCH: 29.3 pg (ref 26.0–34.0)
MCHC: 34.3 g/dL (ref 30.0–36.0)
MCV: 85.6 fL (ref 78.0–100.0)
Platelets: 259 10*3/uL (ref 150–400)
RBC: 3.68 MIL/uL — ABNORMAL LOW (ref 3.87–5.11)
RDW: 12.7 % (ref 11.5–15.5)
WBC: 6.5 10*3/uL (ref 4.0–10.5)

## 2013-11-20 LAB — COMPREHENSIVE METABOLIC PANEL
ALT: 43 U/L — ABNORMAL HIGH (ref 0–35)
ANION GAP: 13 (ref 5–15)
AST: 33 U/L (ref 0–37)
Albumin: 2.3 g/dL — ABNORMAL LOW (ref 3.5–5.2)
Alkaline Phosphatase: 256 U/L — ABNORMAL HIGH (ref 39–117)
BILIRUBIN TOTAL: 0.4 mg/dL (ref 0.3–1.2)
BUN: 11 mg/dL (ref 6–23)
CALCIUM: 9.7 mg/dL (ref 8.4–10.5)
CHLORIDE: 101 meq/L (ref 96–112)
CO2: 19 meq/L (ref 19–32)
CREATININE: 0.51 mg/dL (ref 0.50–1.10)
GFR calc Af Amer: >90 mL/min (ref >90)
Glucose, Bld: 99 mg/dL (ref 70–99)
Potassium: 4.3 mEq/L (ref 3.7–5.3)
Sodium: 133 mEq/L — ABNORMAL LOW (ref 137–147)
Total Protein: 6.2 g/dL (ref 6.0–8.3)

## 2013-11-20 LAB — GLUCOSE, CAPILLARY
GLUCOSE-CAPILLARY: 120 mg/dL — AB (ref 70–99)
GLUCOSE-CAPILLARY: 164 mg/dL — AB (ref 70–99)
Glucose-Capillary: 157 mg/dL — ABNORMAL HIGH (ref 70–99)
Glucose-Capillary: 89 mg/dL (ref 70–99)
Glucose-Capillary: 94 mg/dL (ref 70–99)

## 2013-11-20 LAB — GLUCOSE, RANDOM: Glucose, Bld: 97 mg/dL (ref 70–99)

## 2013-11-20 LAB — ABO/RH: ABO/RH(D): O POS

## 2013-11-20 LAB — HIV ANTIBODY (ROUTINE TESTING W REFLEX): HIV: NONREACTIVE

## 2013-11-20 LAB — TYPE AND SCREEN
ABO/RH(D): O POS
Antibody Screen: NEGATIVE

## 2013-11-20 LAB — RPR

## 2013-11-20 MED ORDER — LACTATED RINGERS IV SOLN: 500.0000 mL | INTRAVENOUS | Status: DC | PRN

## 2013-11-20 MED ORDER — TERBUTALINE SULFATE 1 MG/ML IJ SOLN: 0.2500 mg | Freq: Once | INTRAMUSCULAR | Status: AC | PRN

## 2013-11-20 MED ORDER — OXYTOCIN BOLUS FROM INFUSION
500.0000 mL | INTRAVENOUS | Status: DC
  Administered 2013-11-21: 500 mL via INTRAVENOUS

## 2013-11-20 MED ORDER — MISOPROSTOL 25 MCG QUARTER TABLET: 25.0000 ug | ORAL_TABLET | ORAL | Status: DC | PRN

## 2013-11-20 MED ORDER — ONDANSETRON HCL 4 MG/2ML IJ SOLN: 4.0000 mg | Freq: Four times a day (QID) | INTRAMUSCULAR | Status: DC | PRN

## 2013-11-20 MED ORDER — CITRIC ACID-SODIUM CITRATE 334-500 MG/5ML PO SOLN: 30.0000 mL | ORAL | Status: DC | PRN

## 2013-11-20 MED ORDER — LIDOCAINE HCL (PF) 1 % IJ SOLN
30.0000 mL | INTRAMUSCULAR | Status: AC | PRN
  Administered 2013-11-21: 30 mL via SUBCUTANEOUS
  Filled 2013-11-20: qty 30

## 2013-11-20 MED ORDER — FLEET ENEMA 7-19 GM/118ML RE ENEM: 1.0000 | ENEMA | Freq: Once | RECTAL | Status: DC

## 2013-11-20 MED ORDER — LIDOCAINE HCL (PF) 1 % IJ SOLN
30.0000 mL | INTRAMUSCULAR | Status: DC | PRN
  Filled 2013-11-20: qty 30

## 2013-11-20 MED ORDER — ACETAMINOPHEN 325 MG PO TABS: 650.0000 mg | ORAL_TABLET | ORAL | Status: DC | PRN

## 2013-11-20 MED ORDER — HYDROXYZINE HCL 25 MG PO TABS
25.0000 mg | ORAL_TABLET | Freq: Four times a day (QID) | ORAL | Status: DC | PRN
  Administered 2013-11-20: 25 mg via ORAL
  Filled 2013-11-20 (×3): qty 1

## 2013-11-20 MED ORDER — OXYCODONE-ACETAMINOPHEN 5-325 MG PO TABS: 2.0000 | ORAL_TABLET | ORAL | Status: DC | PRN

## 2013-11-20 MED ORDER — URSODIOL 300 MG PO CAPS
300.0000 mg | ORAL_CAPSULE | Freq: Three times a day (TID) | ORAL | Status: DC
  Administered 2013-11-20 – 2013-11-21 (×4): 300 mg via ORAL
  Filled 2013-11-20 (×7): qty 1

## 2013-11-20 MED ORDER — OXYTOCIN 40 UNITS IN LACTATED RINGERS INFUSION - SIMPLE MED
1.0000 m[IU]/min | INTRAVENOUS | Status: DC
  Administered 2013-11-20: 2 m[IU]/min via INTRAVENOUS
  Filled 2013-11-20: qty 1000

## 2013-11-20 MED ORDER — LACTATED RINGERS IV SOLN: INTRAVENOUS | Status: DC

## 2013-11-20 MED ORDER — OXYTOCIN 40 UNITS IN LACTATED RINGERS INFUSION - SIMPLE MED: 62.5000 mL/h | INTRAVENOUS | Status: DC

## 2013-11-20 MED ORDER — OXYCODONE-ACETAMINOPHEN 5-325 MG PO TABS: 1.0000 | ORAL_TABLET | ORAL | Status: DC | PRN

## 2013-11-20 MED ORDER — OXYTOCIN BOLUS FROM INFUSION
500.0000 mL | INTRAVENOUS | Status: DC
Start: 2013-11-20 — End: 2013-11-21

## 2013-11-20 MED ORDER — LACTATED RINGERS IV SOLN
INTRAVENOUS | Status: DC
  Administered 2013-11-20: 900 mL via INTRAVENOUS
  Administered 2013-11-20 – 2013-11-21 (×2): via INTRAVENOUS

## 2013-11-20 NOTE — Progress Notes (Signed)
Assisted RN with interpretation patient care  Spanish Interpreter - Benita Mordecai MaesSanchez

## 2013-11-20 NOTE — Progress Notes (Signed)
Assisted RN with patient assessment and update. Spanish Interpreter - Joselyn GlassmanBenita Sanchez

## 2013-11-20 NOTE — Progress Notes (Signed)
Patient ID: Martha [name], female   DOB: 09/26/1985, 28 y.o.   MRN: [id number] Martha [name] is a 28 y.o. [phone] at 4429w0d.  Subjective: Increased cramping w/ UC's   Objective: BP 120/82 mmHg  Pulse 59  Temp(Src) 98.5 F (36.9 C) (Oral)  Resp 18  Ht 5' (1.524 m)  Wt 82.555 kg (182 lb)  BMI 35.54 kg/m2   FHT:  FHR: 130 bpm, variability: mod,  accelerations:  15x15,  decelerations:  none UC:   Q 3-5 minutes, Mild-mod  Dilation: 3 Effacement (%): 50, 60 Cervical Position: Middle Station: -2 Presentation: Vertex Exam by:: Vmith cnm  Labs: Results for orders placed or performed during the hospital encounter of 11/20/13 (from the past 24 hour(s))  CBC     Status: Abnormal   Collection Time: 11/20/13  9:00 AM  Result Value Ref Range   WBC 6.5 4.0 - 10.5 K/uL   RBC 3.68 (L) 3.87 - 5.11 MIL/uL   Hemoglobin 10.8 (L) 12.0 - 15.0 g/dL   HCT 14.731.5 (L) 82.936.0 - 56.246.0 %   MCV 85.6 78.0 - 100.0 fL   MCH 29.3 26.0 - 34.0 pg   MCHC 34.3 30.0 - 36.0 g/dL   RDW 13.012.7 86.511.5 - 78.415.5 %   Platelets 259 150 - 400 K/uL  RPR     Status: None   Collection Time: 11/20/13  9:00 AM  Result Value Ref Range   RPR NON REAC NON REAC  HIV antibody     Status: None   Collection Time: 11/20/13  9:00 AM  Result Value Ref Range   HIV 1&2 Ab, 4th Generation NONREACTIVE NONREACTIVE  Type and screen     Status: None   Collection Time: 11/20/13  9:00 AM  Result Value Ref Range   ABO/RH(D) O POS    Antibody Screen NEG    Sample Expiration 11/23/2013   Glucose, random     Status: None   Collection Time: 11/20/13  9:24 AM  Result Value Ref Range   Glucose, Bld 97 70 - 99 mg/dL  Glucose, capillary     Status: None   Collection Time: 11/20/13 11:51 AM  Result Value Ref Range   Glucose-Capillary 94 70 - 99 mg/dL  Glucose, capillary     Status: Abnormal   Collection Time: 11/20/13  4:05 PM  Result Value Ref Range   Glucose-Capillary 120 (H) 70 - 99 mg/dL   Comment 1 Notify RN     Assessment /  Plan: 2029w0d week IUP Labor: Early Fetal Wellbeing:  Category I Pain Control:  None Anticipated MOD:  NSVD Continue increasing pitocin CBG stable off glucostabilizer.  FabricaVirginia Laksh Hinners, CNM 11/20/2013 4:43 PM
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
## 2013-11-20 NOTE — Progress Notes (Signed)
Discussed CBG no new orders

## 2013-11-20 NOTE — Progress Notes (Signed)
Checked on patient and ordered clear liquids  Spanish Interpreter - Joselyn GlassmanBenita [name]

## 2013-11-21 ENCOUNTER — Encounter (HOSPITAL_COMMUNITY): Payer: Self-pay

## 2013-11-21 DIAGNOSIS — Z3A36 36 weeks gestation of pregnancy: Secondary | ICD-10-CM

## 2013-11-21 DIAGNOSIS — O9962 Diseases of the digestive system complicating childbirth: Secondary | ICD-10-CM

## 2013-11-21 DIAGNOSIS — O2482 Other pre-existing diabetes mellitus in childbirth: Secondary | ICD-10-CM

## 2013-11-21 LAB — GLUCOSE, CAPILLARY
Glucose-Capillary: 116 mg/dL — ABNORMAL HIGH (ref 70–99)
Glucose-Capillary: 84 mg/dL (ref 70–99)
Glucose-Capillary: 83 mg/dL (ref 70–99)
Glucose-Capillary: 71 mg/dL (ref 70–99)

## 2013-11-21 MED ORDER — LANOLIN HYDROUS EX OINT: TOPICAL_OINTMENT | CUTANEOUS | Status: DC | PRN

## 2013-11-21 MED ORDER — OXYTOCIN 40 UNITS IN LACTATED RINGERS INFUSION - SIMPLE MED
1.0000 m[IU]/min | INTRAVENOUS | Status: DC
  Administered 2013-11-21: 2 m[IU]/min via INTRAVENOUS
  Filled 2013-11-21: qty 1000

## 2013-11-21 MED ORDER — IBUPROFEN 600 MG PO TABS
600.0000 mg | ORAL_TABLET | Freq: Four times a day (QID) | ORAL | Status: DC
  Administered 2013-11-21 – 2013-11-23 (×7): 600 mg via ORAL
  Filled 2013-11-21 (×7): qty 1

## 2013-11-21 MED ORDER — BENZOCAINE-MENTHOL 20-0.5 % EX AERO
1.0000 "application " | INHALATION_SPRAY | CUTANEOUS | Status: DC | PRN
  Administered 2013-11-22: 1 via TOPICAL
  Filled 2013-11-21: qty 56

## 2013-11-21 MED ORDER — PRENATAL MULTIVITAMIN CH
1.0000 | ORAL_TABLET | Freq: Every day | ORAL | Status: DC
Start: 2013-11-22 — End: 2013-11-23
  Administered 2013-11-22: 1 via ORAL
  Filled 2013-11-21: qty 1

## 2013-11-21 MED ORDER — TERBUTALINE SULFATE 1 MG/ML IJ SOLN: 0.2500 mg | Freq: Once | INTRAMUSCULAR | Status: DC | PRN

## 2013-11-21 MED ORDER — ZOLPIDEM TARTRATE 5 MG PO TABS: 5.0000 mg | ORAL_TABLET | Freq: Every evening | ORAL | Status: DC | PRN

## 2013-11-21 MED ORDER — OXYCODONE-ACETAMINOPHEN 5-325 MG PO TABS
2.0000 | ORAL_TABLET | ORAL | Status: DC | PRN
  Administered 2013-11-22: 2 via ORAL
  Filled 2013-11-21: qty 2

## 2013-11-21 MED ORDER — MISOPROSTOL 25 MCG QUARTER TABLET
25.0000 ug | ORAL_TABLET | ORAL | Status: DC
  Administered 2013-11-21 (×2): 25 ug via VAGINAL
  Filled 2013-11-21: qty 1
  Filled 2013-11-21: qty 0.25
  Filled 2013-11-21: qty 1
  Filled 2013-11-21: qty 0.25
  Filled 2013-11-21 (×2): qty 1

## 2013-11-21 MED ORDER — WITCH HAZEL-GLYCERIN EX PADS: 1.0000 "application " | MEDICATED_PAD | CUTANEOUS | Status: DC | PRN

## 2013-11-21 MED ORDER — SENNOSIDES-DOCUSATE SODIUM 8.6-50 MG PO TABS
2.0000 | ORAL_TABLET | ORAL | Status: DC
  Administered 2013-11-22 – 2013-11-23 (×2): 2 via ORAL
  Filled 2013-11-21: qty 2

## 2013-11-21 MED ORDER — OXYCODONE-ACETAMINOPHEN 5-325 MG PO TABS
1.0000 | ORAL_TABLET | ORAL | Status: DC | PRN
  Administered 2013-11-21 – 2013-11-23 (×3): 1 via ORAL
  Filled 2013-11-21 (×2): qty 1

## 2013-11-21 MED ORDER — FENTANYL CITRATE 0.05 MG/ML IJ SOLN: 100.0000 ug | Freq: Once | INTRAMUSCULAR | Status: DC

## 2013-11-21 MED ORDER — DIPHENHYDRAMINE HCL 25 MG PO CAPS: 25.0000 mg | ORAL_CAPSULE | Freq: Four times a day (QID) | ORAL | Status: DC | PRN

## 2013-11-21 MED ORDER — ONDANSETRON HCL 4 MG PO TABS: 4.0000 mg | ORAL_TABLET | ORAL | Status: DC | PRN

## 2013-11-21 MED ORDER — ONDANSETRON HCL 4 MG/2ML IJ SOLN: 4.0000 mg | INTRAMUSCULAR | Status: DC | PRN

## 2013-11-21 MED ORDER — SIMETHICONE 80 MG PO CHEW: 80.0000 mg | CHEWABLE_TABLET | ORAL | Status: DC | PRN

## 2013-11-21 MED ORDER — DIBUCAINE 1 % RE OINT: 1.0000 "application " | TOPICAL_OINTMENT | RECTAL | Status: DC | PRN

## 2013-11-21 MED ORDER — TETANUS-DIPHTH-ACELL PERTUSSIS 5-2.5-18.5 LF-MCG/0.5 IM SUSP: 0.5000 mL | Freq: Once | INTRAMUSCULAR | Status: DC

## 2013-11-21 NOTE — Progress Notes (Signed)
Patient ID: Martha [name], female   DOB: 04/17/1985, 28 y.o.   MRN: [id number] Martha [name] is a 28 y.o. [phone] at 3359w1d admitted for IOL due to cholestasis  Subjective: Requests IV analgesia  Objective: BP 115/75 mmHg  Pulse 102  Temp(Src) 98.7 F (37.1 C) (Oral)  Resp 18  Ht 5' (1.524 m)  Wt 82.555 kg (182 lb)  BMI 35.54 kg/m2 Filed Vitals:   11/21/13 1658 11/21/13 1659 11/21/13 1729 11/21/13 1731  BP:  128/93 146/95 115/75  Pulse:  80 74 102  Temp:   98.7 F (37.1 C)   TempSrc:   Oral   Resp: 18  18   Height:      Weight:       Fetal Heart FHR: 140 bpm, variability: moderate,  accelerations:  Present,  decelerations:  Absent   Contractions: q 2-3  VE: 6-7/90/-2 AROM> clear  Assessment / Plan:  Labor: active Fetal Wellbeing: Cat 1 Pain Control:  Fentanyl ordered Expected mode of delivery: NSVD  Martha [name] 11/21/2013, 6:15 PM

## 2013-11-21 NOTE — Progress Notes (Signed)
Nursery RN at bedside with Mom and infant. Baby skin to skin.

## 2013-11-21 NOTE — Progress Notes (Signed)
Martha [name] is a 28 y.o. [phone] at 7366w1d admitted for induction of labor due to severe cholestasis.  Subjective: Pt comfortable with mild intermittent back pain.  S/O at bedside.  Hospital interpreter used for all communication.  Objective: BP 115/81 mmHg  Pulse 64  Temp(Src) 98.4 F (36.9 C) (Oral)  Resp 18  Ht 5' (1.524 m)  Wt 82.555 kg (182 lb)  BMI 35.54 kg/m2      FHT:  FHR: 135 bpm, variability: moderate,  accelerations:  Present,  decelerations:  Absent UC:   irregular, every 2-6 minutes SVE:   Dilation: 3 Effacement (%): 50 Station: -2 Exam by:: Collene GobbleLisa Leftwich CNM  Labs: Lab Results  Component Value Date   WBC 6.5 11/20/2013   HGB 10.8* 11/20/2013   HCT 31.5* 11/20/2013   MCV 85.6 11/20/2013   PLT 259 11/20/2013    Assessment / Plan: Induction of labor due to cholestasis No labor progress on Pitocin  Labor: Plan to D/C Pitocin and place Cytotec vaginal 25 mcg Q 4 hours for cervical ripening  Blood glucose elevated x 2 hours following high sugar snack.  CBG 89 at 4 hour recheck.  Pt to eat light meal now, recheck in 4 hours. Preeclampsia:  labs stable Fetal Wellbeing:  Category I Pain Control:  Labor support without medications I/D:  n/a Anticipated MOD:  NSVD  LEFTWICH-KIRBY, LISA 11/21/2013, 12:07 AM

## 2013-11-21 NOTE — Progress Notes (Signed)
Martha [name] is a 28 y.o. [phone] at 4971w1d admitted for induction of labor due to severe cholestasis.  Subjective: Pt comfortable with mild intermittent back pain.  S/O in room for support.  All communication with hospital interpreter.  Objective: BP 115/81 mmHg  Pulse 64  Temp(Src) 98.4 F (36.9 C) (Oral)  Resp 18  Ht 5' (1.524 m)  Wt 82.555 kg (182 lb)  BMI 35.54 kg/m2      FHT:  FHR: 135 bpm, variability: moderate,  accelerations:  Present,  decelerations:  Absent UC:   irregular, every 2-6 minutes SVE:  Deferred  Labs: Lab Results  Component Value Date   WBC 6.5 11/20/2013   HGB 10.8* 11/20/2013   HCT 31.5* 11/20/2013   MCV 85.6 11/20/2013   PLT 259 11/20/2013    Assessment / Plan: Induction of labor due to cholestasis,  progressing well on pitocin  Labor: Progressing normally Preeclampsia:  P/C ratio 0.29, pt asymtomatic Fetal Wellbeing:  Category I Pain Control:  Labor support without medications I/D:  n/a Anticipated MOD:  NSVD  [name], Martha Valeriano 11/21/2013, 12:04 AM

## 2013-11-21 NOTE — Progress Notes (Signed)
Patient ID: Martha [name], female   DOB: 10/02/1985, 28 y.o.   MRN: [id number] Martha [name] is a 28 y.o. [phone] at 5727w1d admitted for IOL indicated by cholestasis of pregnancy, A2/B DM  Subjective: Comfortable. S/P pitocin, then cytotec x2  Objective: BP 138/68 mmHg  Pulse 69  Temp(Src) 98.4 F (36.9 C) (Oral)  Resp 20  Ht 5' (1.524 m)  Wt 82.555 kg (182 lb)  BMI 35.54 kg/m2  Fetal Heart FHR: 145 bpm, variability: moderate,  accelerations:  Present,  decelerations:  Absent   Contractions: q 5min, mild, regular  SVE at 1020:   Dilation: 3 Effacement (%): 60 Station: -2 Exam by:: Martha Ipatherine Stout RN   Results for orders placed or performed during the hospital encounter of 11/20/13 (from the past 24 hour(s))  Glucose, capillary     Status: None   Collection Time: 11/20/13 11:51 AM  Result Value Ref Range   Glucose-Capillary 94 70 - 99 mg/dL  Glucose, capillary     Status: Abnormal   Collection Time: 11/20/13  4:05 PM  Result Value Ref Range   Glucose-Capillary 120 (H) 70 - 99 mg/dL   Comment 1 Notify RN   Glucose, capillary     Status: Abnormal   Collection Time: 11/20/13  8:04 PM  Result Value Ref Range   Glucose-Capillary 157 (H) 70 - 99 mg/dL  Protein / creatinine ratio, urine     Status: Abnormal   Collection Time: 11/20/13  8:10 PM  Result Value Ref Range   Creatinine, Urine 87.11 mg/dL   Total Protein, Urine 25.5 mg/dL   Protein Creatinine Ratio 0.29 (H) 0.00 - 0.15  Glucose, capillary     Status: Abnormal   Collection Time: 11/20/13  9:18 PM  Result Value Ref Range   Glucose-Capillary 164 (H) 70 - 99 mg/dL  Glucose, capillary     Status: None   Collection Time: 11/20/13 11:33 PM  Result Value Ref Range   Glucose-Capillary 89 70 - 99 mg/dL  Glucose, capillary     Status: Abnormal   Collection Time: 11/21/13  4:21 AM  Result Value Ref Range   Glucose-Capillary 116 (H) 70 - 99 mg/dL  Glucose, capillary     Status: None   Collection Time: 11/21/13   7:45 AM  Result Value Ref Range   Glucose-Capillary 84 70 - 99 mg/dL   Assessment / Plan: G9/FA2/B DM> consider glucose stabilizer when in labor Labor: cx ripened, will restart pitocin Fetal Wellbeing: Category 1 Pain Control:  n/a Expected mode of delivery: NSVD  Martha [name] 11/21/2013, 10:24 AM

## 2013-11-21 NOTE — Plan of Care (Signed)
Problem: Phase I Progression Outcomes Goal: Pain controlled with appropriate interventions Outcome: Completed/Met Date Met:  11/21/13 Goal: VS, stable, temp < 100.4 degrees F Outcome: Completed/Met Date Met:  11/21/13 Goal: Initial discharge plan identified Outcome: Completed/Met Date Met:  11/21/13  Problem: Phase II Progression Outcomes Goal: Tolerating diet Outcome: Completed/Met Date Met:  11/21/13

## 2013-11-21 NOTE — Progress Notes (Signed)
Martha [name] is a 28 y.o. [phone] at 7625w1d admitted for induction of labor due to cholestasis of pregnancy.  Subjective: Pt comfortable without medication. S/O in room for support.  Objective: BP 118/83 mmHg  Pulse 77  Temp(Src) 98.6 F (37 C) (Oral)  Resp 18  Ht 5' (1.524 m)  Wt 82.555 kg (182 lb)  BMI 35.54 kg/m2      FHT:  FHR: 135 bpm, variability: moderate,  accelerations:  Present,  decelerations:  Absent UC:   regular, every 5-6 minutes SVE:   Dilation: 3.5 Effacement (%): 50 Station: -3 Exam by:: Xcel EnergyLisa Leftwich-Kirby    Labs: Lab Results  Component Value Date   WBC 6.5 11/20/2013   HGB 10.8* 11/20/2013   HCT 31.5* 11/20/2013   MCV 85.6 11/20/2013   PLT 259 11/20/2013    Assessment / Plan: Induction of labor due to cholestasis  Labor: Progressing normally  Placed Cytotec second dose at this time. Preeclampsia:  n/a Fetal Wellbeing:  Category I Pain Control:  Labor support without medications I/D:  n/a Anticipated MOD:  NSVD  LEFTWICH-KIRBY, LISA 11/21/2013, 4:29 AM

## 2013-11-22 ENCOUNTER — Encounter (HOSPITAL_COMMUNITY): Payer: Self-pay

## 2013-11-22 LAB — CBC
HEMATOCRIT: 24.4 % — AB (ref 36.0–46.0)
HEMOGLOBIN: 8.2 g/dL — AB (ref 12.0–15.0)
MCH: 28.9 pg (ref 26.0–34.0)
MCHC: 33.6 g/dL (ref 30.0–36.0)
MCV: 85.9 fL (ref 78.0–100.0)
Platelets: 208 10*3/uL (ref 150–400)
RBC: 2.84 MIL/uL — AB (ref 3.87–5.11)
RDW: 12.7 % (ref 11.5–15.5)
WBC: 9.9 10*3/uL (ref 4.0–10.5)

## 2013-11-22 LAB — GLUCOSE, CAPILLARY: Glucose-Capillary: 100 mg/dL — ABNORMAL HIGH (ref 70–99)

## 2013-11-22 NOTE — Progress Notes (Signed)
UR completed 

## 2013-11-22 NOTE — Plan of Care (Signed)
Problem: Discharge Progression Outcomes Goal: Tolerating diet Outcome: Completed/Met Date Met:  11/22/13     

## 2013-11-22 NOTE — Plan of Care (Signed)
Problem: Consults Goal: Postpartum Patient Education (See Patient Education module for education specifics.)  Outcome: Completed/Met Date Met:  11/22/13  Problem: Phase I Progression Outcomes Goal: Voiding adequately Outcome: Completed/Met Date Met:  11/22/13 Goal: OOB as tolerated unless otherwise ordered Outcome: Completed/Met Date Met:  11/22/13  Problem: Phase II Progression Outcomes Goal: Pain controlled on oral analgesia Outcome: Completed/Met Date Met:  11/22/13 Goal: Progress activity as tolerated unless otherwise ordered Outcome: Completed/Met Date Met:  11/22/13 Goal: Afebrile, VS remain stable Outcome: Completed/Met Date Met:  11/22/13

## 2013-11-22 NOTE — Progress Notes (Signed)
Post Partum Day 1 Subjective: no complaints, up ad lib, voiding, tolerating PO and + flatus.  Pt doing well. Breastfeeding successfully. Undecided re birth control.   Objective: Blood pressure 108/57, pulse 67, temperature 98.8 F (37.1 C), temperature source Oral, resp. rate 18, height 5' (1.524 m), weight 82.555 kg (182 lb), SpO2 94 %, unknown if currently breastfeeding.  Physical Exam:  General: alert, cooperative and no distress Lochia: appropriate Uterine Fundus: firm DVT Evaluation: No evidence of DVT seen on physical exam.   Recent Labs  11/20/13 0900 11/22/13 0600  HGB 10.8* 8.2*  HCT 31.5* 24.4*    Assessment/Plan: Plan for discharge tomorrow   LOS: 2 days   Aldona BarKoch, Kari L 11/22/2013, 7:32 AM   OB fellow attestation Post Partum Day 1 I have seen and examined this patient and agree with above documentation in the resident's note.   Joaquin BendBlanca E Ramos Soc is a 28 y.o. [phone] s/p NSVD.  Pt denies problems with ambulating, voiding or po intake. Pain is well controlled.  Plan for birth control is undecided.  Method of Feeding: breast  PE:  BP 134/78 mmHg  Pulse 80  Temp(Src) 98 F (36.7 C) (Oral)  Resp 18  Ht 5' (1.524 m)  Wt 182 lb (82.555 kg)  BMI 35.54 kg/m2  SpO2 100%  Breastfeeding? Unknown Fundus firm  Plan for discharge: PPD2  William DaltonMcEachern, Grasiela Jonsson, MD 9:04 AM

## 2013-11-22 NOTE — Lactation Note (Signed)
This note was copied from the chart of Martha [name]. Lactation Consultation Note: Lactation Brochure given to mother in [place]. Spanish Interpreter at bedside. Mother states she breastfed her 28 year old for 3 years, her 159 and 28 year old for 2 years each.her other children were term.  Mother states that her infant is feeding well. She was taught hand expression and observed large amts of colostrum. Assist mother with latching infant on the (R) breast. Mother was taught to use cross cradle hold. She was advised to wait for her infant to open his mouth wide for deep latch. Observed infant with good burst of suckling and a few swallows. Advised mother to offer EBM/formula for supplement. Reviewed Baby and Me book in Spanish. Mother was sat up with a DEBP due to infants gestational age of [redacted] weeks. Observed mother pumping with #24 flanges. Mother pumped 5 ml . Mother was given parent instruction sheet for LPI and reviewed LPI behaviors. Reviewed collection and storage of breastmilk in Spanish Baby and me book.  Mother advised in supplementing infant . Mother is active with WIC. Mother to page as needed for follow up .    Patient Name: Martha [name] [id number]'s Date: 11/22/2013 Reason for consult: Initial assessment   Maternal Data Has patient been taught Hand Expression?: Yes (observed large amts of colostrum) Does the patient have breastfeeding experience prior to this delivery?: Yes  Feeding Feeding Type: Breast Fed Length of feed: 15 min  LATCH Score/Interventions Latch: Grasps breast easily, tongue down, lips flanged, rhythmical sucking.  Audible Swallowing: A few with stimulation Intervention(s): Skin to skin;Hand expression  Type of Nipple: Everted at rest and after stimulation Intervention(s): Double electric pump  Comfort (Breast/Nipple): Soft / non-tender     Hold (Positioning): Assistance needed to correctly position infant at breast and maintain  latch. Intervention(s): Support Pillows;Position options;Skin to skin  LATCH Score: 8  Lactation Tools Discussed/Used WIC Program: Yes Pump Review: Setup, frequency, and cleaning;Milk Storage Initiated by:: Staff nurse Date initiated:: 11/22/13   Consult Status Consult Status: Follow-up Date: 11/22/13 Follow-up type: In-patient    Stevan BornKendrick, Zakkery Dorian [facility] 11/22/2013, 11:55 AM

## 2013-11-22 NOTE — Progress Notes (Signed)
Interpreter in to see patient. Plan of care explained today. Patient educated on breastfeeding protocols and pain medicines and what to expect today. All questions answered patient is aware of her plan of care.

## 2013-11-22 NOTE — Progress Notes (Addendum)
I assisted Gafferarah RN with some questions about pain level, also I assisted Network engineerMiranda RN with baby assessment( glucose check), by Orlan LeavensViria Alvarez, Interpreter

## 2013-11-23 LAB — CBC
HEMATOCRIT: 25 % — AB (ref 36.0–46.0)
Hemoglobin: 8.5 g/dL — ABNORMAL LOW (ref 12.0–15.0)
MCH: 29.6 pg (ref 26.0–34.0)
MCHC: 34 g/dL (ref 30.0–36.0)
MCV: 87.1 fL (ref 78.0–100.0)
Platelets: 253 10*3/uL (ref 150–400)
RBC: 2.87 MIL/uL — AB (ref 3.87–5.11)
RDW: 12.9 % (ref 11.5–15.5)
WBC: 10.3 10*3/uL (ref 4.0–10.5)

## 2013-11-23 MED ORDER — HYDROXYZINE HCL 25 MG PO TABS
25.0000 mg | ORAL_TABLET | Freq: Four times a day (QID) | ORAL | Status: DC | PRN
  Filled 2013-11-23: qty 1

## 2013-11-23 MED ORDER — OXYCODONE-ACETAMINOPHEN 5-325 MG PO TABS: 1.0000 | ORAL_TABLET | ORAL | Status: DC | PRN

## 2013-11-23 MED ORDER — HYDROXYZINE HCL 25 MG PO TABS
25.0000 mg | ORAL_TABLET | Freq: Three times a day (TID) | ORAL | Status: DC | PRN
  Administered 2013-11-23: 25 mg via ORAL
  Filled 2013-11-23 (×2): qty 1

## 2013-11-23 MED ORDER — METHYLERGONOVINE MALEATE 0.2 MG PO TABS
0.2000 mg | ORAL_TABLET | ORAL | Status: DC | PRN
  Administered 2013-11-23: 0.2 mg via ORAL
  Filled 2013-11-23: qty 1

## 2013-11-23 NOTE — Progress Notes (Signed)
Clinical Social Work Department PSYCHOSOCIAL ASSESSMENT - MATERNAL/CHILD 11/23/2013  Patient:  Martha [name], Martha [name]  Account Number:  [id number]  Admit Date:  01/28/2008  Ardine Eng Name:   Martha [name]   Clinical Social Worker:  Lucita Ferrara, CLINICAL SOCIAL WORKER   Date/Time:  11/23/2013 09:15 AM  Date Referred:  11/21/2013   Referral source  Central Nursery     Referred reason  Depression/Anxiety   Other referral source:    I:  FAMILY / [place] legal guardian:  PARENT  Guardian - Name Guardian - Age [place] [address], [place] [id number]   Other household support members/support persons Name Relationship DOB  Billy SON 34 years old   Other support:   [name] identified her husband as her primary support person.    II  PSYCHOSOCIAL DATA Information Source:  Patient Interview  Occupational hygienist Employment:   CSW did not inquire about employment status.   Financial resources:  Self Pay If Medicaid - County:  [place] Other  [place] / Grade:  N/A Music therapist / Child Services Coordination / Early Interventions:   None reported  Cultural issues impacting care:   None reported    III  STRENGTHS Strengths  Adequate Resources  Home prepared for Child (including basic supplies)   Strength comment:    IV  RISK FACTORS AND CURRENT PROBLEMS Current Problem:  YES   Risk Factor & Current Problem Patient Issue Family Issue Risk Factor / Current Problem Comment  Other - See comment Y N [name] is currently grieving as she has had 2 siblings die during her pregnancy.    V  SOCIAL WORK ASSESSMENT CSW met with the [name] in her room in order to complete the assessment. Consult was ordered due to [name] presenting with a history of depression.  [name] was easily engaged and was receptive to the visit.  Assessment was completed with assistance of hospital interpreter.  [name] presented with an  appropriate range in affect and presented in a pleasant mood.  [name]'s affect and mood appeared congruent with content of assessment as she avoided eye contact and had a flattened affect as she discussed death of her siblings.  [name] displayed ability to self-regulate at the end of assessment, as she was able to smile and reflect upon what she is grateful for her in her life.    [name] expressed excitement upon the birth of her baby, but did express some feelings of worry since it has "been a long time" since she has had a newborn in her home. She stated that she has a 28 year old and a 28 year old that currently live in [place] with her family, and a 28 year old that lives with her.  CSW validated and normalized the [name]'s feelings as she reflected upon how she feels secondary to separation from her children; however, she stated that she talks to her children frequently and believes that God is watching over her children.    Without prompting, [name] transitioned to discussing her sadness that she has experienced during her pregnancy.  CSW validated her feelings and assisted her to begin to process her thoughts and feelings related to the death of her brother and her sister.  [name] primarily focused on the death of her sister, as her sister was murdered "72 days ago".  She stated that she continues to cry on a regular basis since her  sister had been living with her and she is reminded of her death everyday at home.  [name] expressed additional sadness since her sister cannot meet the baby, and stated that her sister had been excited for the baby.  [name] shared that she had spoken only one time to a professional about her feelings, and denied desire to speak to someone about it on a regular basis.  She stated that she would prefer to speak to her husband about it.  [name] verbalized lack of readiness to address the grief and loss since she is not ready to confront the feelings of sadness.  [name] presented with insight on potential  negative consequences if grief is not discussed, but continued to verbalize lack of readiness of therapy.  [name] denied any other symptoms of depression, and stated that she has only been "crying a lot".  [name] expressed feeling proud of herself when she reflects upon her experiences during the pregnancy, and she stated that she is excited to return home.   [name] acknowledged increased risk for developing postpartum depression.  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  [name] denied receiving treatment for symptoms, and stated that she is aware of what to monitor for.  [name] verbalized awareness of her ability to receive medical treatment at the health department if she experiences symptoms.   No barriers to discharge.    VI SOCIAL WORK PLAN Social Work Plan  No Further Intervention Required / No Barriers to Discharge  Patient/Family Education   Type of pt/family education:   Postpartum depression   If child protective services report - county:   If child protective services report - date:   Information/referral to community resources comment:   [name] declined referral for therapy as she does not feel like she is ready to discuss her grief with anyone outside of her family.   Other social work plan:   CSW to follow up PRN.

## 2013-11-23 NOTE — Plan of Care (Signed)
Problem: Discharge Progression Outcomes Goal: Activity appropriate for discharge plan Outcome: Completed/Met Date Met:  11/23/13 Goal: Afebrile, VS remain stable at discharge Outcome: Completed/Met Date Met:  11/23/13

## 2013-11-23 NOTE — Discharge Summary (Signed)
Obstetric Discharge Summary Reason for Admission: induction of labor Prenatal Procedures: none Intrapartum Procedures: spontaneous vaginal delivery Postpartum Procedures: none Complications-Operative and Postpartum: none HEMOGLOBIN  Date Value Ref Range Status  11/23/2013 8.5* 12.0 - 15.0 g/dL Final   HCT  Date Value Ref Range Status  11/23/2013 25.0* 36.0 - 46.0 % Final    Physical Exam:  General: alert, cooperative and no distress Lochia: appropriate Uterine Fundus: firm  DVT Evaluation: No evidence of DVT seen on physical exam.  Discharge Diagnoses: Term Pregnancy-delivered  Discharge Information: Date: 11/23/2013 Activity: pelvic rest Diet: routine Medications: Ibuprofen and Percocet Condition: stable Instructions: refer to practice specific booklet Discharge to: home   Newborn Data: Live born female  Birth Weight: 7 lb 15.3 oz (3609 g) APGAR: 7, 8  Home with mother.  Aldona BarKoch, Kari L 11/23/2013, 7:57 AM   OB fellow attestation Post Partum Day 2 I have seen and examined this patient and agree with above documentation in the resident's note.   Joaquin BendBlanca E Ramos Soc is a 28 y.o. [phone] s/p NSVD PPD#2.  Pt denies problems with ambulating, voiding or po intake. Pain is well controlled.  PE:  BP 140/65 mmHg  Pulse 84  Temp(Src) 98.6 F (37 C) (Oral)  Resp 18  Ht 5' (1.524 m)  Wt 182 lb (82.555 kg)  BMI 35.54 kg/m2  SpO2 100%  Breastfeeding? Unknown Fundus firm  Plan for discharge: today  Perry MountACOSTA,Mariluz Crespo ROCIO, MD 8:20 PM

## 2013-11-23 NOTE — Lactation Note (Signed)
This note was copied from the chart of Boy Marianne SofiaBlanca Ramos Soc. Lactation Consultation Note  Follow up consult with this mom of a late pre tem baby, now 5341 hours old, under double phototherapy. He ahs been breast feeding every 1-2 hours. Mom has easily expressed colostrum, but has very tender breasts, and does not like to hand express for this reason. I had mom pump with DEP, and she pumped 10 mls, and the baby fed with bottle and took 10 mls in about 2 minutes. Mom to pump every 3 and bottle feed what she expressed. Mom may also breast feed, but this is difficult with photo blankets. Mom knows to call for questions/concenrs.  Patient Name: Boy Marianne SofiaBlanca Ramos Soc ZOXWR'UToday's Date: 11/23/2013 Reason for consult: Follow-up assessment   Maternal Data    Feeding Feeding Type: Breast Milk Length of feed: 2 min  LATCH Score/Interventions                      Lactation Tools Discussed/Used Pump Review: Setup, frequency, and cleaning;Milk Storage;Other (comment) (part care) Initiated by:: bedsie rn   Consult Status Consult Status: Follow-up Date: 11/24/13 Follow-up type: In-patient    Alfred LevinsLee, Keysha Damewood Anne 11/23/2013, 12:13 PM

## 2013-11-23 NOTE — Plan of Care (Signed)
Problem: Discharge Progression Outcomes Goal: Discharge plan in place and appropriate Outcome: Completed/Met Date Met:  11/23/13     

## 2013-11-23 NOTE — Progress Notes (Signed)
NST reviewed and reactive.  Martha [name], M.D., FACOG    

## 2013-11-23 NOTE — Plan of Care (Signed)
Problem: Discharge Progression Outcomes Goal: Pain controlled with appropriate interventions Outcome: Completed/Met Date Met:  11/23/13

## 2013-11-23 NOTE — Progress Notes (Signed)
Pt c/o dizziness & clots in toilet, also generalized itching. Start methergine series,hydroxizine, &  CBC in am

## 2013-11-23 NOTE — Progress Notes (Signed)
I assisted Lubertha BasqueSara RN with some questions, and also patients have some questions about medications for itch.by Orlan LeavensViria Alvarez, Interpreter

## 2013-11-24 ENCOUNTER — Ambulatory Visit: Payer: Self-pay

## 2013-11-24 NOTE — Lactation Note (Addendum)
This note was copied from the chart of Martha [name]. Lactation Consultation Note Baby on double bili lights for elevated bilirubin. Had 9% weight loss. Mom has DEBP at bedside and is post pumping, breast and bottle feeding. Supplementing w/pergestimil and pumped breast milk. Baby had adequate I&O. Will cont. To monitor. 56 hr. Old. Patient Name: Martha [name] ZOXWR'UToday's Date: 11/24/2013 Reason for consult: Follow-up assessment;Infant weight loss;Hyperbilirubinemia   Maternal Data    Feeding Feeding Type: Breast Fed Length of feed: 10 min  LATCH Score/Interventions                      Lactation Tools Discussed/Used     Consult Status Consult Status: Follow-up Date: 11/24/13 Follow-up type: In-patient    Draydon Clairmont, Diamond NickelLAURA G 11/24/2013, 3:23 AM

## 2013-11-24 NOTE — Lactation Note (Signed)
This note was copied from the chart of Martha [name]. Lactation Consultation Note  Mother stated she pumped 95 ml in 20 minutes.  Baby drank 45 ml at last feeding. Asked mother is she could understand what I was saying and she said yes. Suggest mother pace feed, take breaks when giving the baby a bottle to help with it' s breathing since he is a late preterm. Mother stated she understood.  Congraulated mother on excellent milk supply and pumping. Patient Name: Martha [name] MVHQI'OToday's Date: 11/24/2013     Maternal Data    Feeding Feeding Type: Bottle Fed - Breast Milk Nipple Type: Slow - flow  LATCH Score/Interventions                      Lactation Tools Discussed/Used     Consult Status      Hardie PulleyBerkelhammer, Ruth Boschen 11/24/2013, 6:02 PM

## 2013-11-26 ENCOUNTER — Ambulatory Visit: Payer: Self-pay

## 2013-11-26 NOTE — Lactation Note (Signed)
This note was copied from the chart of Martha [name]. Lactation Consultation Note: Follow up visit with mom. Baby continues under phototherapy. Mom reports that baby is latching well and her mature milk is on. Has several bottles of EBM at bedside. [facility]BP here. [facility]  [facility] but at this time mom does not want loaner- will use Manual pump. Reviewed manual pump- use and cleaning of pump pieces. No questions at present. To call prn.  Patient Name: Martha [name] ZOXWR'UToday's Date: 11/26/2013 Reason for consult: Follow-up assessment   Maternal Data Formula Feeding for Exclusion: No Does the patient have breastfeeding experience prior to this delivery?: Yes  Feeding Feeding Type: Breast Fed Length of feed: 15 min  LATCH Score/Interventions                      Lactation Tools Discussed/Used     Consult Status Consult Status: Complete    Pamelia HoitWeeks, Deidrea Gaetz D 11/26/2013, 8:39 AM

## 2013-11-27 ENCOUNTER — Ambulatory Visit: Payer: Self-pay

## 2013-11-27 NOTE — Lactation Note (Signed)
This note was copied from the chart of [name]. Lactation Consultation Note  Patient Name: [name] ZOXWR'UToday's Date: 11/27/2013 Reason for consult: Follow-up assessment [facility] and $30 cash on behalf of patient by patient's [employer] RN Martha [name]. Took mom DEBP and copies of papers pointing out return date of pump on patient's copy. Discharge teaching already performed. Mom states she has no questions, and declined interpreter.   Maternal Data    Feeding Nipple Type: Slow - flow  LATCH Score/Interventions                      Lactation Tools Discussed/Used     Consult Status Consult Status: Complete    Martha [name], Martha [name] 11/27/2013, 4:22 PM

## 2013-11-27 NOTE — Lactation Note (Addendum)
This note was copied from the chart of Martha [name]. Lactation Consultation Note  Alex interpreter present.  Baby sleeping in crib with phototherapy lights on. Mother states she is only bottle feeding pumped breastmilk and formula. Discussed WIC loaner.  Provided her with paperwork packet for loaner pump. Reviewed engorgement care and suggest she call if she wants assistance latching. Mother denies nipple soreness. [facility]   Patient Name: Martha [name] ZOXWR'UToday's Date: 11/27/2013 Reason for consult: Follow-up assessment   Maternal Data    Feeding    LATCH Score/Interventions                      Lactation Tools Discussed/Used     Consult Status Consult Status: Follow-up Date: 11/28/13 Follow-up type: In-patient    Dahlia ByesBerkelhammer, Youa Deloney [facility] Center[facility]oschen 11/27/2013, 9:07 AM

## 2013-12-20 ENCOUNTER — Encounter: Payer: Self-pay | Admitting: Obstetrics & Gynecology

## 2014-01-02 ENCOUNTER — Ambulatory Visit: Payer: Self-pay | Admitting: Obstetrics & Gynecology

## 2014-02-09 ENCOUNTER — Ambulatory Visit: Payer: Self-pay | Admitting: Obstetrics & Gynecology

## 2014-03-13 ENCOUNTER — Ambulatory Visit (INDEPENDENT_AMBULATORY_CARE_PROVIDER_SITE_OTHER): Payer: Self-pay | Admitting: Obstetrics & Gynecology

## 2014-03-13 ENCOUNTER — Encounter: Payer: Self-pay | Admitting: Obstetrics & Gynecology

## 2014-03-13 LAB — POCT PREGNANCY, URINE: Preg Test, Ur: NEGATIVE

## 2014-03-13 NOTE — Progress Notes (Signed)
Used [employer] Interpreter#[id number] at first and then in house interpreter [employer] [place]. Has had intercourse  Once last week but used condom. Wants  To use depo-provera for birth control

## 2014-03-13 NOTE — Progress Notes (Signed)
Patient ID: Martha [name], female   DOB: 01/27/1985, 29 y.o.   MRN: [id number] Subjective:     Martha [name] is a 29 y.o. female who presents for a postpartum visit. She is 3 months  postpartum following a spontaneous vaginal delivery. I have fully reviewed the prenatal and intrapartum course. The delivery was at term. Outcome: spontaneous vaginal delivery. Anesthesia: none. Postpartum course has been uncomplicated. Baby's course has been unremarkable. Baby is feeding by breast. Bleeding no bleeding. Bowel function is normal. Bladder function is normal. Patient is sexually active. Contraception method is condoms. Postpartum depression screening: negative.  The following portions of the patient's history were reviewed and updated as appropriate: allergies, current medications, past family history, past medical history, past social history, past surgical history and problem list.  Review of Systems A comprehensive review of systems was negative.   Objective:    BP 123/74 mmHg  Pulse 74  Temp(Src) 98.2 F (36.8 C)  Wt 186 lb 14.4 oz (84.777 kg)  Breastfeeding? Yes  deferred       Assessment:     3 months postpartum exam. Pap smear not done at today's visit.   H/o GDM  Plan:    1. Contraception: will go to the HD to complete comtraception 2.  Follow up in: 1 year or as needed.   3. F/u for 2 hour GTT

## 2014-03-13 NOTE — Patient Instructions (Signed)
Diabetes mellitus gestacional (Gestational Diabetes Mellitus) La diabetes mellitus gestacional, ms comnmente conocida como diabetes gestacional es un tipo de diabetes que desarrollan algunas mujeres durante el embarazo. En la diabetes gestacional, el pncreas no produce suficiente insulina (una hormona) o las clulas son menos sensibles a la insulina producida (resistencia a la insulina), o ambas cosas. Normalmente, la insulina mueve los azcares de los alimentos a las clulas de los tejidos. Las clulas de los tejidos utilizan los azcares para obtener energa. La falta de insulina o la falta de una respuesta normal a la insulina hace que el exceso de azcar se acumule en la sangre en lugar de penetrar en las clulas de los tejidos. Como resultado, se producen niveles altos de azcar en la sangre (hiperglucemia). El efecto de los niveles altos de azcar (glucosa) puede causar muchos problemas.  FACTORES DE RIESGO Usted tiene mayor probabilidad de desarrollar diabetes gestacional si tiene antecedentes familiares de diabetes y tambin si tiene uno o ms de los siguientes factores de riesgo:  ndice de masa corporal superior a 30 (obesidad).  Embarazo previo con diabetes gestacional.  La edad avanzada en el momento del embarazo. Si se mantienen los niveles de glucosa en la sangre en un rango normal durante el embarazo, las mujeres pueden tener un embarazo saludable. Si los niveles de glucosa en la sangre no estn bien controlados, puede haber riesgos para usted, el feto o el recin nacido, o durante el trabajo de parto y el parto.  SNTOMAS  Si se presentan sntomas, stos son similares a los sntomas que normalmente experimentar durante el embarazo. Los sntomas de la diabetes gestacional son:   Aumento de la sed (polidipsia).  Aumento de la miccin (poliuria).  Orina con ms frecuencia durante la noche (nocturia).  Prdida de peso. La prdida de peso puede ser muy rpida.  Infecciones  frecuentes y recurrentes.  Cansancio (fatiga).  Debilidad.  Cambios en la visin, como visin borrosa.  Olor a fruta en el aliento.  Dolor abdominal. DIAGNSTICO La diabetes se diagnostica cuando hay aumento de los niveles de glucosa en la sangre. El nivel de glucosa en la sangre puede controlarse en uno o ms de los siguientes anlisis de sangre:  Medicin de glucosa en la sangre en ayunas. No se le permitir comer durante al menos 8 horas antes de que se tome una muestra de sangre.  Pruebas al azar de glucosa en la sangre. El nivel de glucosa en la sangre se controla en cualquier momento del da sin importar el momento en que haya comido.  Prueba de A1c (hemoglobina glucosilada) Una prueba de A1c proporciona informacin sobre el control de la glucosa en la sangre durante los ltimos 3 meses.  Prueba de tolerancia a la glucosa oral (PTGO). La glucosa en la sangre se mide despus de no haber comido (ayunas) durante una a tres horas y despus de beber una bebida que contenga glucosa. Dado que las hormonas que causan la resistencia a la insulina son ms altas alrededor de las semanas 24 a 28 de embarazo, generalmente se realiza una PTGO durante ese tiempo. Si tiene factores de riesgo de diabetes gestacional, su mdico puede hacerle estudios de deteccin antes de las 24semanas de embarazo. TRATAMIENTO   Usted tendr que tomar medicamentos para la diabetes o insulina diariamente para mantener los niveles de glucosa en la sangre en el rango deseado.  Usted tendr que combinar la dosis de insulina con la actividad fsica y la eleccin de alimentos saludables. El objetivo del   tratamiento es mantener el nivel de azcar en la sangre previo a comer (preprandial) y durante la noche entre 60 y 99mg/dl, durante todo el embarazo. El objetivo del tratamiento es mantener el nivel pico de azcar en la sangre despus de comer (glucosa posprandial) entre 100y 140mg/dl. INSTRUCCIONES PARA EL CUIDADO EN EL  HOGAR   Controle su nivel de hemoglobina A1c dos veces al ao.  Contrlese a diario el nivel de glucosa en la sangre segn las indicaciones de su mdico. Es comn realizar controles frecuentes de la glucosa en la sangre.  Supervise las cetonas en la orina cuando est enferma y segn las indicaciones de su mdico.  Tome el medicamento para la diabetes y adminstrese insulina segn las indicaciones de su mdico para mantener el nivel de glucosa en la sangre en el rango deseado.  Nunca se quede sin medicamento para la diabetes o sin insulina. Es necesario que la reciba todos los das.  Ajuste la insulina segn la ingesta de hidratos de carbono. Los hidratos de carbono pueden aumentar los niveles de glucosa en la sangre, pero deben incluirse en su dieta. Los hidratos de carbono aportan vitaminas, minerales y fibra que son una parte esencial de una dieta saludable. Los hidratos de carbono se encuentran en frutas, verduras, cereales integrales, productos lcteos, legumbres y alimentos que contienen azcares aadidos.  Consuma alimentos saludables. Alterne 3 comidas con 3 colaciones.  Aumente de peso saludablemente. El aumento del peso total vara de acuerdo con el ndice de masa corporal que tena antes del embarazo (IMC).  Lleve una tarjeta de alerta mdica o use una pulsera o medalla de alerta mdica.  Lleve con usted una colacin de 15gramos de hidratos de carbono en todo momento para controlar los niveles bajos de glucosa en la sangre (hipoglucemia). Algunos ejemplos de colaciones de 15gramos de hidratos de carbono son los siguientes:  Tabletas de glucosa, 3 o 4.  Gel de glucosa, tubo de 15 gramos.  Pasas de uva, 2 cucharadas (24 g).  Caramelos de goma, 6.  Galletas de animales, 8.  Jugo de fruta, gaseosa comn, o leche descremada, 4 onzas (120 ml).  Pastillas de goma, 9.  Reconocer la hipoglucemia. Durante el embarazo la hipoglucemia se produce cuando hay niveles de glucosa en la  sangre de 60 mg/dl o menos. El riesgo de hipoglucemia aumenta durante el ayuno o cuando se saltea las comidas, durante o despus de realizar ejercicio intenso y mientras duerme. Los sntomas de hipoglucemia son:  Temblores o sacudidas.  Disminucin de la capacidad de concentracin.  Sudoracin.  Aumento de la frecuencia cardaca.  Dolor de cabeza.  Sequedad en la boca.  Hambre.  Irritabilidad.  Ansiedad.  Sueo agitado.  Alteracin del habla o de la coordinacin.  Confusin.  Tratar la hipoglucemia rpidamente. Si usted est alerta y puede tragar con seguridad, siga la regla de 15/15 que consiste en:  Tome entre 15 y 20gramos de glucosa de accin rpida o carbohidratos. Las opciones de accin rpida son un gel de glucosa, tabletas de glucosa, o 4 onzas (120 ml) de jugo de frutas, gaseosa comn, o leche baja en grasa.  Compruebe su nivel de glucosa en la sangre 15 minutos despus de tomar la glucosa.  Tome entre 15 y 20 gramos ms de glucosa si el nivel de glucosa en la sangre todava es de 70mg/dl o inferior.  Ingiera una comida o una colacin en el lapso de 1 hora una vez que los niveles de glucosa en la sangre vuelven   a la normalidad.  Est atento a la poliuria (miccin excesiva) y la polidipsia (sensacin de mucha sed), que son los primeros signos de la hiperglucemia. El reconocimiento temprano de la hiperglucemia permite un tratamiento oportuno. Trate la hiperglucemia segn le indic su mdico.  Haga actividad fsica por lo menos 30minutos al da o como lo indique su mdico. Se recomienda que 30 minutos despus de cada comida, realice diez minutos de actividad fsica para controlar los niveles de glucosa postprandial en la sangre.  Ajuste su dosis de insulina y la ingesta de alimentos, segn sea necesario, si inicia un nuevo ejercicio o deporte.  Siga su plan para los das de enfermedad cuando no pueda comer o beber como de costumbre.  Evite el tabaco y el  alcohol.  Concurra a todas las visitas de control como se lo haya indicado el mdico.  Siga el consejo del mdico respecto a los controles prenatales y posteriores al parto (postparto), las visitas, la planificacin de las comidas, el ejercicio, los medicamentos, las vitaminas, los anlisis de sangre, otras pruebas mdicas y actividades fsicas.  Realice diariamente el cuidado de la piel y de los pies. Examine su piel y los pies diariamente para ver si tiene cortes, moretones, enrojecimiento, problemas en las uas, sangrado, ampollas o llagas.  Cepllese los dientes y encas por lo menos dos veces al da y use hilo dental al menos una vez por da. Concurra regularmente a las visitas de control con el dentista.  Programe un examen de vista durante el primer trimestre de su embarazo o como lo indique su mdico.  Comparta su plan de control de diabetes en el trabajo o en la escuela.  Mantngase al da con las vacunas.  Aprenda a manejar el estrs.  Obtenga la mayor cantidad posible de informacin sobre la diabetes y solicite ayuda siempre que sea necesario.  Obtenga informacin sobre el amamantamiento y analice esta posibilidad.  Debe controlar el nivel de azcar en la sangre de 6a 12semanas despus del parto. Esto se hace con una prueba de tolerancia a la glucosa oral (PTGO). SOLICITE ATENCIN MDICA SI:   No puede comer alimentos o beber por ms de 6 horas.  Tuvo nuseas o ha vomitado durante ms de 6 horas.  Tiene un nivel de glucosa en la sangre de 200 mg/dl y cetonas en la orina.  Presenta algn cambio en el estado mental.  Desarrolla problemas de visin.  Sufre un dolor persistente de cabeza.  Siente dolor o molestias en la parte superior del abdomen.  Desarrolla una enfermedad grave adicional.  Tuvo diarrea durante ms de 6 horas.  Ha estado enfermo o ha tenido fiebre durante un par de das y no mejora. SOLICITE ATENCIN MDICA DE INMEDIATO SI:   Tiene dificultad  para respirar.  Ya no siente los movimientos del beb.  Est sangrando o tiene flujo vaginal.  Comienza a tener contracciones o trabajo de parto prematuro. ASEGRESE DE QUE:  Comprende estas instrucciones.  Controlar su afeccin.  Recibir ayuda de inmediato si no mejora o si empeora. Document Released: 10/16/2004 Document Revised: 05/23/2013 ExitCare Patient Information 2015 ExitCare, LLC. This information is not intended to replace advice given to you by your health care provider. Make sure you discuss any questions you have with your health care provider.  

## 2014-03-14 ENCOUNTER — Other Ambulatory Visit: Payer: Self-pay

## 2014-03-23 ENCOUNTER — Encounter: Payer: Self-pay | Admitting: Obstetrics & Gynecology

## 2015-01-24 ENCOUNTER — Emergency Department (HOSPITAL_COMMUNITY): Payer: Medicaid Other

## 2015-01-24 ENCOUNTER — Inpatient Hospital Stay (HOSPITAL_COMMUNITY)
Admission: EM | Admit: 2015-01-24 | Discharge: 2015-01-27 | DRG: 872 | Disposition: A | Discharge status: Home / Self Care | Payer: Medicaid Other | Attending: Internal Medicine | Admitting: Internal Medicine

## 2015-01-24 ENCOUNTER — Encounter (HOSPITAL_COMMUNITY): Payer: Self-pay | Admitting: *Deleted

## 2015-01-24 DIAGNOSIS — Z833 Family history of diabetes mellitus: Secondary | ICD-10-CM

## 2015-01-24 DIAGNOSIS — N200 Calculus of kidney: Secondary | ICD-10-CM | POA: Diagnosis present

## 2015-01-24 DIAGNOSIS — N12 Tubulo-interstitial nephritis, not specified as acute or chronic: Secondary | ICD-10-CM | POA: Diagnosis present

## 2015-01-24 DIAGNOSIS — A4150 Gram-negative sepsis, unspecified: Principal | ICD-10-CM | POA: Diagnosis present

## 2015-01-24 DIAGNOSIS — Z810 Family history of intellectual disabilities: Secondary | ICD-10-CM

## 2015-01-24 DIAGNOSIS — Z8249 Family history of ischemic heart disease and other diseases of the circulatory system: Secondary | ICD-10-CM | POA: Diagnosis not present

## 2015-01-24 DIAGNOSIS — A419 Sepsis, unspecified organism: Secondary | ICD-10-CM | POA: Diagnosis present

## 2015-01-24 DIAGNOSIS — B9620 Unspecified Escherichia coli [E. coli] as the cause of diseases classified elsewhere: Secondary | ICD-10-CM | POA: Diagnosis present

## 2015-01-24 HISTORY — DX: Renal tubulo-interstitial disease, unspecified: N15.9

## 2015-01-24 LAB — URINALYSIS, ROUTINE W REFLEX MICROSCOPIC
Bilirubin Urine: NEGATIVE
GLUCOSE, UA: 100 mg/dL — AB
Ketones, ur: NEGATIVE mg/dL
Nitrite: POSITIVE — AB
Protein, ur: 100 mg/dL — AB
SPECIFIC GRAVITY, URINE: 1.025 (ref 1.005–1.030)
pH: 6 (ref 5.0–8.0)

## 2015-01-24 LAB — COMPREHENSIVE METABOLIC PANEL
ALBUMIN: 3.4 g/dL — AB (ref 3.5–5.0)
ALK PHOS: 86 U/L (ref 38–126)
ALT: 34 U/L (ref 14–54)
AST: 31 U/L (ref 15–41)
Anion gap: 10 (ref 5–15)
BUN: 11 mg/dL (ref 6–20)
CALCIUM: 8.9 mg/dL (ref 8.9–10.3)
CO2: 22 mmol/L (ref 22–32)
CREATININE: 0.73 mg/dL (ref 0.44–1.00)
Chloride: 104 mmol/L (ref 101–111)
GFR calc Af Amer: >60 mL/min (ref >60)
GFR calc non Af Amer: >60 mL/min (ref >60)
GLUCOSE: 196 mg/dL — AB (ref 65–99)
Potassium: 3.8 mmol/L (ref 3.5–5.1)
SODIUM: 136 mmol/L (ref 135–145)
Total Bilirubin: 0.6 mg/dL (ref 0.3–1.2)
Total Protein: 7.1 g/dL (ref 6.5–8.1)

## 2015-01-24 LAB — CBC WITH DIFFERENTIAL/PLATELET
Basophils Absolute: 0 10*3/uL (ref 0.0–0.1)
Basophils Relative: 0 %
EOS ABS: 0 10*3/uL (ref 0.0–0.7)
EOS PCT: 0 %
HCT: 37.4 % (ref 36.0–46.0)
HEMOGLOBIN: 12.7 g/dL (ref 12.0–15.0)
LYMPHS ABS: 1 10*3/uL (ref 0.7–4.0)
Lymphocytes Relative: 6 %
MCH: 30.5 pg (ref 26.0–34.0)
MCHC: 34 g/dL (ref 30.0–36.0)
MCV: 89.9 fL (ref 78.0–100.0)
Monocytes Absolute: 1.8 10*3/uL — ABNORMAL HIGH (ref 0.1–1.0)
Monocytes Relative: 11 %
NEUTROS PCT: 83 %
Neutro Abs: 14.1 10*3/uL — ABNORMAL HIGH (ref 1.7–7.7)
Platelets: 336 10*3/uL (ref 150–400)
RBC: 4.16 MIL/uL (ref 3.87–5.11)
RDW: 12.7 % (ref 11.5–15.5)
WBC: 17 10*3/uL — ABNORMAL HIGH (ref 4.0–10.5)

## 2015-01-24 LAB — URINE MICROSCOPIC-ADD ON

## 2015-01-24 LAB — I-STAT CG4 LACTIC ACID, ED
Lactic Acid, Venous: 1.7 mmol/L (ref 0.5–2.0)
Lactic Acid, Venous: 0.86 mmol/L (ref 0.5–2.0)

## 2015-01-24 LAB — POC URINE PREG, ED: PREG TEST UR: NEGATIVE

## 2015-01-24 MED ORDER — ONDANSETRON HCL 4 MG/2ML IJ SOLN
4.0000 mg | Freq: Once | INTRAMUSCULAR | Status: AC
  Administered 2015-01-24: 4 mg via INTRAVENOUS
  Filled 2015-01-24: qty 2

## 2015-01-24 MED ORDER — DEXTROSE 5 % IV SOLN
1.0000 g | Freq: Once | INTRAVENOUS | Status: AC
  Administered 2015-01-24: 1 g via INTRAVENOUS
  Filled 2015-01-24: qty 10

## 2015-01-24 MED ORDER — SODIUM CHLORIDE 0.9 % IV BOLUS (SEPSIS)
1000.0000 mL | Freq: Once | INTRAVENOUS | Status: AC
  Administered 2015-01-24: 1000 mL via INTRAVENOUS

## 2015-01-24 MED ORDER — ACETAMINOPHEN 325 MG PO TABS
650.0000 mg | ORAL_TABLET | Freq: Once | ORAL | Status: AC | PRN
  Administered 2015-01-24: 650 mg via ORAL

## 2015-01-24 MED ORDER — ACETAMINOPHEN 325 MG PO TABS
ORAL_TABLET | ORAL | Status: AC
  Filled 2015-01-24: qty 2

## 2015-01-24 MED ORDER — IOHEXOL 300 MG/ML  SOLN
100.0000 mL | Freq: Once | INTRAMUSCULAR | Status: AC | PRN
  Administered 2015-01-24: 100 mL via INTRAVENOUS

## 2015-01-24 MED ORDER — MORPHINE SULFATE (PF) 4 MG/ML IV SOLN
4.0000 mg | Freq: Once | INTRAVENOUS | Status: AC
Start: 2015-01-24 — End: 2015-01-24
  Administered 2015-01-24: 4 mg via INTRAVENOUS
  Filled 2015-01-24: qty 1

## 2015-01-24 MED ORDER — SODIUM CHLORIDE 0.9 % IV BOLUS (SEPSIS)
2000.0000 mL | Freq: Once | INTRAVENOUS | Status: AC
  Administered 2015-01-24: 2000 mL via INTRAVENOUS

## 2015-01-24 MED ORDER — FENTANYL CITRATE (PF) 100 MCG/2ML IJ SOLN
50.0000 ug | Freq: Once | INTRAMUSCULAR | Status: AC
  Administered 2015-01-24: 50 ug via INTRAVENOUS
  Filled 2015-01-24: qty 2

## 2015-01-24 NOTE — ED Provider Notes (Signed)
CSN: [id number]     Arrival date & time 01/24/15  1532 History   First MD Initiated Contact with Patient 01/24/15 1717     Chief Complaint  Patient presents with  . Fever  . Abdominal Pain     (Consider location/radiation/quality/duration/timing/severity/associated sxs/prior Treatment) HPI Patient presents with 4 days of right flank and right-sided abdominal pain. She's had dysuria with occasional gross hematuria. She also admits to fever and chills. She's had nausea and vomiting with occasional loose stool. No known sick contacts. No vaginal bleeding or discharge. Previous appendectomy and cholecystectomy. Past Medical History  Diagnosis Date  . Gestational diabetes     insulin dependent   Past Surgical History  Procedure Laterality Date  . Cholecystectomy    . Appendectomy     Family History  Problem Relation Age of Onset  . Diabetes Mother   . Heart disease Father   . Mental retardation Sister    Social History  Substance Use Topics  . Smoking status: Never Smoker   . Smokeless tobacco: Never Used  . Alcohol Use: No   OB History    Gravida Para Term Preterm AB TAB SAB Ectopic Multiple Living   4 4 3 1      4      Review of Systems  Constitutional: Positive for fever, chills and fatigue.  HENT: Negative for sore throat.   Respiratory: Negative for cough and shortness of breath.   Cardiovascular: Negative for chest pain.  Gastrointestinal: Positive for nausea, vomiting, abdominal pain and diarrhea.  Genitourinary: Positive for dysuria and flank pain. Negative for vaginal bleeding, vaginal discharge and pelvic pain.  Musculoskeletal: Positive for myalgias and back pain. Negative for neck pain and neck stiffness.  Skin: Negative for rash and wound.  Neurological: Negative for dizziness, weakness, light-headedness, numbness and headaches.  All other systems reviewed and are negative.     Allergies  Influenza vaccines  Home Medications   Prior to Admission  medications   Medication Sig Start Date End Date Taking? Authorizing Provider  acetaminophen (TYLENOL) 325 MG tablet Take 325 mg by mouth every 6 (six) hours as needed for moderate pain.    Yes Historical Provider, MD  Flaxseed, Linseed, (FLAX SEED OIL PO) Take 2 capsules by mouth daily.   Yes Historical Provider, MD  hydrOXYzine (ATARAX/VISTARIL) 25 MG tablet Take 1 tablet (25 mg total) by mouth every 6 (six) hours as needed for itching. Patient not taking: Reported on 03/13/2014 11/07/13   Tereso NewcomerUgonna A Anyanwu, MD  oxyCODONE-acetaminophen (PERCOCET/ROXICET) 5-325 MG per tablet Take 1 tablet by mouth every 4 (four) hours as needed (for pain scale less than 7). 11/23/13   Gwen PoundsKari Koch, MD  prenatal vitamin w/FE, FA (NATACHEW) 29-1 MG CHEW chewable tablet Chew 1 tablet by mouth 3 (three) times daily.     Historical Provider, MD   BP 91/79 mmHg  Pulse 113  Temp(Src) 98.6 F (37 C) (Oral)  Resp 24  Ht 5' 2.21" (1.58 m)  Wt 183 lb 9.6 oz (83.28 kg)  BMI 33.36 kg/m2  SpO2 99%  LMP 01/13/2015 Physical Exam  Constitutional: She is oriented to person, place, and time. She appears well-developed and well-nourished. No distress.  Patient is very well-appearing  HENT:  Head: Normocephalic and atraumatic.  Mouth/Throat: Oropharynx is clear and moist. No oropharyngeal exudate.  Eyes: EOM are normal. Pupils are equal, round, and reactive to light.  Neck: Normal range of motion. Neck supple.  No meningismus  Cardiovascular: Regular rhythm.  Exam  reveals no gallop and no friction rub.   No murmur heard. Tachycardia  Pulmonary/Chest: Effort normal and breath sounds normal. No respiratory distress. She has no wheezes. She has no rales. She exhibits no tenderness.  Abdominal: Soft. Bowel sounds are normal. She exhibits no distension and no mass. There is tenderness (mild diffuse right-sided upper and lower quadrants tenderness palpation. No definite rebound or guarding.). There is no guarding.  Musculoskeletal:  Normal range of motion. She exhibits tenderness. She exhibits no edema.  Right CVA tenderness with percussion. Patient has no lower extremity swelling or pain. Distal pulses intact  Neurological: She is alert and oriented to person, place, and time.  Moving all extremities without deficit. Sensation is fully intact.  Skin: Skin is warm and dry. No rash noted. No erythema.  Psychiatric: She has a normal mood and affect. Her behavior is normal.  Nursing note and vitals reviewed.   ED Course  Procedures (including critical care time) Labs Review Labs Reviewed  COMPREHENSIVE METABOLIC PANEL - Abnormal; Notable for the following:    Glucose, Bld 196 (*)    Albumin 3.4 (*)    All other components within normal limits  CBC WITH DIFFERENTIAL/PLATELET - Abnormal; Notable for the following:    WBC 17.0 (*)    Neutro Abs 14.1 (*)    Monocytes Absolute 1.8 (*)    All other components within normal limits  URINALYSIS, ROUTINE W REFLEX MICROSCOPIC (NOT AT [facility]) - Abnormal; Notable for the following:    Glucose, UA 100 (*)    Hgb urine dipstick LARGE (*)    Protein, ur 100 (*)    Nitrite POSITIVE (*)    Leukocytes, UA SMALL (*)    All other components within normal limits  URINE MICROSCOPIC-ADD ON - Abnormal; Notable for the following:    Squamous Epithelial / LPF 0-5 (*)    Bacteria, UA MANY (*)    All other components within normal limits  CULTURE, BLOOD (ROUTINE X 2)  CULTURE, BLOOD (ROUTINE X 2)  URINE CULTURE  I-STAT CG4 LACTIC ACID, ED  POC URINE PREG, ED  I-STAT CG4 LACTIC ACID, ED    Imaging Review Dg Chest 2 View  01/24/2015  CLINICAL DATA:  RIGHT lower quadrant pain and fever EXAM: CHEST  2 VIEW COMPARISON:  None. FINDINGS: Normal mediastinum and cardiac silhouette. Normal pulmonary vasculature. No evidence of effusion, infiltrate, or pneumothorax. No acute bony abnormality. Post cholecystectomy. IMPRESSION: Normal chest radiograph Electronically Signed   By: Genevive Bi  M.D.   On: 01/24/2015 17:32   Ct Abdomen Pelvis W Contrast  01/24/2015  CLINICAL DATA:  Abdominal pain for 3 days EXAM: CT ABDOMEN AND PELVIS WITH CONTRAST TECHNIQUE: Multidetector CT imaging of the abdomen and pelvis was performed using the standard protocol following bolus administration of intravenous contrast. CONTRAST:  OMNIPAQUE IOHEXOL 300 MG/ML  SOLN COMPARISON:  None. FINDINGS: Lung bases are well aerated with very minimal atelectatic changes. No focal confluent infiltrate is seen. The gallbladder has been surgically removed. The liver, spleen, adrenal glands and pancreas are within normal limits. The left kidney demonstrates a normal enhancement pattern. No renal calculi or obstructive changes are seen. The right kidney demonstrates enhancement although some patchy decreased enhancement is noted anteriorly and the kidney is generally enlarged suggestive of pyelonephritis. Surrounding perinephric inflammatory changes are noted as well as a small perinephric fluid collection inferiorly. A nonobstructing stone is noted in the midportion of the collecting system. No definitive ureteral stone is noted. Bladder  is well distended. No intraluminal filling defect is noted. No pelvic mass lesion or free pelvic fluid is seen. The osseous structures show no acute abnormality. IMPRESSION: Changes consistent with pyelonephritis in the right kidney. A nonobstructing right renal stone is noted as well. Electronically Signed   By: Alcide Clever M.D.   On: 01/24/2015 20:18   I have personally reviewed and evaluated these images and lab results as part of my medical decision-making.   EKG Interpretation None      MDM   Final diagnoses:  Pyelonephritis    Patient presents with right flank pain and dysuria. Tachycardic and febrile in triage. Normal lactic acid. Suspect pyelonephritis. We'll start antibiotics, IV fluids and get CT abdomen and pelvis.  Patient continues to be well-appearing.Patient has a  normal lactic acid level. Ambulating in the emergency department without difficulty. Improved tachycardia. Blood pressure is borderline with systolics in the 90s. Given IV Rocephin and 4 L of IV fluids in the emergency department. Discuss with [facility]. Will admit to step down bed.   Loren Racer, MD 01/24/15 2201

## 2015-01-24 NOTE — ED Notes (Signed)
Called pt to be triaged 2X, no response 

## 2015-01-24 NOTE — ED Notes (Signed)
Pt c/o abd pain (belly button down), Nausea, vomiting and fever since Sunday. States she has taken "Flanex." c/o bleeding and burning when urinating. Pt speaks spanish and a spanish interpreter was used.

## 2015-01-25 ENCOUNTER — Encounter (HOSPITAL_COMMUNITY): Payer: Self-pay | Admitting: Internal Medicine

## 2015-01-25 DIAGNOSIS — A419 Sepsis, unspecified organism: Secondary | ICD-10-CM | POA: Diagnosis present

## 2015-01-25 DIAGNOSIS — N12 Tubulo-interstitial nephritis, not specified as acute or chronic: Secondary | ICD-10-CM

## 2015-01-25 LAB — CBC WITH DIFFERENTIAL/PLATELET
BASOS ABS: 0 10*3/uL (ref 0.0–0.1)
BASOS PCT: 0 %
EOS PCT: 1 %
Eosinophils Absolute: 0.1 10*3/uL (ref 0.0–0.7)
HCT: 27.1 % — ABNORMAL LOW (ref 36.0–46.0)
Hemoglobin: 9.2 g/dL — ABNORMAL LOW (ref 12.0–15.0)
LYMPHS PCT: 6 %
Lymphs Abs: 0.8 10*3/uL (ref 0.7–4.0)
MCH: 30.8 pg (ref 26.0–34.0)
MCHC: 33.9 g/dL (ref 30.0–36.0)
MCV: 90.6 fL (ref 78.0–100.0)
MONO ABS: 0.6 10*3/uL (ref 0.1–1.0)
Monocytes Relative: 5 %
NEUTROS ABS: 10.7 10*3/uL — AB (ref 1.7–7.7)
Neutrophils Relative %: 88 %
PLATELETS: 213 10*3/uL (ref 150–400)
RBC: 2.99 MIL/uL — AB (ref 3.87–5.11)
RDW: 13 % (ref 11.5–15.5)
WBC: 12.1 10*3/uL — AB (ref 4.0–10.5)

## 2015-01-25 LAB — COMPREHENSIVE METABOLIC PANEL
ALBUMIN: 1.8 g/dL — AB (ref 3.5–5.0)
ALT: 26 U/L (ref 14–54)
AST: 22 U/L (ref 15–41)
Alkaline Phosphatase: 63 U/L (ref 38–126)
Anion gap: 4 — ABNORMAL LOW (ref 5–15)
CHLORIDE: 116 mmol/L — AB (ref 101–111)
CO2: 19 mmol/L — AB (ref 22–32)
CREATININE: 0.42 mg/dL — AB (ref 0.44–1.00)
Calcium: 6.6 mg/dL — ABNORMAL LOW (ref 8.9–10.3)
GFR calc Af Amer: >60 mL/min (ref >60)
GLUCOSE: 178 mg/dL — AB (ref 65–99)
POTASSIUM: 3 mmol/L — AB (ref 3.5–5.1)
Sodium: 139 mmol/L (ref 135–145)
Total Bilirubin: 0.5 mg/dL (ref 0.3–1.2)
Total Protein: 4.2 g/dL — ABNORMAL LOW (ref 6.5–8.1)

## 2015-01-25 LAB — LACTIC ACID, PLASMA: LACTIC ACID, VENOUS: 1 mmol/L (ref 0.5–2.0)

## 2015-01-25 LAB — MRSA PCR SCREENING: MRSA BY PCR: NEGATIVE

## 2015-01-25 LAB — MAGNESIUM: MAGNESIUM: 1.3 mg/dL — AB (ref 1.7–2.4)

## 2015-01-25 LAB — PROTIME-INR
INR: 1.38 (ref 0.00–1.49)
Prothrombin Time: 17.1 seconds — ABNORMAL HIGH (ref 11.6–15.2)

## 2015-01-25 LAB — PROCALCITONIN: Procalcitonin: 2.57 ng/mL

## 2015-01-25 LAB — APTT: APTT: 42 s — AB (ref 24–37)

## 2015-01-25 MED ORDER — ONDANSETRON HCL 4 MG/2ML IJ SOLN: 4.0000 mg | Freq: Four times a day (QID) | INTRAMUSCULAR | Status: DC | PRN

## 2015-01-25 MED ORDER — INFLUENZA VAC SPLIT QUAD 0.5 ML IM SUSY: 0.5000 mL | PREFILLED_SYRINGE | INTRAMUSCULAR | Status: DC

## 2015-01-25 MED ORDER — ENOXAPARIN SODIUM 40 MG/0.4ML ~~LOC~~ SOLN
40.0000 mg | SUBCUTANEOUS | Status: DC
  Administered 2015-01-25: 40 mg via SUBCUTANEOUS
  Filled 2015-01-25 (×2): qty 0.4

## 2015-01-25 MED ORDER — SODIUM CHLORIDE 0.9 % IV SOLN
INTRAVENOUS | Status: AC
  Administered 2015-01-25 (×4): via INTRAVENOUS

## 2015-01-25 MED ORDER — ACETAMINOPHEN 325 MG PO TABS
650.0000 mg | ORAL_TABLET | Freq: Four times a day (QID) | ORAL | Status: DC | PRN
  Administered 2015-01-25 – 2015-01-26 (×3): 650 mg via ORAL
  Filled 2015-01-25 (×3): qty 2

## 2015-01-25 MED ORDER — ACETAMINOPHEN 650 MG RE SUPP: 650.0000 mg | Freq: Four times a day (QID) | RECTAL | Status: DC | PRN

## 2015-01-25 MED ORDER — MORPHINE SULFATE (PF) 2 MG/ML IV SOLN
1.0000 mg | INTRAVENOUS | Status: DC | PRN
  Administered 2015-01-25 (×3): 1 mg via INTRAVENOUS
  Filled 2015-01-25 (×3): qty 1

## 2015-01-25 MED ORDER — CEFTRIAXONE SODIUM 2 G IJ SOLR
2.0000 g | INTRAMUSCULAR | Status: DC
  Administered 2015-01-25: 2 g via INTRAVENOUS
  Filled 2015-01-25 (×2): qty 2

## 2015-01-25 MED ORDER — POTASSIUM CHLORIDE CRYS ER 20 MEQ PO TBCR
40.0000 meq | EXTENDED_RELEASE_TABLET | ORAL | Status: AC
  Administered 2015-01-25 (×2): 40 meq via ORAL
  Filled 2015-01-25 (×2): qty 2

## 2015-01-25 MED ORDER — ONDANSETRON HCL 4 MG PO TABS
4.0000 mg | ORAL_TABLET | Freq: Four times a day (QID) | ORAL | Status: DC | PRN
  Administered 2015-01-26: 4 mg via ORAL
  Filled 2015-01-25: qty 1

## 2015-01-25 NOTE — Care Management Note (Addendum)
Case Management Note  Patient Details  Name: Martha [name] Soc MRN: [id number] Date of Birth: 12/30/1985  Subjective/Objective:   Adm w sepsis                 Action/Plan: lives alone  Expected Discharge Date:                  Expected Discharge Plan:     In-House Referral:     Discharge planning Services     Post Acute Care Choice:    Choice offered to:     DME Arranged:    DME Agency:     HH Arranged:    HH Agency:     Status of Service:     Medicare Important Message Given:    Date Medicare IM Given:    Medicare IM give by:    Date Additional Medicare IM Given:    Additional Medicare Important Message give by:     If discussed at Long Length of Stay Meetings, dates discussed:    Additional Comments: ur review done, left pt inform on [facility].   Hanley Haysowell, [name], RN 01/25/2015, 8:57 AM

## 2015-01-25 NOTE — Progress Notes (Signed)
Admitted after midnight. Chart reviewed. Patient interviewed via interpreter phone, examined. Lactates ok. Continue IVF, ceftriaxone, SDU.  Martha Curborinna Cornellius Kropp, MD [facility]

## 2015-01-25 NOTE — H&P (Signed)
[facility] History and Physical  Martha [name] Soc DGU:[id number] DOB: 05/24/1985 DOA: 01/24/2015  Referring physician: ER physician. PCP: No PCP Per Patient  Specialists: None.  Chief Complaint: Abdominal pain.  HPI: Martha [name] Soc is a 30 y.o. female with no significant past medical history presents to the ER because of abdominal pain. Patient has been having these symptoms for last 2-3 days. Pain is mostly in the right flank area radiating to the groin. Has been having some nausea and dysuria. In the ER patient was found to be tachycardic and febrile. CT of the abdomen shows features consistent with right-sided pyelonephritis. There is also a nonobstructing stone. Patient has been admitted for sepsis secondary to pyelonephritis.   Review of Systems: As presented in the history of presenting illness, rest negative.  Past Medical History  Diagnosis Date  . Gestational diabetes     insulin dependent   Past Surgical History  Procedure Laterality Date  . Cholecystectomy    . Appendectomy     Social History:  reports that she has never smoked. She has never used smokeless tobacco. She reports that she does not drink alcohol or use illicit drugs. Where does patient live home. Can patient participate in ADLs? Yes.  Allergies  Allergen Reactions  . Influenza Vaccines Itching and Rash    Family History:  Family History  Problem Relation Age of Onset  . Diabetes Mother   . Heart disease Father   . Mental retardation Sister       Prior to Admission medications   Medication Sig Start Date End Date Taking? Authorizing Provider  acetaminophen (TYLENOL) 325 MG tablet Take 325 mg by mouth every 6 (six) hours as needed for moderate pain.    Yes Historical Provider, MD  Flaxseed, Linseed, (FLAX SEED OIL PO) Take 2 capsules by mouth daily.   Yes Historical Provider, MD  hydrOXYzine (ATARAX/VISTARIL) 25 MG tablet Take 1 tablet (25 mg total) by mouth every 6 (six) hours as needed  for itching. Patient not taking: Reported on 03/13/2014 11/07/13   Tereso NewcomerUgonna A Anyanwu, MD  oxyCODONE-acetaminophen (PERCOCET/ROXICET) 5-325 MG per tablet Take 1 tablet by mouth every 4 (four) hours as needed (for pain scale less than 7). 11/23/13   Gwen PoundsKari Koch, MD  prenatal vitamin w/FE, FA (NATACHEW) 29-1 MG CHEW chewable tablet Chew 1 tablet by mouth 3 (three) times daily.     Historical Provider, MD    Physical Exam: Filed Vitals:   01/24/15 2130 01/24/15 2332 01/25/15 0030 01/25/15 0034  BP: 91/79 94/61 136/123 104/57  Pulse: 113 136 131 132  Temp:      TempSrc:      Resp: 24 19 13  40  Height:      Weight:      SpO2: 99% 94% 94% 95%     General:  Moderately built and nourished.  Eyes: Anicteric no pallor.  ENT: No discharge from the ears eyes nose or mouth.  Neck: No mass felt.  Cardiovascular: S1 and S2 heard.  Respiratory: No rhonchi or crepitations.  Abdomen: Soft nontender bowel sounds present.  Skin: No rash.  Musculoskeletal: No edema.  Psychiatric: Appears normal.  Neurologic: Alert awake oriented to time place and person. Moves all extremities.  Labs on Admission:  Basic Metabolic Panel:  Recent Labs Lab 01/24/15 1645  NA 136  K 3.8  CL 104  CO2 22  GLUCOSE 196*  BUN 11  CREATININE 0.73  CALCIUM 8.9   Liver Function Tests:  Recent Labs Lab 01/24/15 1645  AST 31  ALT 34  ALKPHOS 86  BILITOT 0.6  PROT 7.1  ALBUMIN 3.4*   No results for input(s): LIPASE, AMYLASE in the last 168 hours. No results for input(s): AMMONIA in the last 168 hours. CBC:  Recent Labs Lab 01/24/15 1645  WBC 17.0*  NEUTROABS 14.1*  HGB 12.7  HCT 37.4  MCV 89.9  PLT 336   Cardiac Enzymes: No results for input(s): CKTOTAL, CKMB, CKMBINDEX, TROPONINI in the last 168 hours.  BNP (last 3 results) No results for input(s): BNP in the last 8760 hours.  ProBNP (last 3 results) No results for input(s): PROBNP in the last 8760 hours.  CBG: No results for  input(s): GLUCAP in the last 168 hours.  Radiological Exams on Admission: Dg Chest 2 View  01/24/2015  CLINICAL DATA:  RIGHT lower quadrant pain and fever EXAM: CHEST  2 VIEW COMPARISON:  None. FINDINGS: Normal mediastinum and cardiac silhouette. Normal pulmonary vasculature. No evidence of effusion, infiltrate, or pneumothorax. No acute bony abnormality. Post cholecystectomy. IMPRESSION: Normal chest radiograph Electronically Signed   By: Genevive Bi M.D.   On: 01/24/2015 17:32   Ct Abdomen Pelvis W Contrast  01/24/2015  CLINICAL DATA:  Abdominal pain for 3 days EXAM: CT ABDOMEN AND PELVIS WITH CONTRAST TECHNIQUE: Multidetector CT imaging of the abdomen and pelvis was performed using the standard protocol following bolus administration of intravenous contrast. CONTRAST:  OMNIPAQUE IOHEXOL 300 MG/ML  SOLN COMPARISON:  None. FINDINGS: Lung bases are well aerated with very minimal atelectatic changes. No focal confluent infiltrate is seen. The gallbladder has been surgically removed. The liver, spleen, adrenal glands and pancreas are within normal limits. The left kidney demonstrates a normal enhancement pattern. No renal calculi or obstructive changes are seen. The right kidney demonstrates enhancement although some patchy decreased enhancement is noted anteriorly and the kidney is generally enlarged suggestive of pyelonephritis. Surrounding perinephric inflammatory changes are noted as well as a small perinephric fluid collection inferiorly. A nonobstructing stone is noted in the midportion of the collecting system. No definitive ureteral stone is noted. Bladder is well distended. No intraluminal filling defect is noted. No pelvic mass lesion or free pelvic fluid is seen. The osseous structures show no acute abnormality. IMPRESSION: Changes consistent with pyelonephritis in the right kidney. A nonobstructing right renal stone is noted as well. Electronically Signed   By: Alcide Clever M.D.   On:  01/24/2015 20:18    EKG: Independently reviewed. Sinus tachycardia.  Assessment/Plan Principal Problem:   Sepsis (HCC) Active Problems:   Pyelonephritis   1. Sepsis from pyelonephritis - patient has been placed on ceftriaxone. Continue with aggressive hydration. Follow blood cultures and urine cultures. Check pro-calcitonin levels and follow lactic acid levels. 2. Sinus tachycardia from sepsis. 3. Renal stone - nonobstructing.   DVT Prophylaxis Lovenox.  Code Status: Full code.  Family Communication: Discussed with family.  Disposition Plan: Admit to inpatient.    Goldye Tourangeau N. [facility] Pager [phone].  If 7PM-7AM, please contact night-coverage [email or web address] Password [email or web address] 01/25/2015, 1:12 AM

## 2015-01-25 NOTE — Progress Notes (Signed)
ANTIBIOTIC CONSULT NOTE - INITIAL  Pharmacy Consult for Ceftriaxone  Indication: Pyelonephritis  Allergies  Allergen Reactions  . Influenza Vaccines Itching and Rash    Patient Measurements: Height: 5' (152.4 cm) Weight: 194 lb 0.1 oz (88 kg) IBW/kg (Calculated) : 45.5  Vital Signs: Temp: 99.4 F (37.4 C) (01/05 0100) Temp Source: Oral (01/05 0100) BP: 91/58 mmHg (01/05 0100) Pulse Rate: 145 (01/05 0100) Intake/Output from previous day: 01/04 0701 - 01/05 0700 In: 5490 [P.O.:240; I.V.:5200; IV Piggyback:50] Out: -  Intake/Output from this shift: Total I/O In: 5490 [P.O.:240; I.V.:5200; IV Piggyback:50] Out: -   Labs:  Recent Labs  01/24/15 1645  WBC 17.0*  HGB 12.7  PLT 336  CREATININE 0.73   Estimated Creatinine Clearance: 101.5 mL/min (by C-G formula based on Cr of 0.73). No results for input(s): VANCOTROUGH, VANCOPEAK, VANCORANDOM, GENTTROUGH, GENTPEAK, GENTRANDOM, TOBRATROUGH, TOBRAPEAK, TOBRARND, AMIKACINPEAK, AMIKACINTROU, AMIKACIN in the last 72 hours.   Microbiology: No results found for this or any previous visit (from the past 720 hour(s)).  Medical History: Past Medical History  Diagnosis Date  . Gestational diabetes     insulin dependent   Assessment: CT with pyelo, WBC elevated, starting Rocephin  Plan:  -Ceftriaxone 2g IV q24h -F/U urine culture  Abran DukeLedford, Larren Copes 01/25/2015,1:48 AM

## 2015-01-26 DIAGNOSIS — A4151 Sepsis due to Escherichia coli [E. coli]: Secondary | ICD-10-CM

## 2015-01-26 LAB — BASIC METABOLIC PANEL
Anion gap: 7 (ref 5–15)
CHLORIDE: 109 mmol/L (ref 101–111)
CO2: 23 mmol/L (ref 22–32)
CREATININE: 0.58 mg/dL (ref 0.44–1.00)
Calcium: 8.8 mg/dL — ABNORMAL LOW (ref 8.9–10.3)
GFR calc Af Amer: >60 mL/min (ref >60)
GFR calc non Af Amer: >60 mL/min (ref >60)
GLUCOSE: 122 mg/dL — AB (ref 65–99)
POTASSIUM: 3.7 mmol/L (ref 3.5–5.1)
Sodium: 139 mmol/L (ref 135–145)

## 2015-01-26 LAB — URINE CULTURE: Culture: >=100000

## 2015-01-26 LAB — MAGNESIUM: MAGNESIUM: 1.9 mg/dL (ref 1.7–2.4)

## 2015-01-26 MED ORDER — HYDROCODONE-ACETAMINOPHEN 5-325 MG PO TABS
1.0000 | ORAL_TABLET | Freq: Four times a day (QID) | ORAL | Status: DC | PRN
  Administered 2015-01-26: 1 via ORAL
  Filled 2015-01-26: qty 1

## 2015-01-26 MED ORDER — CEFAZOLIN SODIUM 1-5 GM-% IV SOLN
1.0000 g | Freq: Three times a day (TID) | INTRAVENOUS | Status: DC
  Administered 2015-01-26 (×2): 1 g via INTRAVENOUS
  Filled 2015-01-26 (×6): qty 50

## 2015-01-26 NOTE — Progress Notes (Signed)
Have placed match letter for 34days of meds on shadow chart if needed at disch for meds.

## 2015-01-26 NOTE — Progress Notes (Signed)
CRITICAL VALUE ALERT  Critical value received:  Blood culture Anaerobic Bottle grew gram negative rods  Date of notification:  01/26/2015  Time of notification:  0201  Critical value read back:Yes.    Nurse who received alert:  Darcel Smallingillard, Ann RN  MD notified (1st page):  Benedetto Coons. Callahan  Time of first page:  0210  MD notified (2nd page):  Time of second page:  Responding MD: T.Callahan  Time MD responded:  [phone]

## 2015-01-26 NOTE — Progress Notes (Signed)
Report called and received from RN on [facility]. Pt to be transferred to 5 west room 6.

## 2015-01-26 NOTE — Progress Notes (Signed)
[facility] PROGRESS NOTE  Martha [name] Soc ZOX:[id number] DOB: [birth year] DOA: 01/24/2015 PCP: No PCP Per Patient  Assessment/Plan:  Principal Problem:   Sepsis (HCC): BC growing GNR. Urine culture with e coli sensitive to Ancef and ceftriaxone. Will change to ancef. Blood pressure stable. Transfer to floor. Active Problems:   Pyelonephritis pain improving. Tolerating diet.   Code Status:  full Family Communication:   Disposition Plan:  Home 1-2 days if stable  Consultants:    Procedures:     Antibiotics:  rocephin  HPI/Subjective: Pain improving. tol diet. Feels stronger  Objective: Filed Vitals:   01/26/15 1000 01/26/15 1130  BP: 105/57 95/56  Pulse: 93 87  Temp:  99.1 F (37.3 C)  Resp:  22    Intake/Output Summary (Last 24 hours) at 01/26/15 1427 Last data filed at 01/26/15 1300  Gross per 24 hour  Intake   1440 ml  Output      0 ml  Net   1440 ml   Filed Weights   01/24/15 1643 01/25/15 0100  Weight: 83.28 kg (183 lb 9.6 oz) 88 kg (194 lb 0.1 oz)    Exam:   General:  A and o. Nontoxic. smiling  Cardiovascular: RRR without MGR  Respiratory: CTA without WRR  Abdomen: S, NT, ND  Back: less CVA tenderness on right  Ext: no CCE  Basic Metabolic Panel:  Recent Labs Lab 01/24/15 1645 01/25/15 0727 01/26/15 0301  NA 136 139 139  K 3.8 3.0* 3.7  CL 104 116* 109  CO2 22 19* 23  GLUCOSE 196* 178* 122*  BUN 11 <5* <5*  CREATININE 0.73 0.42* 0.58  CALCIUM 8.9 6.6* 8.8*  MG  --  1.3* 1.9   Liver Function Tests:  Recent Labs Lab 01/24/15 1645 01/25/15 0727  AST 31 22  ALT 34 26  ALKPHOS 86 63  BILITOT 0.6 0.5  PROT 7.1 4.2*  ALBUMIN 3.4* 1.8*   No results for input(s): LIPASE, AMYLASE in the last 168 hours. No results for input(s): AMMONIA in the last 168 hours. CBC:  Recent Labs Lab 01/24/15 1645 01/25/15 0727  WBC 17.0* 12.1*  NEUTROABS 14.1* 10.7*  HGB 12.7 9.2*  HCT 37.4 27.1*  MCV 89.9 90.6  PLT 336 213    Cardiac Enzymes: No results for input(s): CKTOTAL, CKMB, CKMBINDEX, TROPONINI in the last 168 hours. BNP (last 3 results) No results for input(s): BNP in the last 8760 hours.  ProBNP (last 3 results) No results for input(s): PROBNP in the last 8760 hours.  CBG: No results for input(s): GLUCAP in the last 168 hours.  Recent Results (from the past 240 hour(s))  Culture, blood (routine x 2)     Status: None (Preliminary result)   Collection Time: 01/24/15  4:45 PM  Result Value Ref Range Status   Specimen Description BLOOD RIGHT ANTECUBITAL  Final   Special Requests BOTTLES DRAWN AEROBIC AND ANAEROBIC 5CC  Final   Culture  Setup Time   Final    GRAM NEGATIVE RODS ANAEROBIC BOTTLE ONLY CRITICAL RESULT CALLED TO, READ BACK BY AND VERIFIED WITH: A DILLARD @0201  01/26/15 MKELLY    Culture NO GROWTH 2 DAYS  Final   Report Status PENDING  Incomplete  Urine culture     Status: None   Collection Time: 01/24/15  4:49 PM  Result Value Ref Range Status   Specimen Description URINE, RANDOM  Final   Special Requests NONE  Final   Culture >=100,000 COLONIES/mL ESCHERICHIA COLI  Final   Report Status 01/26/2015 FINAL  Final   Organism ID, Bacteria ESCHERICHIA COLI  Final      Susceptibility   Escherichia coli - MIC*    AMPICILLIN >=32 RESISTANT Resistant     CEFAZOLIN <=4 SENSITIVE Sensitive     CEFTRIAXONE <=1 SENSITIVE Sensitive     CIPROFLOXACIN <=0.25 SENSITIVE Sensitive     GENTAMICIN <=1 SENSITIVE Sensitive     IMIPENEM <=0.25 SENSITIVE Sensitive     NITROFURANTOIN <=16 SENSITIVE Sensitive     TRIMETH/SULFA >=320 RESISTANT Resistant     AMPICILLIN/SULBACTAM 16 INTERMEDIATE Intermediate     PIP/TAZO <=4 SENSITIVE Sensitive     * >=100,000 COLONIES/mL ESCHERICHIA COLI  Culture, blood (routine x 2)     Status: None (Preliminary result)   Collection Time: 01/24/15  6:04 PM  Result Value Ref Range Status   Specimen Description BLOOD LEFT ANTECUBITAL  Final   Special Requests  BOTTLES DRAWN AEROBIC AND ANAEROBIC 5CC  Final   Culture NO GROWTH 2 DAYS  Final   Report Status PENDING  Incomplete  MRSA PCR Screening     Status: None   Collection Time: 01/25/15  1:20 AM  Result Value Ref Range Status   MRSA by PCR NEGATIVE NEGATIVE Final    Comment:        The GeneXpert MRSA Assay (FDA approved for NASAL specimens only), is one component of a comprehensive MRSA colonization surveillance program. It is not intended to diagnose MRSA infection nor to guide or monitor treatment for MRSA infections.      Studies: Dg Chest 2 View  01/24/2015  CLINICAL DATA:  RIGHT lower quadrant pain and fever EXAM: CHEST  2 VIEW COMPARISON:  None. FINDINGS: Normal mediastinum and cardiac silhouette. Normal pulmonary vasculature. No evidence of effusion, infiltrate, or pneumothorax. No acute bony abnormality. Post cholecystectomy. IMPRESSION: Normal chest radiograph Electronically Signed   By: Genevive Bi M.D.   On: 01/24/2015 17:32   Ct Abdomen Pelvis W Contrast  01/24/2015  CLINICAL DATA:  Abdominal pain for 3 days EXAM: CT ABDOMEN AND PELVIS WITH CONTRAST TECHNIQUE: Multidetector CT imaging of the abdomen and pelvis was performed using the standard protocol following bolus administration of intravenous contrast. CONTRAST:  OMNIPAQUE IOHEXOL 300 MG/ML  SOLN COMPARISON:  None. FINDINGS: Lung bases are well aerated with very minimal atelectatic changes. No focal confluent infiltrate is seen. The gallbladder has been surgically removed. The liver, spleen, adrenal glands and pancreas are within normal limits. The left kidney demonstrates a normal enhancement pattern. No renal calculi or obstructive changes are seen. The right kidney demonstrates enhancement although some patchy decreased enhancement is noted anteriorly and the kidney is generally enlarged suggestive of pyelonephritis. Surrounding perinephric inflammatory changes are noted as well as a small perinephric fluid collection  inferiorly. A nonobstructing stone is noted in the midportion of the collecting system. No definitive ureteral stone is noted. Bladder is well distended. No intraluminal filling defect is noted. No pelvic mass lesion or free pelvic fluid is seen. The osseous structures show no acute abnormality. IMPRESSION: Changes consistent with pyelonephritis in the right kidney. A nonobstructing right renal stone is noted as well. Electronically Signed   By: Alcide Clever M.D.   On: 01/24/2015 20:18    Scheduled Meds: . cefTRIAXone (ROCEPHIN)  IV  2 g Intravenous Q24H  . Influenza vac split quadrivalent PF  0.5 mL Intramuscular Tomorrow-1000   Continuous Infusions:   Time spent: 15 minutes  Romell Cavanah L  [facility]  [email or web address], password [email or web address] 01/26/2015, 2:27 PM  LOS: 2 days

## 2015-01-26 NOTE — Progress Notes (Signed)
Spoke to Dr. Lendell CapriceSullivan regarding influenza vaccine.  Pt requested to receive it, but has had reaction to vaccine in the past.  Dr. Lendell CapriceSullivan ordered not to give vaccine because of reaction in past.

## 2015-01-27 LAB — HEMOGLOBIN A1C
HEMOGLOBIN A1C: 6.2 % — AB (ref 4.8–5.6)
Mean Plasma Glucose: 131 mg/dL

## 2015-01-27 MED ORDER — CEPHALEXIN 500 MG PO CAPS: 500.0000 mg | ORAL_CAPSULE | Freq: Four times a day (QID) | ORAL | Status: DC

## 2015-01-27 MED ORDER — CEPHALEXIN 500 MG PO CAPS
500.0000 mg | ORAL_CAPSULE | Freq: Once | ORAL | Status: AC
  Administered 2015-01-27: 500 mg via ORAL
  Filled 2015-01-27: qty 1

## 2015-01-27 MED ORDER — PROMETHAZINE HCL 25 MG/ML IJ SOLN
12.5000 mg | Freq: Once | INTRAMUSCULAR | Status: AC
  Administered 2015-01-27: 12.5 mg via INTRAVENOUS
  Filled 2015-01-27: qty 1

## 2015-01-27 NOTE — Discharge Summary (Signed)
Physician Discharge Summary  Martha [name] Soc ZOX:[id number] DOB: February 05, 1985 DOA: 01/24/2015  PCP: No PCP Per Patient  Admit date: 01/24/2015 Discharge date: 01/27/2015   Discharge Diagnoses:  Principal Problem:   Sepsis (HCC)/bacteremia Active Problems:   Pyelonephritis, e coli   Discharge Condition: stable  Diet recommendation: general  Filed Weights   01/24/15 1643 01/25/15 0100 01/26/15 1907  Weight: 83.28 kg (183 lb 9.6 oz) 88 kg (194 lb 0.1 oz) 85.458 kg (188 lb 6.4 oz)    History of present illness:  30 y.o. female with no significant past medical history presents to the ER because of abdominal pain. Patient has been having these symptoms for last 2-3 days. Pain is mostly in the right flank area radiating to the groin. Has been having some nausea and dysuria. In the ER patient was found to be tachycardic and febrile. CT of the abdomen shows features consistent with right-sided pyelonephritis. There is also a nonobstructing stone. Patient has been admitted for sepsis secondary to pyelonephritis.    Hospital Course:  Admitted to stepdown. Fluid recussitated, started on ceftriaxone. Urine culture grew e coli, sensitive to ancef and rocephin. Changed to ancef. Blood culture growing GNR, presumed e coli. By discharge, afebrile, pain improved, blood pressure stable and requesting discharge  Procedures: None  Consultations:  none  Discharge Exam: Filed Vitals:   01/26/15 2141 01/27/15 0607  BP: 120/76 107/66  Pulse: 95 102  Temp: 99 F (37.2 C) 99.3 F (37.4 C)  Resp: 18 18    General: nontoxic. A and o Cardiovascular: RRR Respiratory: CTA abd s, nt, nd No CCE  Discharge Instructions   Discharge Instructions    Activity as tolerated - No restrictions    Complete by:  As directed      Diet general    Complete by:  As directed           Current Discharge Medication List    START taking these medications   Details  cephALEXin (KEFLEX) 500 MG capsule Take 1  capsule (500 mg total) by mouth 4 (four) times daily. Qty: 44 capsule, Refills: 0      CONTINUE these medications which have NOT CHANGED   Details  acetaminophen (TYLENOL) 325 MG tablet Take 325 mg by mouth every 6 (six) hours as needed for moderate pain.     Flaxseed, Linseed, (FLAX SEED OIL PO) Take 2 capsules by mouth daily.    oxyCODONE-acetaminophen (PERCOCET/ROXICET) 5-325 MG per tablet Take 1 tablet by mouth every 4 (four) hours as needed (for pain scale less than 7). Qty: 30 tablet, Refills: 0    prenatal vitamin w/FE, FA (NATACHEW) 29-1 MG CHEW chewable tablet Chew 1 tablet by mouth 3 (three) times daily.    Associated Diagnoses: Unspecified high-risk pregnancy      STOP taking these medications     hydrOXYzine (ATARAX/VISTARIL) 25 MG tablet        Allergies  Allergen Reactions  . Influenza Vaccines Itching and Rash   Follow-up Information    Follow up with  [facility].   Why:  If symptoms worsen   Contact information:   [address] [facility] [address] [phone]       The results of significant diagnostics from this hospitalization (including imaging, microbiology, ancillary and laboratory) are listed below for reference.    Significant Diagnostic Studies: Dg Chest 2 View  01/24/2015  CLINICAL DATA:  RIGHT lower quadrant pain and fever EXAM: CHEST  2 VIEW COMPARISON:  None. FINDINGS: Normal mediastinum and cardiac silhouette. Normal pulmonary vasculature. No evidence of effusion, infiltrate, or pneumothorax. No acute bony abnormality. Post cholecystectomy. IMPRESSION: Normal chest radiograph Electronically Signed   By: Genevive Bi M.D.   On: 01/24/2015 17:32   Ct Abdomen Pelvis W Contrast  01/24/2015  CLINICAL DATA:  Abdominal pain for 3 days EXAM: CT ABDOMEN AND PELVIS WITH CONTRAST TECHNIQUE: Multidetector CT imaging of the abdomen and pelvis was performed using the standard protocol following bolus  administration of intravenous contrast. CONTRAST:  OMNIPAQUE IOHEXOL 300 MG/ML  SOLN COMPARISON:  None. FINDINGS: Lung bases are well aerated with very minimal atelectatic changes. No focal confluent infiltrate is seen. The gallbladder has been surgically removed. The liver, spleen, adrenal glands and pancreas are within normal limits. The left kidney demonstrates a normal enhancement pattern. No renal calculi or obstructive changes are seen. The right kidney demonstrates enhancement although some patchy decreased enhancement is noted anteriorly and the kidney is generally enlarged suggestive of pyelonephritis. Surrounding perinephric inflammatory changes are noted as well as a small perinephric fluid collection inferiorly. A nonobstructing stone is noted in the midportion of the collecting system. No definitive ureteral stone is noted. Bladder is well distended. No intraluminal filling defect is noted. No pelvic mass lesion or free pelvic fluid is seen. The osseous structures show no acute abnormality. IMPRESSION: Changes consistent with pyelonephritis in the right kidney. A nonobstructing right renal stone is noted as well. Electronically Signed   By: Alcide Clever M.D.   On: 01/24/2015 20:18    Microbiology: Recent Results (from the past 240 hour(s))  Culture, blood (routine x 2)     Status: None (Preliminary result)   Collection Time: 01/24/15  4:45 PM  Result Value Ref Range Status   Specimen Description BLOOD RIGHT ANTECUBITAL  Final   Special Requests BOTTLES DRAWN AEROBIC AND ANAEROBIC 5CC  Final   Culture  Setup Time   Final    GRAM NEGATIVE RODS ANAEROBIC BOTTLE ONLY CRITICAL RESULT CALLED TO, READ BACK BY AND VERIFIED WITH: A DILLARD @0201  01/26/15 MKELLY    Culture GRAM NEGATIVE RODS  Final   Report Status PENDING  Incomplete  Urine culture     Status: None   Collection Time: 01/24/15  4:49 PM  Result Value Ref Range Status   Specimen Description URINE, RANDOM  Final   Special  Requests NONE  Final   Culture >=100,000 COLONIES/mL ESCHERICHIA COLI  Final   Report Status 01/26/2015 FINAL  Final   Organism ID, Bacteria ESCHERICHIA COLI  Final      Susceptibility   Escherichia coli - MIC*    AMPICILLIN >=32 RESISTANT Resistant     CEFAZOLIN <=4 SENSITIVE Sensitive     CEFTRIAXONE <=1 SENSITIVE Sensitive     CIPROFLOXACIN <=0.25 SENSITIVE Sensitive     GENTAMICIN <=1 SENSITIVE Sensitive     IMIPENEM <=0.25 SENSITIVE Sensitive     NITROFURANTOIN <=16 SENSITIVE Sensitive     TRIMETH/SULFA >=320 RESISTANT Resistant     AMPICILLIN/SULBACTAM 16 INTERMEDIATE Intermediate     PIP/TAZO <=4 SENSITIVE Sensitive     * >=100,000 COLONIES/mL ESCHERICHIA COLI  Culture, blood (routine x 2)     Status: None (Preliminary result)   Collection Time: 01/24/15  6:04 PM  Result Value Ref Range Status   Specimen Description BLOOD LEFT ANTECUBITAL  Final   Special Requests BOTTLES DRAWN AEROBIC AND ANAEROBIC 5CC  Final   Culture NO GROWTH 2 DAYS  Final  Report Status PENDING  Incomplete  MRSA PCR Screening     Status: None   Collection Time: 01/25/15  1:20 AM  Result Value Ref Range Status   MRSA by PCR NEGATIVE NEGATIVE Final    Comment:        The GeneXpert MRSA Assay (FDA approved for NASAL specimens only), is one component of a comprehensive MRSA colonization surveillance program. It is not intended to diagnose MRSA infection nor to guide or monitor treatment for MRSA infections.      Labs: Basic Metabolic Panel:  Recent Labs Lab 01/24/15 1645 01/25/15 0727 01/26/15 0301  NA 136 139 139  K 3.8 3.0* 3.7  CL 104 116* 109  CO2 22 19* 23  GLUCOSE 196* 178* 122*  BUN 11 <5* <5*  CREATININE 0.73 0.42* 0.58  CALCIUM 8.9 6.6* 8.8*  MG  --  1.3* 1.9   Liver Function Tests:  Recent Labs Lab 01/24/15 1645 01/25/15 0727  AST 31 22  ALT 34 26  ALKPHOS 86 63  BILITOT 0.6 0.5  PROT 7.1 4.2*  ALBUMIN 3.4* 1.8*   No results for input(s): LIPASE, AMYLASE in  the last 168 hours. No results for input(s): AMMONIA in the last 168 hours. CBC:  Recent Labs Lab 01/24/15 1645 01/25/15 0727  WBC 17.0* 12.1*  NEUTROABS 14.1* 10.7*  HGB 12.7 9.2*  HCT 37.4 27.1*  MCV 89.9 90.6  PLT 336 213   Cardiac Enzymes: No results for input(s): CKTOTAL, CKMB, CKMBINDEX, TROPONINI in the last 168 hours. BNP: BNP (last 3 results) No results for input(s): BNP in the last 8760 hours.  ProBNP (last 3 results) No results for input(s): PROBNP in the last 8760 hours.  CBG: No results for input(s): GLUCAP in the last 168 hours.     Signed:  Christiane HaSULLIVAN,Leydy Worthey L MD  FACP  [facility] 01/27/2015, 8:53 AM

## 2015-01-27 NOTE — Progress Notes (Signed)
To whom it may concern:   Martha [name] was hospitalized from 01/24/15 through 01/27/15.   Sincerely,    Crista Curborinna Kamaryn Grimley, MD [facility]

## 2015-01-27 NOTE — Progress Notes (Signed)
01/27/15 Patient being discharged home today, no IV site, and discharge instructions reviewed with patient.

## 2015-01-27 NOTE — Progress Notes (Signed)
To whom it may concern:   Martha [name] Soc is unable to work from 01/24/15 through 01/28/15 due to illness.  May return to full duty on 01/28/15.   Sincerely,    Crista Curborinna Rowan Blaker, MD [facility]

## 2015-01-28 LAB — CULTURE, BLOOD (ROUTINE X 2)

## 2015-01-29 LAB — CULTURE, BLOOD (ROUTINE X 2): Culture: NO GROWTH

## 2015-06-01 ENCOUNTER — Ambulatory Visit: Payer: Medicaid Other | Attending: Internal Medicine

## 2015-07-21 ENCOUNTER — Encounter (HOSPITAL_COMMUNITY): Payer: Self-pay | Admitting: Emergency Medicine

## 2015-07-21 ENCOUNTER — Ambulatory Visit (HOSPITAL_COMMUNITY)
Admission: EM | Admit: 2015-07-21 | Discharge: 2015-07-21 | Disposition: A | Discharge status: Home / Self Care | Payer: MEDICAID | Attending: Emergency Medicine | Admitting: Emergency Medicine

## 2015-07-21 DIAGNOSIS — J069 Acute upper respiratory infection, unspecified: Secondary | ICD-10-CM | POA: Insufficient documentation

## 2015-07-21 LAB — POCT RAPID STREP A: Streptococcus, Group A Screen (Direct): NEGATIVE

## 2015-07-21 MED ORDER — IPRATROPIUM BROMIDE 0.06 % NA SOLN: 2.0000 | Freq: Four times a day (QID) | NASAL | Status: DC

## 2015-07-21 NOTE — ED Provider Notes (Signed)
CSN: [id number]     Arrival date & time 07/21/15  1610 History   First MD Initiated Contact with Patient 07/21/15 1636     Chief Complaint  Patient presents with  . URI   (Consider location/radiation/quality/duration/timing/severity/associated sxs/prior Treatment) HPI Comments: 30 year old Spanish female states that for the past 3-4 days she has been feeling ill having symptoms of headache, subjective fever, nasal stuffiness, runny nose, PND. In the past few days, 2, 3 and 4 days ago she had an episode of vomiting each day. No vomiting or GI symptoms today. Denies cough. She does have ear discomfort but no pain. She has been taking acetaminophen and ibuprofen.   Past Medical History  Diagnosis Date  . Gestational diabetes     insulin dependent  . Kidney infection 0105/2017   Past Surgical History  Procedure Laterality Date  . Cholecystectomy    . Appendectomy     Family History  Problem Relation Age of Onset  . Diabetes Mother   . Heart disease Father   . Mental retardation Sister    Social History  Substance Use Topics  . Smoking status: Never Smoker   . Smokeless tobacco: Never Used  . Alcohol Use: No   OB History    Gravida Para Term Preterm AB TAB SAB Ectopic Multiple Living   4 4 3 1      4      Review of Systems  Constitutional: Positive for fever and activity change. Negative for chills, diaphoresis, appetite change and fatigue.  HENT: Positive for congestion, postnasal drip and rhinorrhea. Negative for facial swelling, sore throat and trouble swallowing.   Eyes: Negative.   Respiratory: Negative.  Negative for cough and shortness of breath.   Cardiovascular: Negative.   Gastrointestinal: Positive for nausea and vomiting. Negative for abdominal pain.  Musculoskeletal: Negative for neck pain and neck stiffness.  Skin: Negative.  Negative for pallor and rash.  Neurological: Negative.   All other systems reviewed and are negative.   Allergies  Influenza  vaccines  Home Medications   Prior to Admission medications   Medication Sig Start Date End Date Taking? Authorizing Provider  acetaminophen (TYLENOL) 325 MG tablet Take 325 mg by mouth every 6 (six) hours as needed for moderate pain.     Historical Provider, MD  cephALEXin (KEFLEX) 500 MG capsule Take 1 capsule (500 mg total) by mouth 4 (four) times daily. 01/27/15   Christiane Haorinna L Sullivan, MD  Flaxseed, Linseed, (FLAX SEED OIL PO) Take 2 capsules by mouth daily.    Historical Provider, MD  ipratropium (ATROVENT) 0.06 % nasal spray Place 2 sprays into both nostrils 4 (four) times daily. 07/21/15   Hayden Rasmussenavid Itxel Wickard, NP  oxyCODONE-acetaminophen (PERCOCET/ROXICET) 5-325 MG per tablet Take 1 tablet by mouth every 4 (four) hours as needed (for pain scale less than 7). 11/23/13   Gwen PoundsKari Koch, MD  prenatal vitamin w/FE, FA (NATACHEW) 29-1 MG CHEW chewable tablet Chew 1 tablet by mouth 3 (three) times daily.     Historical Provider, MD   Meds Ordered and Administered this Visit  Medications - No data to display  BP 115/77 mmHg  Pulse 64  Temp(Src) 98.7 F (37.1 C) (Oral)  Resp 18  Ht 5\' 2"  (1.575 m)  Wt 180 lb (81.647 kg)  BMI 32.91 kg/m2  SpO2 100%  LMP 07/12/2015  Breastfeeding? Yes No data found.   Physical Exam  Constitutional: She is oriented to person, place, and time. She appears well-developed and well-nourished. No distress.  HENT:  Mouth/Throat: No oropharyngeal exudate.  Bilateral TMs are mildly retracted. Pearly gray translucent. No effusion, erythema. EACs are clear of debris and cerumen. Oropharynx with minor erythema, cobblestoning and scant clear PND.  Eyes: Conjunctivae and EOM are normal.  Neck: Normal range of motion. Neck supple.  Cardiovascular: Normal rate, regular rhythm and normal heart sounds.   Pulmonary/Chest: Effort normal and breath sounds normal. No respiratory distress. She has no wheezes.  Abdominal: Soft. There is no tenderness.  Musculoskeletal: Normal range of  motion. She exhibits no edema.  Lymphadenopathy:    She has no cervical adenopathy.  Neurological: She is alert and oriented to person, place, and time.  Skin: Skin is warm and dry. No rash noted.  Psychiatric: She has a normal mood and affect.  Nursing note and vitals reviewed.   ED Course  Procedures (including critical care time)  Labs Review Labs Reviewed  POCT RAPID STREP A   Results for orders placed or performed during the hospital encounter of 07/21/15  POCT rapid strep A [facility])  Result Value Ref Range   Streptococcus, Group A Screen (Direct) NEGATIVE NEGATIVE     Imaging Review No results found.   Visual Acuity Review  Right Eye Distance:   Left Eye Distance:   Bilateral Distance:    Right Eye Near:   Left Eye Near:    Bilateral Near:         MDM   1. URI (upper respiratory infection)    For nasal and head congestion may take Sudafed PE 10 mg every 4 hours as needed. Saline nasal spray used frequently. For drainage may use Allegra, Claritin or Zyrtec. If you need stronger medicine to stop drainage may take Chlor-Trimeton 2-4 mg every 4 hours. This may cause drowsiness. Ibuprofen 600 mg every 6 hours as needed for pain, discomfort or fever. Drink plenty of fluids and stay well-hydrated. Meds ordered this encounter  Medications  . ipratropium (ATROVENT) 0.06 % nasal spray    Sig: Place 2 sprays into both nostrils 4 (four) times daily.    Dispense:  15 mL    Refill:  0    Order Specific Question:  Supervising Provider    Answer:  Charm RingsHONIG, ERIN J [[id number]]       Hayden Rasmussenavid Alexsandra Shontz, NP 07/21/15 1708

## 2015-07-21 NOTE — ED Notes (Signed)
Here for cold sx onset x4 days associated w/fevers, BA, HA, ST, v/d, bilateral ear clogged A&O x4... NAD

## 2015-07-21 NOTE — Discharge Instructions (Signed)
Infeccin del tracto respiratorio superior, adultos (Upper Respiratory Infection, Adult) For nasal and head congestion may take Sudafed PE 10 mg every 4 hours as needed. Saline nasal spray used frequently. For drainage may use Allegra, Claritin or Zyrtec. If you need stronger medicine to stop drainage may take Chlor-Trimeton 2-4 mg every 4 hours. This may cause drowsiness. Ibuprofen 600 mg every 6 hours as needed for pain, discomfort or fever. Drink plenty of fluids and stay well-hydrated.  La mayora de las infecciones del tracto respiratorio superior estn causadas por un virus. Un infeccin del tracto respiratorio superior afecta la nariz, la garganta y las vas respiratorias superiores. El tipo ms comn de infeccin del tracto respiratorio superior es el resfro comn. CUIDADOS EN EL HOGAR   Tome los medicamentos solamente como se lo haya indicado el mdico.  A fin de Engineer, materialsaliviar el dolor de garganta, haga grgaras con solucin salina templada o consuma caramelos para la tos, como se lo haya indicado el mdico.  Use un humidificador de vapor clido o inhale el vapor de la ducha para aumentar la humedad del aire. Esto facilitar la respiracin.  Beba suficiente lquido para mantener el pis (orina) claro o de color amarillo plido.  Tome sopas y caldos transparentes.  Siga una dieta saludable.  Descanse todo lo que sea necesario.  Regrese al Aleen Campitrabajo cuando la fiebre haya desaparecido o el mdico le diga que puede [place] Haven[place]acerlo.  Es posible que deba quedarse en su casa durante un tiempo prolongado, para no transmitir la infeccin a los dems.  Tambin puede usar un barbijo y lavarse las manos con frecuencia para evitar el contagio del virus.  Si tiene asma, use el inhalador con mayor frecuencia.  No consuma ningn producto que contenga tabaco, lo que incluye cigarrillos, tabaco de Theatre managermascar o Administrator, Civil Servicecigarrillos electrnicos. Si necesita ayuda para dejar de fumar, consulte al mdico. SOLICITE AYUDA  SI:  Siente que empeora o que no mejora.  Los medicamentos no logran [employer]bury Automotive Groupaliviar los sntomas.  Tiene escalofros.  La dificultad para Clinical cytogeneticistrespirar es peor.  Tiene mucosidad marrn o roja.  Tiene una secrecin amarilla o marrn de la Clinical cytogeneticistnariz.  Le duele la cara, especialmente al inclinarse hacia adelante.  Tiene fiebre.  Tiene los ganglios del cuello hinchados.  Siente dolor al tragar.  Tiene zonas blancas en la parte de atrs de la garganta. SOLICITE AYUDA DE INMEDIATO SI:   Los siguientes sntomas son muy intensos o constantes:  Dolor de Turkmenistancabeza.  Dolor de odos.  Dolor en la frente, detrs de los ojos y por encima de los pmulos (dolor sinusal).  Dolor en el pecho.  Tiene enfermedad pulmonar prolongada (crnica) y cualquiera de estos sntomas:  Sibilancias.  Tos prolongada.  Tos con sangre.  Cambio en la mucosidad habitual.  Presenta rigidez en el cuello.  Tiene cambios en:  La visin.  La audicin.  El pensamiento.  El Coultervilleestado de nimo. ASEGRESE DE QUE:   Comprende estas instrucciones.  Controlar su afeccin.  Recibir ayuda de inmediato si no mejora o si empeora.   Esta informacin no tiene Theme park managercomo fin reemplazar el consejo del mdico. Asegrese de hacerle al mdico cualquier pregunta que tenga.   Document Released: 06/10/2010 Document Revised: 05/23/2014 Elsevier Interactive Patient Education [employer]2016 Elsevier Inc.

## 2015-07-24 LAB — CULTURE, GROUP A STREP (THRC)
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
## 2016-03-17 ENCOUNTER — Ambulatory Visit (INDEPENDENT_AMBULATORY_CARE_PROVIDER_SITE_OTHER): Payer: Worker's Compensation | Admitting: Family Medicine

## 2016-03-17 VITALS — BP 120/84 | HR 82 | Temp 97.2°F | Resp 16 | Ht 62.0 in | Wt 190.0 lb

## 2016-03-17 DIAGNOSIS — Y990 Civilian activity done for income or pay: Secondary | ICD-10-CM

## 2016-03-17 DIAGNOSIS — S65519A Laceration of blood vessel of unspecified finger, initial encounter: Secondary | ICD-10-CM

## 2016-03-17 NOTE — Patient Instructions (Addendum)
Return in March 3rd for follow-up evaluation.   INGROWN TOENAIL . Keep area clean, dry and bandaged for 24 hours. . After 24 hours, remove outer bandage and leave yellow gauze in place. Martha [name]. Soak toe/foot in warm soapy water for 5-10 minutes, once daily for 5 days. Rebandage toe after each cleaning. . Continue soaks until yellow gauze falls off. . Notify the office if you experience any of the following signs of infection: Swelling, redness, pus drainage, streaking, fever > 101.0 F      IF you received an x-ray today, you will receive an invoice from [facility]. Please contact [facility] at [phone] with questions or concerns regarding your invoice.   IF you received labwork today, you will receive an invoice from [facility]. Please contact [facility] at [phone] with questions or concerns regarding your invoice.   Our billing staff will not be able to assist you with questions regarding bills from these companies.  You will be contacted with the lab results as soon as they are available. The fastest way to get your results is to activate your My Chart account. Instructions are located on the last page of this paperwork. If you have not heard from us regarding the results in 2 weeks, please contact this office.
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
## 2016-03-17 NOTE — Progress Notes (Deleted)
     IF you received an x-ray today, you will receive an invoice from [facility]. Please contact  Radiology at [phone] with questions or concerns regarding your invoice.   IF you received labwork today, you will receive an invoice from [facility]. Please contact [facility] at [phone] with questions or concerns regarding your invoice.   Our billing staff will not be able to assist you with questions regarding bills from these companies.  You will be contacted with the lab results as soon as they are available. The fastest way to get your results is to activate your My Chart account. Instructions are located on the last page of this paperwork. If you have not heard from us regarding the results in 2 weeks, please contact this office.     

## 2016-03-24 ENCOUNTER — Ambulatory Visit (INDEPENDENT_AMBULATORY_CARE_PROVIDER_SITE_OTHER): Payer: Worker's Compensation | Admitting: Family Medicine

## 2016-03-24 VITALS — HR 97

## 2016-03-24 DIAGNOSIS — S61313D Laceration without foreign body of left middle finger with damage to nail, subsequent encounter: Secondary | ICD-10-CM

## 2016-03-24 NOTE — Progress Notes (Signed)
Temp 97.5 oral  Workers Comp Follow-up  Date of Injury: 03/17/16  Laceration of the 3rd Digit   HPI  Summary of injury from 03/17/16 Pt reports that as she was cutting cabbage with a cutting processor machine, the 3rd digit of her right hand inadvertently made contact with the blade cutting a portion of her fingernail completely off 3rd digit and exposing the left later side of nail bed for the right hand.  Today patient was seen in office today for follow-up of finger laceration. Wound is healing appropriately. Mild tenderness with palpation. Full sensation and mobility noted. Pt request a work note removing all restrictions.  Plan: No additional follow-up required. Work noted provided lifting all restrictions.  Godfrey PickKimberly S. Tiburcio PeaHarris, MSN, FNP-C [facility] at [facility] [phone]

## 2016-07-31 ENCOUNTER — Emergency Department (HOSPITAL_COMMUNITY): Payer: Self-pay

## 2016-07-31 ENCOUNTER — Emergency Department (HOSPITAL_COMMUNITY)
Admission: EM | Admit: 2016-07-31 | Discharge: 2016-07-31 | Disposition: A | Discharge status: Home / Self Care | Payer: Self-pay | Attending: Emergency Medicine | Admitting: Emergency Medicine

## 2016-07-31 ENCOUNTER — Encounter (HOSPITAL_COMMUNITY): Payer: Self-pay | Admitting: Emergency Medicine

## 2016-07-31 DIAGNOSIS — R1084 Generalized abdominal pain: Secondary | ICD-10-CM | POA: Insufficient documentation

## 2016-07-31 DIAGNOSIS — R197 Diarrhea, unspecified: Secondary | ICD-10-CM

## 2016-07-31 DIAGNOSIS — R319 Hematuria, unspecified: Secondary | ICD-10-CM | POA: Insufficient documentation

## 2016-07-31 DIAGNOSIS — R112 Nausea with vomiting, unspecified: Secondary | ICD-10-CM

## 2016-07-31 LAB — COMPREHENSIVE METABOLIC PANEL
ALBUMIN: 4.3 g/dL (ref 3.5–5.0)
ALT: 45 U/L (ref 14–54)
ANION GAP: 12 (ref 5–15)
AST: 36 U/L (ref 15–41)
Alkaline Phosphatase: 83 U/L (ref 38–126)
BUN: 8 mg/dL (ref 6–20)
CO2: 21 mmol/L — AB (ref 22–32)
Calcium: 9.8 mg/dL (ref 8.9–10.3)
Chloride: 101 mmol/L (ref 101–111)
Creatinine, Ser: 0.67 mg/dL (ref 0.44–1.00)
GFR calc non Af Amer: >60 mL/min (ref >60)
Glucose, Bld: 194 mg/dL — ABNORMAL HIGH (ref 65–99)
POTASSIUM: 3.7 mmol/L (ref 3.5–5.1)
SODIUM: 134 mmol/L — AB (ref 135–145)
TOTAL PROTEIN: 8 g/dL (ref 6.5–8.1)
Total Bilirubin: 0.7 mg/dL (ref 0.3–1.2)

## 2016-07-31 LAB — URINALYSIS, ROUTINE W REFLEX MICROSCOPIC
BILIRUBIN URINE: NEGATIVE
GLUCOSE, UA: NEGATIVE mg/dL
KETONES UR: NEGATIVE mg/dL
Leukocytes, UA: NEGATIVE
NITRITE: NEGATIVE
PROTEIN: 100 mg/dL — AB
Specific Gravity, Urine: 1.015 (ref 1.005–1.030)
pH: 6 (ref 5.0–8.0)

## 2016-07-31 LAB — CBC
HEMATOCRIT: 40.5 % (ref 36.0–46.0)
HEMOGLOBIN: 13.9 g/dL (ref 12.0–15.0)
MCH: 30.1 pg (ref 26.0–34.0)
MCHC: 34.3 g/dL (ref 30.0–36.0)
MCV: 87.7 fL (ref 78.0–100.0)
Platelets: 341 10*3/uL (ref 150–400)
RBC: 4.62 MIL/uL (ref 3.87–5.11)
RDW: 12.5 % (ref 11.5–15.5)
WBC: 10.1 10*3/uL (ref 4.0–10.5)

## 2016-07-31 LAB — I-STAT BETA HCG BLOOD, ED (MC, WL, AP ONLY): I-stat hCG, quantitative: <5 m[IU]/mL (ref <5)

## 2016-07-31 LAB — LIPASE, BLOOD: Lipase: 23 U/L (ref 11–51)

## 2016-07-31 MED ORDER — KETOROLAC TROMETHAMINE 30 MG/ML IJ SOLN
30.0000 mg | Freq: Once | INTRAMUSCULAR | Status: AC
  Administered 2016-07-31: 30 mg via INTRAVENOUS
  Filled 2016-07-31: qty 1

## 2016-07-31 MED ORDER — ONDANSETRON HCL 4 MG/2ML IJ SOLN
4.0000 mg | Freq: Once | INTRAMUSCULAR | Status: AC
  Administered 2016-07-31: 4 mg via INTRAVENOUS
  Filled 2016-07-31: qty 2

## 2016-07-31 MED ORDER — ONDANSETRON 4 MG PO TBDP
ORAL_TABLET | ORAL | Status: AC
  Filled 2016-07-31: qty 1

## 2016-07-31 MED ORDER — DICYCLOMINE HCL 20 MG PO TABS: 20.0000 mg | ORAL_TABLET | Freq: Two times a day (BID) | ORAL | 0 refills | Status: DC

## 2016-07-31 MED ORDER — ONDANSETRON 4 MG PO TBDP
4.0000 mg | ORAL_TABLET | Freq: Once | ORAL | Status: AC | PRN
  Administered 2016-07-31: 4 mg via ORAL

## 2016-07-31 MED ORDER — LOPERAMIDE HCL 2 MG PO CAPS: 2.0000 mg | ORAL_CAPSULE | Freq: Four times a day (QID) | ORAL | 0 refills | Status: DC | PRN

## 2016-07-31 MED ORDER — ONDANSETRON 4 MG PO TBDP: 4.0000 mg | ORAL_TABLET | Freq: Three times a day (TID) | ORAL | 0 refills | Status: DC | PRN

## 2016-07-31 MED ORDER — SODIUM CHLORIDE 0.9 % IV BOLUS (SEPSIS)
1000.0000 mL | Freq: Once | INTRAVENOUS | Status: AC
  Administered 2016-07-31: 1000 mL via INTRAVENOUS

## 2016-07-31 NOTE — ED Notes (Signed)
Patient Alert and oriented X4. Stable and ambulatory. Patient verbalized understanding of the discharge instructions.  Patient belongings were taken by the patient.  

## 2016-07-31 NOTE — ED Notes (Signed)
Patient is resting comfortably, back on monitor

## 2016-07-31 NOTE — Discharge Instructions (Signed)

## 2016-07-31 NOTE — ED Triage Notes (Signed)
Pt here for upper abd pain and N/V/D x 2 days

## 2016-07-31 NOTE — ED Notes (Signed)
Informed Hannah - RN of pt's vitals.
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
## 2016-08-06 ENCOUNTER — Emergency Department (HOSPITAL_COMMUNITY): Payer: Self-pay

## 2016-08-06 ENCOUNTER — Emergency Department (HOSPITAL_COMMUNITY)
Admission: EM | Admit: 2016-08-06 | Discharge: 2016-08-06 | Disposition: A | Discharge status: Home / Self Care | Payer: Self-pay | Attending: Emergency Medicine | Admitting: Emergency Medicine

## 2016-08-06 ENCOUNTER — Encounter (HOSPITAL_COMMUNITY): Payer: Self-pay | Admitting: Emergency Medicine

## 2016-08-06 DIAGNOSIS — N730 Acute parametritis and pelvic cellulitis: Secondary | ICD-10-CM

## 2016-08-06 DIAGNOSIS — R739 Hyperglycemia, unspecified: Secondary | ICD-10-CM | POA: Insufficient documentation

## 2016-08-06 DIAGNOSIS — N3000 Acute cystitis without hematuria: Secondary | ICD-10-CM | POA: Insufficient documentation

## 2016-08-06 DIAGNOSIS — N739 Female pelvic inflammatory disease, unspecified: Secondary | ICD-10-CM | POA: Insufficient documentation

## 2016-08-06 LAB — COMPREHENSIVE METABOLIC PANEL
ALBUMIN: 2.9 g/dL — AB (ref 3.5–5.0)
ALK PHOS: 113 U/L (ref 38–126)
ALT: 41 U/L (ref 14–54)
ANION GAP: 10 (ref 5–15)
AST: 27 U/L (ref 15–41)
BUN: 5 mg/dL — AB (ref 6–20)
CO2: 24 mmol/L (ref 22–32)
CREATININE: 0.76 mg/dL (ref 0.44–1.00)
Calcium: 9 mg/dL (ref 8.9–10.3)
Chloride: 94 mmol/L — ABNORMAL LOW (ref 101–111)
GFR calc Af Amer: >60 mL/min (ref >60)
GFR calc non Af Amer: >60 mL/min (ref >60)
GLUCOSE: 383 mg/dL — AB (ref 65–99)
Potassium: 3.7 mmol/L (ref 3.5–5.1)
SODIUM: 128 mmol/L — AB (ref 135–145)
Total Bilirubin: 0.4 mg/dL (ref 0.3–1.2)
Total Protein: 7.2 g/dL (ref 6.5–8.1)

## 2016-08-06 LAB — URINALYSIS, ROUTINE W REFLEX MICROSCOPIC
Bilirubin Urine: NEGATIVE
Glucose, UA: >=500 mg/dL — AB
Ketones, ur: NEGATIVE mg/dL
Nitrite: NEGATIVE
PH: 8 (ref 5.0–8.0)
Protein, ur: NEGATIVE mg/dL
SPECIFIC GRAVITY, URINE: 1.007 (ref 1.005–1.030)

## 2016-08-06 LAB — GC/CHLAMYDIA PROBE AMP (~~LOC~~) NOT AT ARMC
Chlamydia: NEGATIVE
NEISSERIA GONORRHEA: NEGATIVE

## 2016-08-06 LAB — CBC
HCT: 36.1 % (ref 36.0–46.0)
HEMOGLOBIN: 12.2 g/dL (ref 12.0–15.0)
MCH: 29.3 pg (ref 26.0–34.0)
MCHC: 33.8 g/dL (ref 30.0–36.0)
MCV: 86.6 fL (ref 78.0–100.0)
Platelets: 414 10*3/uL — ABNORMAL HIGH (ref 150–400)
RBC: 4.17 MIL/uL (ref 3.87–5.11)
RDW: 12.9 % (ref 11.5–15.5)
WBC: 8.6 10*3/uL (ref 4.0–10.5)

## 2016-08-06 LAB — CBG MONITORING, ED
GLUCOSE-CAPILLARY: 332 mg/dL — AB (ref 65–99)
Glucose-Capillary: 261 mg/dL — ABNORMAL HIGH (ref 65–99)

## 2016-08-06 LAB — LIPASE, BLOOD: Lipase: 19 U/L (ref 11–51)

## 2016-08-06 LAB — I-STAT CG4 LACTIC ACID, ED: LACTIC ACID, VENOUS: 1.86 mmol/L (ref 0.5–1.9)

## 2016-08-06 LAB — WET PREP, GENITAL
Clue Cells Wet Prep HPF POC: NONE SEEN
Sperm: NONE SEEN
Trich, Wet Prep: NONE SEEN
YEAST WET PREP: NONE SEEN

## 2016-08-06 LAB — I-STAT BETA HCG BLOOD, ED (MC, WL, AP ONLY)

## 2016-08-06 MED ORDER — SODIUM CHLORIDE 0.9 % IV BOLUS (SEPSIS)
1000.0000 mL | Freq: Once | INTRAVENOUS | Status: AC
  Administered 2016-08-06: 1000 mL via INTRAVENOUS

## 2016-08-06 MED ORDER — INSULIN ASPART 100 UNIT/ML ~~LOC~~ SOLN
6.0000 [IU] | Freq: Once | SUBCUTANEOUS | Status: AC
  Administered 2016-08-06: 6 [IU] via SUBCUTANEOUS
  Filled 2016-08-06: qty 1

## 2016-08-06 MED ORDER — CEPHALEXIN 500 MG PO CAPS: 500.0000 mg | ORAL_CAPSULE | Freq: Two times a day (BID) | ORAL | 0 refills | Status: DC

## 2016-08-06 MED ORDER — METFORMIN HCL 500 MG PO TABS
500.0000 mg | ORAL_TABLET | Freq: Once | ORAL | Status: AC
  Administered 2016-08-06: 500 mg via ORAL
  Filled 2016-08-06: qty 1

## 2016-08-06 MED ORDER — DEXTROSE 5 % IV SOLN
1.0000 g | Freq: Once | INTRAVENOUS | Status: AC
  Administered 2016-08-06: 1 g via INTRAVENOUS
  Filled 2016-08-06: qty 10

## 2016-08-06 MED ORDER — KETOROLAC TROMETHAMINE 30 MG/ML IJ SOLN
30.0000 mg | Freq: Once | INTRAMUSCULAR | Status: AC
  Administered 2016-08-06: 30 mg via INTRAVENOUS
  Filled 2016-08-06: qty 1

## 2016-08-06 MED ORDER — ACETAMINOPHEN 325 MG PO TABS
650.0000 mg | ORAL_TABLET | Freq: Once | ORAL | Status: AC | PRN
Start: 2016-08-06 — End: 2016-08-06
  Administered 2016-08-06: 650 mg via ORAL

## 2016-08-06 MED ORDER — ONDANSETRON HCL 4 MG/2ML IJ SOLN
4.0000 mg | Freq: Once | INTRAMUSCULAR | Status: AC
  Administered 2016-08-06: 4 mg via INTRAVENOUS
  Filled 2016-08-06: qty 2

## 2016-08-06 MED ORDER — AZITHROMYCIN 250 MG PO TABS
1000.0000 mg | ORAL_TABLET | Freq: Once | ORAL | Status: AC
  Administered 2016-08-06: 1000 mg via ORAL
  Filled 2016-08-06: qty 4

## 2016-08-06 MED ORDER — METFORMIN HCL 500 MG PO TABS: 500.0000 mg | ORAL_TABLET | Freq: Two times a day (BID) | ORAL | 0 refills | Status: DC

## 2016-08-06 MED ORDER — ACETAMINOPHEN 325 MG PO TABS
ORAL_TABLET | ORAL | Status: AC
  Filled 2016-08-06: qty 2

## 2016-08-06 MED ORDER — DOXYCYCLINE HYCLATE 100 MG PO CAPS: 100.0000 mg | ORAL_CAPSULE | Freq: Two times a day (BID) | ORAL | 0 refills | Status: DC

## 2016-08-06 NOTE — ED Provider Notes (Signed)
TIME SEEN: 4:53 AM  CHIEF COMPLAINT: Abdominal pain  HPI: Patient is a 31 year old female with history of gestational diabetes, previous pyelonephritis who is status post cholecystectomy and appendectomy who presents emergency department with continued lower abdominal pain she describes as sharp in nature with intermittent nausea, vomiting and diarrhea. Patient was seen here on July 12 for similar symptoms at that time had a negative CT scan. States that she continues to feel poorly and have fever. Has had diffuse throbbing headache as well but no neck pain or neck stiffness. Is also having body aches. She now endorses vaginal itching and burning but no bleeding or discharge. States she has had an IUD in place for 10 years and is being followed by OB/GYN for this. No history of STDs. She has had 4 previous pregnancies and has 4 children. No tick bite. No sick contact. No recent travel. She has had some dysuria. No cough, chest pain, shortness of breath, sore throat.   Spanish interpreter used throughout history, physical and disposition.  ROS: See HPI Constitutional:  fever  Eyes: no drainage  ENT: no runny nose   Cardiovascular:  no chest pain  Resp: no SOB  GI: no vomiting GU:  dysuria Integumentary: no rash  Allergy: no hives  Musculoskeletal: no leg swelling  Neurological: no slurred speech ROS otherwise negative  PAST MEDICAL HISTORY/PAST SURGICAL HISTORY:  Past Medical History:  Diagnosis Date  . Gestational diabetes    insulin dependent  . Kidney infection 0105/2017    MEDICATIONS:  Prior to Admission medications   Medication Sig Start Date End Date Taking? Authorizing Provider  cephALEXin (KEFLEX) 500 MG capsule Take 1 capsule (500 mg total) by mouth 4 (four) times daily. Patient not taking: Reported on 03/17/2016 01/27/15   Christiane Ha, MD  dicyclomine (BENTYL) 20 MG tablet Take 1 tablet (20 mg total) by mouth 2 (two) times daily. 07/31/16   Long, Arlyss Repress, MD   ipratropium (ATROVENT) 0.06 % nasal spray Place 2 sprays into both nostrils 4 (four) times daily. Patient not taking: Reported on 03/17/2016 07/21/15   Hayden Rasmussen, NP  loperamide (IMODIUM) 2 MG capsule Take 1 capsule (2 mg total) by mouth 4 (four) times daily as needed for diarrhea or loose stools. 07/31/16   Long, Arlyss Repress, MD  ondansetron (ZOFRAN ODT) 4 MG disintegrating tablet Take 1 tablet (4 mg total) by mouth every 8 (eight) hours as needed for nausea or vomiting. 07/31/16   Long, Arlyss Repress, MD  oxyCODONE-acetaminophen (PERCOCET/ROXICET) 5-325 MG per tablet Take 1 tablet by mouth every 4 (four) hours as needed (for pain scale less than 7). Patient not taking: Reported on 03/17/2016 11/23/13   Gwen Pounds, MD    ALLERGIES:  Allergies  Allergen Reactions  . Influenza Vaccines Itching and Rash    SOCIAL HISTORY:  Social History  Substance Use Topics  . Smoking status: Never Smoker  . Smokeless tobacco: Never Used  . Alcohol use No    FAMILY HISTORY: Family History  Problem Relation Age of Onset  . Diabetes Mother   . Heart disease Father   . Mental retardation Sister     EXAM: BP 118/86 (BP Location: Right Arm)   Pulse (!) 127   Temp (!) 101.1 F (38.4 C) (Oral)   Resp 20   Ht 5\' 5"  (1.651 m)   Wt 81.6 kg (180 lb)   LMP 06/06/2016   SpO2 100%   BMI 29.95 kg/m  CONSTITUTIONAL: Alert and oriented and  responds appropriately to questions. Well-appearing; well-nourished; obese, febrile HEAD: Normocephalic EYES: Conjunctivae clear, pupils appear equal, EOMI ENT: normal nose; moist mucous membranes NECK: Supple, no meningismus, no nuchal rigidity, no LAD  CARD: regular and tachycardic; S1 and S2 appreciated; no murmurs, no clicks, no rubs, no gallops RESP: Normal chest excursion without splinting or tachypnea; breath sounds clear and equal bilaterally; no wheezes, no rhonchi, no rales, no hypoxia or respiratory distress, speaking full sentences ABD/GI: Normal bowel sounds;  non-distended; soft, diffusely tender throughout the lower abdomen, no rebound, no guarding, no peritoneal signs, no hepatosplenomegaly GU:  Normal external genitalia. No lesions, rashes noted. Patient has no vaginal bleeding on exam. Thick heel low vaginal discharge coming from the cervical os. IUD strings are noted coming out of the cervical os.  Patient has bilateral adnexal tenderness without mass. She also has cervical motion tenderness and the cervix appears friable. Cervix is closed. Chaperone present for exam. BACK:  The back appears normal and is non-tender to palpation, there is no CVA tenderness EXT: Normal ROM in all joints; non-tender to palpation; no edema; normal capillary refill; no cyanosis, no calf tenderness or swelling    SKIN: Normal color for age and race; warm; no rash NEURO: Moves all extremities equally PSYCH: The patient's mood and manner are appropriate. Grooming and personal hygiene are appropriate.  MEDICAL DECISION MAKING: Patient here with continued fevers, vomiting and diarrhea. Her pelvic exam suggests cervicitis and possible PID. She does appear to have a urinary tract infection today. She's had pyelonephritis before. No flank pain currently. We'll send urine culture and obtain pelvic cultures today. We'll also obtain a transvaginal ultrasound. She had a CT scan of her abdomen and pelvis on July 12 which was unremarkable. I do not feel this needs to be emergently repeated. Her labs are unremarkable other than hyperglycemia without DKA. She states she has had diabetes when she was pregnant in the past. She is not pregnant today. We'll give her IV fluids, subcutaneous insulin. We'll start her on metformin. Patient also complaining of headache but has no meningismus on exam and have very low suspicion for meningitis. Will treat symptoms with Toradol, Zofran and reassess. Patient and family comfortable with this plan.  ED PROGRESS: Patient's transvaginal ultrasound shows no  acute abnormality. There is no abscess. No torsion. Wet prep shows many white blood cells but no yeast, Trichomonas or clue cells. Gonorrhea and Chlamydia pending. She has received Rocephin for UTI as well as STI coverage along with Azithromycin.  I am concerned for PID based on her clinical exam. We have discussed treatment options of leaving the IUD in place with close outpatient OB/GYN follow-up. She states she would feel more comfortable if the IUD was removed. We have discussed at length that she would need to use other contraceptive measures with sexual intercourse at all times in order to avoid pregnancy. She verbalized understanding. She reports headache and abdominal pain almost completely gone after IV Toradol.   Patient will be discharged with 2 weeks of doxycycline, one week of Keflex, instructions to alternate Tylenol and Motrin for fever and pain. She has prescriptions for Zofran, Imodium and Bentyl at have been filled. She will follow-up closely with OB/GYN. She will use condoms for contraception.  She will be discharged with metformin 500 mg twice a day to take for her elevated blood sugars and she will be instructed to follow-up with her primary care provider.    Blood sugar has improved to 61. I feel  she is safe to be discharged. Again she is not in DKA.   At this time, I do not feel there is any life-threatening condition present. I have reviewed and discussed all results (EKG, imaging, lab, urine as appropriate) and exam findings with patient/family. I have reviewed nursing notes and appropriate previous records.  I feel the patient is safe to be discharged home without further emergent workup and can continue workup as an outpatient as needed. Discussed usual and customary return precautions. Patient/family verbalize understanding and are comfortable with this plan.  Outpatient follow-up has been provided if needed. All questions have been answered.     PROCEDURE: Foreign Body  Removal Date/Time: 08/06/2016 8:05 AM Performed by: Raelyn NumberWARD, Baldemar Dady N Authorized by: Raelyn NumberWARD, Ladanian Kelter N  Consent: Verbal consent obtained. Consent given by: patient Patient understanding: patient states understanding of the procedure being performed Imaging studies: imaging studies available Required items: required blood products, implants, devices, and special equipment available Patient identity confirmed: verbally with patient Time out: Immediately prior to procedure a "time out" was called to verify the correct patient, procedure, equipment, support staff and site/side marked as required. Body area: vagina  Sedation: Patient sedated: no Patient restrained: no Patient cooperative: yes Removal mechanism: ring forceps Complexity: simple 1 objects recovered. Objects recovered: IUD Post-procedure assessment: foreign body removed Patient tolerance: Patient tolerated the procedure well with no immediate complications       Lesleyann Fichter, Layla MawKristen N, DO 08/06/16 [id number]

## 2016-08-06 NOTE — ED Notes (Signed)
Spanish interpretor used to go over pts discharge instructions. Pt verbalized understanding.

## 2016-08-06 NOTE — ED Triage Notes (Signed)
Pt c/o abdominal pain, n/v/d, a headache and fever. Pt states she has vomited 10 times in last 24 hours. Also c/o vaginal burning and itching.

## 2016-08-06 NOTE — ED Notes (Signed)
ED Provider at bedside. 

## 2016-08-06 NOTE — ED Notes (Addendum)
Dr. Elesa MassedWard removed IUD easily with ring forceps pt tolerated well.

## 2016-08-06 NOTE — ED Notes (Signed)
Patient transported to Ultrasound 

## 2016-08-06 NOTE — Discharge Instructions (Signed)
You may alternate Tylenol 1000 mg every 6 hours as needed for pain and fever and Ibuprofen 800 mg every 8 hours as needed for pain and fever.  Please take Ibuprofen with food.  Please use condoms with sexual intercourse to avoid pregnancy as we removed your IUD today. Please follow-up closely with your OB/GYN for close recheck.  Please take metformin for your elevated blood sugar and follow-up closely with a primary care physician for this.    [facility] [email or web address] [address] [place], [place] [phone]) [phone]    [facility] [email or web address] [address] [place], [place] [phone]    [facility] [address] [place], [place] [address](336) [phone]   [facility] [email or web address] [address] [place], [place] [phone](336) [phone]   [facility] [email or web address] [address] [place], [place] [phone]) [phone]   [email or web address] [address] [place], [place] [phone]) [phone]   To find a primary care or specialty doctor please call [phone] or [phone] to access "[facility] Find a Doctor Service."  You may also go on the [facility] website at [email or web address]  There are also multiple [facility], [facility], [facility] and [facility] practices throughout [place] that are frequently accepting new patients. You may find a clinic that is close to your home and contact them.  [facility] -  [address] [place] [phone] [phone]   [facility] -  [address] [place] [address] [phone]   [facility] [phone]  [place] [phone]

## 2016-08-08 LAB — URINE CULTURE: Culture: >=100000 — AB

## 2016-08-09 ENCOUNTER — Telehealth: Payer: Self-pay

## 2016-08-09 NOTE — Telephone Encounter (Signed)
Post ED Visit - Positive Culture Follow-up  Culture report reviewed by antimicrobial stewardship pharmacist:  []  Enzo BiNathan Batchelder, Pharm.D. []  Celedonio MiyamotoJeremy Frens, Pharm.D., BCPS AQ-ID [x]  Garvin FilaMike Maccia, Pharm.D., BCPS []  Georgina PillionElizabeth Martin, Pharm.D., BCPS []  VictorMinh Pham, [place]arm.D., BCPS, AAHIVP []  Estella HuskMichelle Turner, Pharm.D., BCPS, AAHIVP []  Lysle Pearlachel Rumbarger, PharmD, BCPS []  Casilda Carlsaylor Stone, PharmD, BCPS []  Pollyann SamplesAndy Johnston, PharmD, BCPS  Positive urine culture Treated with Cephalexin, organism sensitive to the same and no further patient follow-up is required at this time.  Jerry CarasCullom, Margretta Zamorano Burnett 08/09/2016, 11:31 AM

## 2016-11-24 ENCOUNTER — Ambulatory Visit (INDEPENDENT_AMBULATORY_CARE_PROVIDER_SITE_OTHER): Payer: Worker's Compensation | Admitting: Family Medicine

## 2016-11-24 ENCOUNTER — Ambulatory Visit (INDEPENDENT_AMBULATORY_CARE_PROVIDER_SITE_OTHER): Payer: Self-pay

## 2016-11-24 DIAGNOSIS — S39012A Strain of muscle, fascia and tendon of lower back, initial encounter: Secondary | ICD-10-CM

## 2016-11-24 DIAGNOSIS — M62830 Muscle spasm of back: Secondary | ICD-10-CM | POA: Diagnosis not present

## 2016-11-24 MED ORDER — NAPROXEN 500 MG PO TABS: 500.0000 mg | ORAL_TABLET | Freq: Two times a day (BID) | ORAL | 0 refills | Status: DC

## 2016-11-24 MED ORDER — PREDNISONE 10 MG (21) PO TBPK: ORAL_TABLET | ORAL | 0 refills | Status: DC

## 2016-11-24 MED ORDER — KETOROLAC TROMETHAMINE 60 MG/2ML IM SOLN
60.0000 mg | Freq: Once | INTRAMUSCULAR | Status: AC
  Administered 2016-11-24: 60 mg via INTRAMUSCULAR

## 2016-11-24 MED ORDER — CYCLOBENZAPRINE HCL 5 MG PO TABS: 5.0000 mg | ORAL_TABLET | Freq: Three times a day (TID) | ORAL | 1 refills | Status: DC | PRN

## 2016-11-24 NOTE — Patient Instructions (Addendum)
   IF you received an x-ray today, you will receive an invoice from [facility]. Please contact [facility] at [phone] with questions or concerns regarding your invoice.   IF you received labwork today, you will receive an invoice from [facility]. Please contact [facility] at [phone] with questions or concerns regarding your invoice.   Our billing staff will not be able to assist you with questions regarding bills from these companies.  You will be contacted with the lab results as soon as they are available. The fastest way to get your results is to activate your My Chart account. Instructions are located on the last page of this paperwork. If you have not heard from us regarding the results in 2 weeks, please contact this office.     Distensin lumbar con rehabilitacin (Low Back Strain With Rehab) Una distensin es un estiramiento o un desgarro en un msculo o los fuertes cordones de tejido que adhieren el msculo al hueso (tendones). Las distensiones en la parte inferior de la espalda (columna lumbar) son una causa frecuente de dolor lumbar. Una distensin ocurre cuando los msculos o tendones se desgarran o se estiran ms all de su lmite. Los msculos se pueden inflamar y, por lo tanto, provocar dolor y contraccin repentina del msculo (espasmos). Una distensin puede ocurrir de repente debido a una lesin (traumatismo) o se puede desarrollar gradualmente debido al uso excesivo. Hay tres tipos de distensiones:  El grado1 es una distensin leve que se caracteriza por un desgarro menor de las fibras o tendones musculares. Esto puede provocar un poco de dolor, pero no hay prdida de fuerza muscular.  El grado2 es una distensin moderada producida por un desgarro parcial de las fibras o tendones musculares. Esto provoca un dolor ms intenso y cierta prdida de fuerza muscular.  El grado3 es una distensin grave e implica la ruptura completa del msculo o del  tendn. Esto causa un dolor intenso y la prdida completa o casi completa de la fuerza muscular. CAUSAS Esta afeccin puede ser causada por lo siguiente:  Traumatismo, debido, por ejemplo, a una cada o a un golpe en el cuerpo.  Torsin o hiperdistensin de la espalda. Esto puede ser el resultado de realizar actividades que requieren mucha energa, como levantar objetos pesados. FACTORES DE RIESGO Los siguientes factores pueden aumentar el riesgo de sufrir esta afeccin:  Practicar deportes de contacto.  Participar en deportes o actividades que sobrecargan la espalda e implican mucha flexin y torsin, por ejemplo: ? Levantar pesas u objetos pesados. ? Gimnasia. ? Ftbol. ? Patinaje artstico. ? Snowboard.  Tener exceso de peso u obesidad.  Tener poca fuerza y flexibilidad. SNTOMAS Los sntomas de esta afeccin pueden incluir los siguientes:  Dolor agudo o sordo en la parte inferior de la espalda que no desaparece. El dolor se puede extender hacia las nalgas.  Rigidez.  Amplitud de movimientos limitada.  Incapacidad para pararse derecho debido a la rigidez o al dolor.  Espasmos musculares. DIAGNSTICO Esta afeccin se puede diagnosticar en funcin de lo siguiente:  Sus sntomas.  Sus antecedentes mdicos.  Un examen fsico. ? El mdico puede presionar sobre ciertas zonas de la espalda para determinar el origen de su dolor. ? Le puede pedir que se incline hacia adelante, hacia atrs y de un lado al otro para evaluar la intensidad del dolor y la amplitud de movimiento.  Pruebas de diagnstico por imgenes, como: ? Radiografas. ? Resonancia magntica (RM). TRATAMIENTO El tratamiento de esta afeccin puede incluir   lo siguiente:  Aplicar calor y fro en la zona afectada.  Tomar medicamentos para aliviar el dolor y relajar los msculos (relajantes musculares).  Tomar antiinflamatorios no esteroides (AINE) para ayudar a reducir la hinchazn y las  molestias.  Realizar fisioterapia. Cuando los sntomas mejoran, es importante volver a su rutina habitual tan pronto como sea posible para reducir el dolor, y evitar la rigidez y la prdida de fuerza muscular. En general, los sntomas deberan mejorar en 6semanas de tratamiento. Sin embargo, el tiempo de recuperacin vara. INSTRUCCIONES PARA EL CUIDADO EN EL HOGAR Control del dolor, de la rigidez y de la hinchazn  Si se lo indic el mdico, aplique hielo en la zona afectada durante las primeras 24horas despus de la lesin. ? Ponga el hielo en una bolsa plstica. ? Coloque una toalla entre la piel y la bolsa de hielo. ? Coloque el hielo durante 20minutos, 2 a 3veces por da.  Si se lo indican, aplique calor en la zona afectada tan frecuentemente como se lo haya indicado el mdico. Use la fuente de calor que el mdico le recomiende, como una compresa de calor hmedo o una almohadilla trmica. ? Coloque una toalla entre la piel y la fuente de calor. ? Aplique el calor durante 20 a 30minutos. ? Retire la fuente de calor si la piel se le pone de color rojo brillante. Esto es muy importante si no puede sentir el dolor, el calor o el fro. Puede correr un riesgo mayor de sufrir quemaduras. Actividad  Descanse y retome sus actividades normales como se lo haya indicado el mdico. Pregntele al mdico qu actividades son seguras para usted.  Evite las actividades que demandan mucho esfuerzo (que son extenuantes) durante el tiempo que le haya indicado el mdico.  Haga ejercicios como se lo haya indicado el mdico. Instrucciones generales  Tome los medicamentos de venta libre y los recetados solamente como se lo haya indicado el mdico.  Si tiene alguna pregunta o inquietud sobre la seguridad mientras toma analgsicos, hable con el mdico.  No conduzca ni opere maquinaria pesada hasta saber cmo lo afectan los analgsicos.  No consuma ningn producto que contenga tabaco, lo que incluye  cigarrillos, tabaco de mascar y cigarrillos electrnicos. El tabaco puede retrasar el proceso de curacin. Si necesita ayuda para dejar de fumar, consulte al mdico.  Concurra a todas las visitas de control como se lo haya indicado el mdico. Esto es importante. PREVENCIN  Precaliente y elongue adecuadamente antes de la actividad.  Reljese y elongue despus de realizar una actividad.  Dele a su cuerpo tiempo para descansar entre los perodos de actividad.  Evite lo siguiente: ? Estar fsicamente inactivo durante perodos prolongados. ? Hacer ejercicio o practicar deportes cuando est cansado o dolorido.  Practique deportes y levante objetos pesados de la forma correcta.  Adopte una buena postura mientras est sentado y de pie.  Mantenga un peso saludable.  Duerma en un colchn de firmeza media para apoyar la columna.  Asegrese de utilizar un equipo apto para usted, incluidos zapatos que calcen bien.  Tome medidas de seguridad y sea responsable al hacer una actividad, para evitar las cadas.  Haga por lo menos 150minutos de ejercicios de intensidad moderada cada semana, como caminar a paso ligero o hacer gimnasia acutica. Pruebe alguna forma de ejercicio que le quite tensin de la espalda, como nadar o utilizar la bicicleta fija.  Mantenga un buen estado fsico, esto incluye lo siguiente: ? La fuerza. ? La flexibilidad. ? La   capacidad cardiovascular. ? La resistencia.  SOLICITE ATENCIN MDICA SI:  El dolor de espalda no mejora despus de 6semanas de tratamiento.  Los sntomas empeoran.  SOLICITE ATENCIN MDICA DE INMEDIATO SI:  El dolor de espalda es muy intenso.  Nota que no puede pararse o caminar.  Siente dolor en las piernas.  Siente debilidad en las nalgas o en las piernas.  Tiene problemas para controlar la miccin o los movimientos intestinales.  Esta informacin no tiene como fin reemplazar el consejo del mdico. Asegrese de hacerle al mdico  cualquier pregunta que tenga. Document Released: 10/23/2005 Document Revised: 05/23/2014 Document Reviewed: 10/18/2014 Elsevier Interactive Patient Education  2017 Elsevier Inc.  

## 2016-11-24 NOTE — Progress Notes (Signed)
Chief Complaint  Patient presents with  . workman's comp    initial visit, back pain, onset today while lifting at work(crate of lettuce on high shelf) picked up started to feel pain in back and has gotten worse over time    HPI  Date of Injury: 11/24/2016 at 8:40am Patient works in the prep area of [employer] She reports that she was reaching overhead to take romaine lettuce off the shelf She reports that a box of lettuce fell and she tried to catch it but couldn't. After if fell she bent to pick it up and had severe pain in her mid low back  She reports that the pain was about 8/10 She denies loss of urine or radiating buttock pain She states that she took ibuprofen this morning which helped slightly.  She cannot twist to change position in her seat.     PMH reviewed in chart  Current medications reviewed and in chart  Allergies:  Reviewed and in chart  Past Surgical History - reviewed and in chart  Social History reviewed and in chart  Family History reviewed and in chart  ROS Review of Systems See HPI  Objective: There were no vitals filed for this visit.  Physical Exam  Constitutional: She is oriented to person, place, and time. She appears well-developed and well-nourished.  HENT:  Head: Normocephalic and atraumatic.  Eyes: Conjunctivae and EOM are normal.  Neck: Normal range of motion.  Cardiovascular: Normal rate, regular rhythm and normal heart sounds.  No murmur heard. Pulmonary/Chest: Effort normal and breath sounds normal. No stridor. No respiratory distress.  Abdominal: Soft. Bowel sounds are normal. She exhibits no distension. There is no tenderness.  Musculoskeletal:       Lumbar back: She exhibits decreased range of motion, tenderness, bony tenderness, pain and spasm. She exhibits no swelling, no edema, no deformity, no laceration and normal pulse.       Back:  Neurological: She is alert and oriented to person, place, and time.  Skin: Skin is  warm. Capillary refill takes less than 2 seconds.  Psychiatric: She has a normal mood and affect. Her behavior is normal. Judgment and thought content normal.    Assessment and Plan Imo was seen today for workman's comp.  Diagnoses and all orders for this visit:  Muscle spasm of back -     ketorolac (TORADOL) injection 60 mg -     DG Lumbar Spine 2-3 Views -     predniSONE (STERAPRED UNI-PAK 21 TAB) 10 MG (21) TBPK tablet; Take 6 tabs on day 1, 5 tabs on day 2, 4 tabs on day 3, 3 tabs on day 4, 2 tabs on day 5, 1 tab on day 6 -     cyclobenzaprine (FLEXERIL) 5 MG tablet; Take 1 tablet (5 mg total) 3 (three) times daily as needed by mouth for muscle spasms. -     naproxen (NAPROSYN) 500 MG tablet; Take 1 tablet (500 mg total) 2 (two) times daily with a meal by mouth.  Strain of lumbar region, initial encounter -     ketorolac (TORADOL) injection 60 mg -     DG Lumbar Spine 2-3 Views -     predniSONE (STERAPRED UNI-PAK 21 TAB) 10 MG (21) TBPK tablet; Take 6 tabs on day 1, 5 tabs on day 2, 4 tabs on day 3, 3 tabs on day 4, 2 tabs on day 5, 1 tab on day 6 -     cyclobenzaprine (FLEXERIL)  5 MG tablet; Take 1 tablet (5 mg total) 3 (three) times daily as needed by mouth for muscle spasms. -     naproxen (NAPROSYN) 500 MG tablet; Take 1 tablet (500 mg total) 2 (two) times daily with a meal by mouth.   Gave work restrictions Nsaids, ice, muscle relaxer Avoid lifting, bending, turning, or twisting Follow up in one week   Cybil [place] A [employer]

## 2016-12-01 ENCOUNTER — Ambulatory Visit (INDEPENDENT_AMBULATORY_CARE_PROVIDER_SITE_OTHER): Payer: Worker's Compensation | Admitting: Family Medicine

## 2016-12-01 ENCOUNTER — Other Ambulatory Visit: Payer: Self-pay

## 2016-12-01 ENCOUNTER — Encounter: Payer: Self-pay | Admitting: Family Medicine

## 2016-12-01 VITALS — BP 126/84 | HR 90 | Temp 98.9°F | Ht 62.21 in | Wt 179.6 lb

## 2016-12-01 DIAGNOSIS — S39012D Strain of muscle, fascia and tendon of lower back, subsequent encounter: Secondary | ICD-10-CM

## 2016-12-01 DIAGNOSIS — M62830 Muscle spasm of back: Secondary | ICD-10-CM

## 2016-12-01 DIAGNOSIS — Y990 Civilian activity done for income or pay: Secondary | ICD-10-CM

## 2016-12-01 NOTE — Progress Notes (Signed)
Chief Complaint  Patient presents with  . Follow-up    w/c back injury.  Back pain with sitting with numbness and tingling.  Per pt she is unable to p/u her  31 yr old due to the pain in her lower back, unable to sleep on her left side due to pain.  Pt taking otc tylenol 500 mg bid during the day and at night if the pain is severe enough.    HPI [employer] translator ID [id number] [id number]  Work related injury Pt reports that she has been having continued pain She has to sleep on the right since the left side hurts She would rate her pain as a 4/10 She reports that she is taking the flexeril and naproxen She has pain when she stretches her torso that also feels like tingling pain like pins and needles She reports that she is also taking tylenol 1000mg  She gets pain when she twists to the left side because that is where she hurt herself. She only has a little bit of pain in her low back when she bends and picks up    PMH reviewed in the chart    Current Outpatient Medications  Medication Sig Dispense Refill  . cyclobenzaprine (FLEXERIL) 5 MG tablet Take 1 tablet (5 mg total) 3 (three) times daily as needed by mouth for muscle spasms. 30 tablet 1  . naproxen (NAPROSYN) 500 MG tablet Take 1 tablet (500 mg total) 2 (two) times daily with a meal by mouth. 30 tablet 0  . predniSONE (STERAPRED UNI-PAK 21 TAB) 10 MG (21) TBPK tablet Take 6 tabs on day 1, 5 tabs on day 2, 4 tabs on day 3, 3 tabs on day 4, 2 tabs on day 5, 1 tab on day 6 21 tablet 0   No current facility-administered medications for this visit.     Allergies: reviewed and in the chart  Allergies  Allergen Reactions  . Influenza Vaccines Itching and Rash   Past Surgical History reviewed in the chart  Social History - reviewed in the chart  Family History reviewed in the chart      ROS Review of Systems See HPI Constitution: No fevers or chills No malaise No diaphoresis Skin: No rash or itching Eyes: no blurry vision, no  double vision GU: no dysuria or hematuria Neuro: numbness and tingling in her back     Objective: Vitals:   12/01/16 1027  BP: 126/84  Pulse: 90  Temp: 98.9 F (37.2 C)  TempSrc: Oral  Weight: 179 lb 9.6 oz (81.5 kg)  Height: 5' 2.21" (1.58 m)    Physical Exam  Constitutional: She is oriented to person, place, and time. She appears well-developed and well-nourished.  HENT:  Head: Normocephalic and atraumatic.  Eyes: Conjunctivae and EOM are normal.  Cardiovascular: Normal rate, regular rhythm and normal heart sounds.  Pulmonary/Chest: Effort normal and breath sounds normal. No stridor. No respiratory distress.  Musculoskeletal:       Lumbar back: She exhibits tenderness and spasm. She exhibits normal range of motion, no bony tenderness, no swelling, no edema, no deformity, no laceration, no pain and normal pulse.       Back:  Neurological: She is alert and oriented to person, place, and time.  Reflex Scores:      Patellar reflexes are 2+ on the right side and 2+ on the left side. Psychiatric: She has a normal mood and affect. Her behavior is normal. Judgment and thought content normal.    Assessment  and Plan Martha [name] was seen today for follow-up.  Diagnoses and all orders for this visit:  Work related injury -     Ambulatory referral to Physical Therapy  Strain of lumbar region, subsequent encounter -     Ambulatory referral to Physical Therapy  Muscle spasm of back -     Ambulatory referral to Physical Therapy  Other orders -     Cancel: Flu Vaccine QUAD 36+ mos IM -     Cancel: POCT glycosylated hemoglobin (Hb A1C) -     Cancel: Microalbumin, urine -     Cancel: Pneumococcal conjugate vaccine 13-valent IM   Patient should follow up with Physical therapy for her low back strain She should continue flexeril and naproxen Advised adding aspercreme with lidocaine for topical relief Gave work restrictions to avoid bending and reaching overhead as well as  lifting She should follow up in one week  Martha [name]

## 2016-12-01 NOTE — Patient Instructions (Addendum)
Aspercreme with Lidocaine Apply to your back at night to help with your pain  aplicar Crema a la espalda para ayudar con el dolor   IF you received an x-ray today, you will receive an [facility]. Please contact [facility] at [phone] with questions or concerns regarding your invoice.   IF you received labwork today, you will receive an invoice from [facility]. Please contact [facility] at [phone] with questions or concerns regarding your invoice.   Our billing staff will not be able to assist you with questions regarding bills from these companies.  You will be contacted with the lab results as soon as they are available. The fastest way to get your results is to activate your My Chart account. Instructions are located on the last page of this paperwork. If you have not heard from us regarding the results in 2 weeks, please contact this office.

## 2016-12-02 IMAGING — CR DG CHEST 2V
2 series · 2 of 2 positions shown · non-contrast
Comparison: None.

CLINICAL DATA: RIGHT lower quadrant pain and fever

EXAM:
CHEST  2 VIEW

[chest pa]
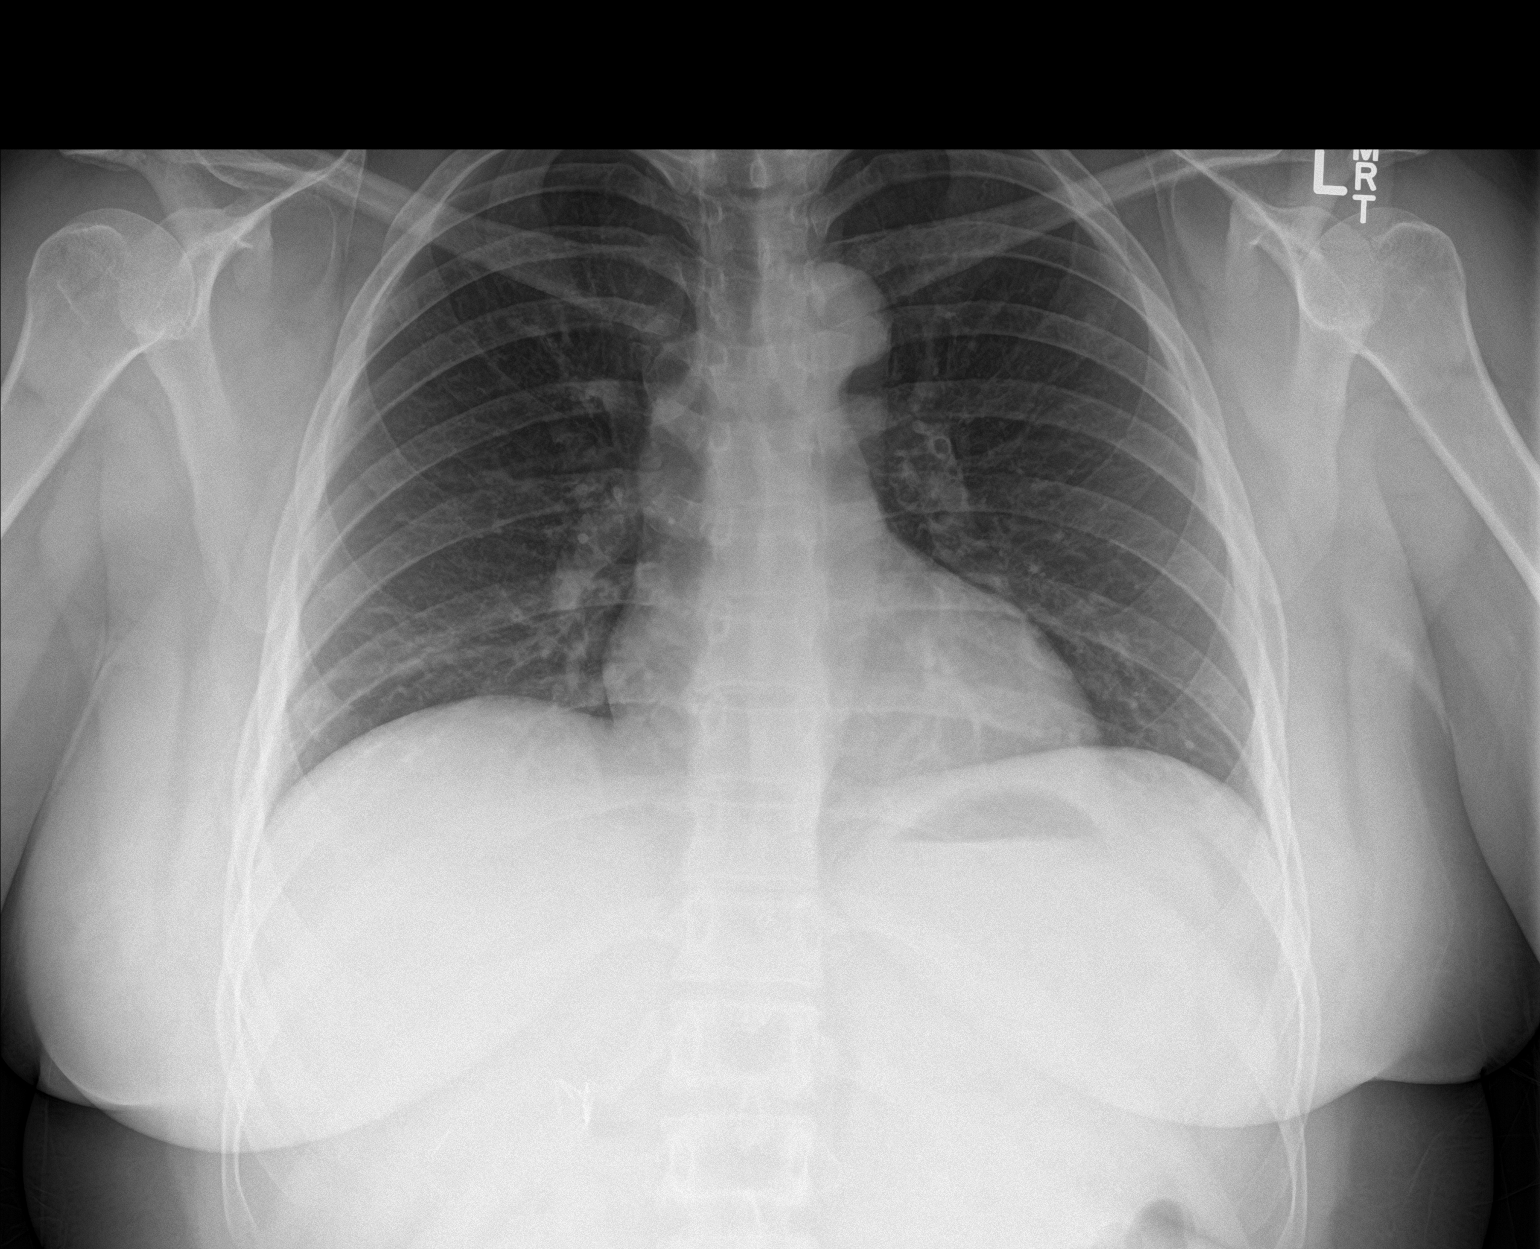

[chest lat]
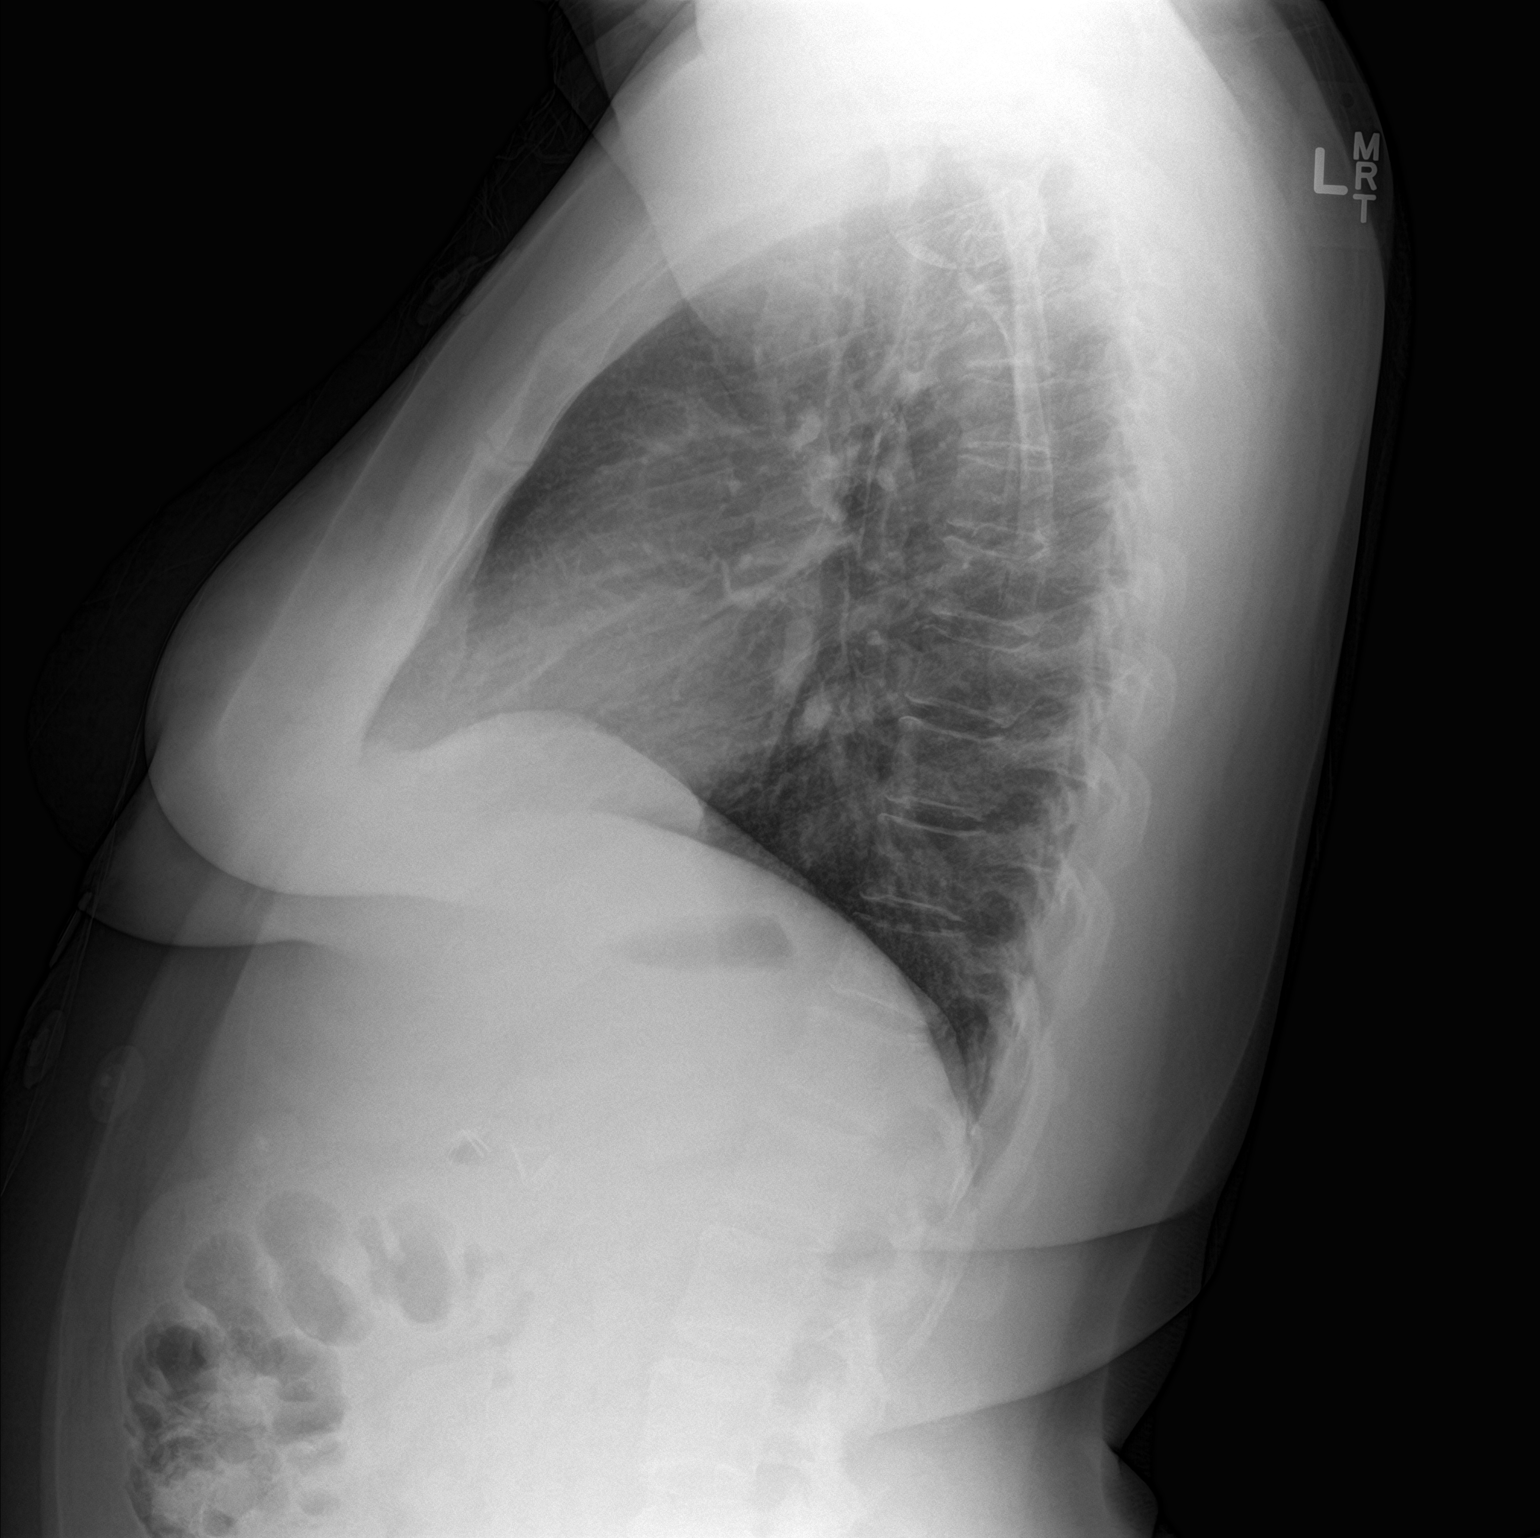

[2 of 2 positions shown; findings below may reference images not displayed]

FINDINGS: Normal mediastinum and cardiac silhouette. Normal pulmonary
vasculature. No evidence of effusion, infiltrate, or pneumothorax.
No acute bony abnormality. Post cholecystectomy.
IMPRESSION: Normal chest radiograph

## 2016-12-04 ENCOUNTER — Telehealth: Payer: Self-pay | Admitting: Family Medicine

## 2016-12-04 NOTE — Telephone Encounter (Signed)
Copied from CRM [id number]. Topic: Medical Record Request - Provider/Facility Request >> Dec 04, 2016  4:24 PM Alexander BergeronBarksdale, Harvey B wrote: Reason for CRM: Be on the lookout for the Employer/Company [facility] [employer]) to send over permission of medical records of visits from 11.5.18 of pt to file the W/C claim, the docs will be addressed to Dr. Creta LevinStallings

## 2016-12-05 NOTE — Telephone Encounter (Signed)
Nothing received as of yet--she will ned to ask them to fax it again

## 2016-12-10 ENCOUNTER — Other Ambulatory Visit: Payer: Self-pay

## 2016-12-10 ENCOUNTER — Encounter: Payer: Self-pay | Admitting: Family Medicine

## 2016-12-10 ENCOUNTER — Ambulatory Visit (INDEPENDENT_AMBULATORY_CARE_PROVIDER_SITE_OTHER): Payer: Worker's Compensation | Admitting: Family Medicine

## 2016-12-10 VITALS — BP 130/80 | HR 100 | Temp 98.6°F | Ht 61.02 in | Wt 183.4 lb

## 2016-12-10 DIAGNOSIS — M62830 Muscle spasm of back: Secondary | ICD-10-CM | POA: Diagnosis not present

## 2016-12-10 DIAGNOSIS — S39012D Strain of muscle, fascia and tendon of lower back, subsequent encounter: Secondary | ICD-10-CM

## 2016-12-10 DIAGNOSIS — S39012A Strain of muscle, fascia and tendon of lower back, initial encounter: Secondary | ICD-10-CM | POA: Diagnosis not present

## 2016-12-10 DIAGNOSIS — Y990 Civilian activity done for income or pay: Secondary | ICD-10-CM

## 2016-12-10 MED ORDER — CYCLOBENZAPRINE HCL 5 MG PO TABS: 5.0000 mg | ORAL_TABLET | Freq: Three times a day (TID) | ORAL | 1 refills | Status: DC | PRN

## 2016-12-10 MED ORDER — NAPROXEN 500 MG PO TABS: 500.0000 mg | ORAL_TABLET | Freq: Two times a day (BID) | ORAL | 1 refills | Status: DC

## 2016-12-10 NOTE — Patient Instructions (Addendum)
[address]  [address], [address] [place] [address] Phone [phone]  Your appointment for Physical Therapy is for Tuesday 12/16/2016 at 1:30pm but arrive at 1:00pm    Follow up in 2 weeks to evaluate your progress with physical therapy.  IF you received an x-ray today, you will receive an invoice from [facility]. Please contact [facility] at [phone] with questions or concerns regarding your invoice.   IF you received labwork today, you will receive an [facility] from [facility]. Please contact [facility] at [phone] with questions or concerns regarding your invoice.   Our billing staff will not be able to assist you with questions regarding bills from these companies.  You will be contacted with the lab results as soon as they are available. The fastest way to get your results is to activate your My Chart account. Instructions are located on the last page of this paperwork. If you have not heard from us regarding the results in 2 weeks, please contact this office.

## 2016-12-10 NOTE — Telephone Encounter (Signed)
Still no forms for this patient---im going to close the encounter

## 2016-12-10 NOTE — Progress Notes (Signed)
  Chief Complaint  Patient presents with  . 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 Pt was referred to PT and has not gone back to work She reports that she has not received physical therapy She reports that she is still waiting and has not been back to work She continues to have pain with twisting, lifting, bending turning  She would rate her pain 5/10 in her low back She is taking naproxen and flexeril She also bought a back brace which helps    PMH reviewed in chart Outpatient meds reviewed in chart Allergies reviewed and in chart Past surgical history reviewed and in chart Social History reviewed in chart Family History used in chart   ROS Review of Systems See HPI Constitution: No fevers or chills No malaise No diaphoresis Skin: No rash or itching Eyes: no blurry vision, no double vision GU: no dysuria or hematuria Neuro: no dizziness or headaches * all others reviewed and negative   Objective: Vitals:   12/10/16 1348  BP: 130/80  Pulse: 100  Temp: 98.6 F (37 C)  SpO2: 97%  Weight: 183 lb 6.4 oz (83.2 kg)  Height: 5' 1.02" (1.55 m)    Physical Exam  Constitutional: She appears well-developed and well-nourished.  HENT:  Head: Normocephalic and atraumatic.  Eyes: Conjunctivae and EOM are normal.  Cardiovascular: Normal rate, regular rhythm and normal heart sounds.  Pulmonary/Chest: Effort normal and breath sounds normal. No stridor. No respiratory distress.  Abdominal: Soft. Bowel sounds are normal. She exhibits no distension. There is no tenderness.  Musculoskeletal:       Lumbar back: She exhibits decreased range of motion, tenderness, bony tenderness, pain and spasm. She exhibits no swelling, no edema, no deformity, no laceration and normal pulse.  Skin: Skin is warm. Capillary refill takes less than 2 seconds.  Psychiatric: She has a normal mood and affect. Her behavior is normal. Judgment and thought content normal.    Assessment  and Plan Martha FritzBlanca was seen today for back pain.  Diagnoses and all orders for this visit:  Work related injury-  Revised work note and extended   Strain of lumbar region, subsequent encounter  Muscle spasm of back- refilled meds -     cyclobenzaprine (FLEXERIL) 5 MG tablet; Take 1 tablet (5 mg total) by mouth 3 (three) times daily as needed for muscle spasms. -     naproxen (NAPROSYN) 500 MG tablet; Take 1 tablet (500 mg total) by mouth 2 (two) times daily with a meal.  Strain of lumbar region, initial encounter -     cyclobenzaprine (FLEXERIL) 5 MG tablet; Take 1 tablet (5 mg total) by mouth 3 (three) times daily as needed for muscle spasms. -     naproxen (NAPROSYN) 500 MG tablet; Take 1 tablet (500 mg total) by mouth 2 (two) times daily with a meal.  Continue with physical therapy   Britany Callicott A Creta LevinStallings

## 2016-12-29 ENCOUNTER — Ambulatory Visit: Payer: Self-pay | Admitting: Family Medicine

## 2016-12-31 NOTE — Progress Notes (Signed)
Chief Complaint  Patient presents with  . 2 week f/u workman's comp    pain has increased and PT per pt seems to make pain worse.  Pain 7/10, worse at night and after therapy, lots of pain in lower back.    HPI   Date of Injury 11/24/2016  Pt reports that she has gone to physical therapy and the physical therapy is making things worse She had therapy today She states that her pain is 7/10 She states that she has been having low back pain that radiates down her leg and does not allow her to sleep at night She reports numbness down her left leg  She states that she feels some shooting pains and cramping She states that her low back also feels numb She states that she feels numbness over her tailbone She reports that she has been having insensible loss of urine She also has left leg pain with intercourse which only started as a result of her back injury.  PMH reviewed and in chart  Current Outpatient Medications - reviewed and in chart  Allergies - reviewed and in chart  Past Surgical History - reviewed and in chart  Social History -  Reviewed and in chart  Family History - reviewed and in chart     ROS Review of Systems See HPI Constitution: No fevers or chills No malaise No diaphoresis Skin: No rash or itching Eyes: no blurry vision, no double vision GU: no dysuria or hematuria Neuro: no dizziness or headaches, see hpi  all others reviewed and negative   Objective: Vitals:   01/01/17 1435  BP: 108/70  Pulse: 99  Resp: 16  SpO2: 96%  Weight: 181 lb 12.8 oz (82.5 kg)  Height: 5' 1.02" (1.55 m)    Physical Exam  Constitutional: She is oriented to person, place, and time. She appears well-developed and well-nourished.  HENT:  Head: Normocephalic and atraumatic.  Eyes: Conjunctivae and EOM are normal.  Cardiovascular: Normal rate, regular rhythm and normal heart sounds.  No murmur heard. Pulmonary/Chest: Effort normal and breath sounds normal. No stridor.  No respiratory distress.  Neurological: She is alert and oriented to person, place, and time. She has normal strength. She displays no atrophy and no tremor. She exhibits normal muscle tone.  Reflex Scores:      Bicep reflexes are 2+ on the right side and 2+ on the left side.      Brachioradialis reflexes are 2+ on the right side and 2+ on the left side.      Patellar reflexes are 1+ on the right side and 2+ on the left side.   Assessment and Plan Elane FritzBlanca was seen today for 2 week f/u workman's comp.  Diagnoses and all orders for this visit:  Work related injury  Strain of lumbar region, subsequent encounter -     MR Lumbar Spine Wo Contrast; Future -     Ambulatory referral to Spine Surgery  Sciatica of left side -     MR Lumbar Spine Wo Contrast; Future -     Ambulatory referral to Spine Surgery  Urinary incontinence without sensory awareness -     MR Lumbar Spine Wo Contrast; Future -     Ambulatory referral to Spine Surgery  Urinary incontinence, unspecified type -     Ambulatory referral to Spine Surgery  Other orders -     gabapentin (NEURONTIN) 100 MG capsule; Take one capsule on night 1, then 2 capsules on night 2, then  3 capsules on night 3 -     gabapentin (NEURONTIN) 300 MG capsule; Take 1 capsule (300 mg total) by mouth at bedtime.   Medical decision making Reduced reflex on left with sciatica as well  Pain unimproved after 6 rounds of PT Worsening pain Will send for MRI and refer to Ortho Spine  Dashiel Bergquist A Creta LevinStallings

## 2017-01-01 ENCOUNTER — Ambulatory Visit (INDEPENDENT_AMBULATORY_CARE_PROVIDER_SITE_OTHER): Payer: Worker's Compensation | Admitting: Family Medicine

## 2017-01-01 ENCOUNTER — Other Ambulatory Visit: Payer: Self-pay

## 2017-01-01 VITALS — BP 108/70 | HR 99 | Resp 16 | Ht 61.02 in | Wt 181.8 lb

## 2017-01-01 DIAGNOSIS — M5432 Sciatica, left side: Secondary | ICD-10-CM

## 2017-01-01 DIAGNOSIS — S39012D Strain of muscle, fascia and tendon of lower back, subsequent encounter: Secondary | ICD-10-CM | POA: Diagnosis not present

## 2017-01-01 DIAGNOSIS — N3942 Incontinence without sensory awareness: Secondary | ICD-10-CM

## 2017-01-01 DIAGNOSIS — R32 Unspecified urinary incontinence: Secondary | ICD-10-CM | POA: Diagnosis not present

## 2017-01-01 DIAGNOSIS — Y990 Civilian activity done for income or pay: Secondary | ICD-10-CM

## 2017-01-01 MED ORDER — GABAPENTIN 100 MG PO CAPS: ORAL_CAPSULE | ORAL | 0 refills | Status: DC

## 2017-01-01 MED ORDER — GABAPENTIN 300 MG PO CAPS: 300.0000 mg | ORAL_CAPSULE | Freq: Every day | ORAL | 0 refills | Status: DC

## 2017-01-01 NOTE — Patient Instructions (Addendum)
Please start taking gabapentin 1 capsule at bedtime on night 1 On night 2 increase to 2 capsules On night 3 increase to 3 capsules On night 4 start the new dose of gabapentin 300mg  one capsule every  Night   Citica (Sciatica) La citica es Chief Technology Officerel dolor, entumecimiento, debilidad u hormigueo a lo largo del nervio citico. El nervio citico comienza en la parte inferior de la espalda y desciende por la parte posterior de cada pierna. Controla los msculos en la parte inferior de las piernas y en la parte posterior de las rodillas. Tambin otorga sensibilidad a la parte posterior de los muslos, la parte inferior de las piernas y la planta de los pies. La citica es un sntoma de otra afeccin que ejerce presin o "pellizca" el nervio citico. Generalmente la citica afecta slo un lado del cuerpo. Suele desaparecer por s sola o con tratamiento. En algunos casos, la Hydrologistcitica puede volver a Research officer, trade unionaparecer . CAUSAS Esta afeccin causa presin sobre el nervio citico o lo "pellizca". Esto puede ser el resultado de:  Un disco que sobresale demasiado (hernia de disco) entre los huesos de la columna vertebral (vrtebras).  Cambios relacionados con la edad en los discos de la columna vertebral (discopata degenerativa).  Un trastorno doloroso que afecta un msculo de los glteos (sndrome piriforme).  Un crecimiento seo adicional (espoln seo) cerca del nervio citico.  Una lesin o fractura de la pelvis.  Embarazo.  Tumor (poco frecuente). FACTORES DE RIESGO Los siguientes factores pueden hacer que usted sea propenso a sufrir esta afeccin:  Academic librarianracticar deportes en los que se ejerce presin sobre la columna vertebral o en los que la columna realiza mucho esfuerzo, como el ftbol americano o el levantamiento de pesas.  Tener poca fuerza y flexibilidad.  Antecedentes mdicos de lesiones en la espalda.  Antecedentes mdicos de ciruga en la espalda.  Estar sentado durante largos perodos.  Realizar  actividades que requieren agacharse o levantar objetos en forma repetida.  Obesidad. SNTOMAS Los sntomas pueden ser leves o graves, y pueden incluir los siguientes:  Cualquiera de los siguientes problemas en la parte inferior de la [place]palda, piernas, cadera o glteos: ? Hormigueo leve o dolor sordo. ? Sensacin de ardor. ? Dolor agudo.  Adormecimiento de la parte posterior de la pantorrilla o la planta del pie.  Debilidad en las piernas.  Dolor de espalda intenso que dificulta el movimiento. Estos sntomas podran empeorar al toser, estornudar o rerse, o cuando se est sentado o de pie durante perodos prolongados. El sobrepeso tambin puede FedExempeorar los sntomas. En algunos casos, los sntomas regresan luego de un [place]landtiempo. DIAGNSTICO Esta afeccin se puede diagnosticar en funcin de lo siguiente:  Sus sntomas.  Un examen fsico. El mdico podra indicarle que realice ciertos movimientos para controlar si estos desencadenan los sntomas.  Tambin pueden hacerle exmenes que incluyen lo siguiente: ? Anlisis de Spring[place]angre. ? Radiografas. ? Resonancia magntica (RM). ? Tomografa computarizada (TC). TRATAMIENTO En muchos casos, esta afeccin mejora por s sola, sin ningn tratamiento. Sin embargo, Scientist, research (medical)el tratamiento puede incluir lo siguiente:  Reduccin o modificacin de la actividad fsica en los perodos de Engineer, miningdolor.  Ejercicios y estiramiento para fortalecer el abdomen y Scientist, clinical (histocompatibility and immunogenetics)mejorar la flexibilidad de la columna vertebral.  Aplicacin de calor o hielo en la zona afectada.  Medicamentos para lo siguiente: ? Aliviar el dolor y la inflamacin. ? Relajar los msculos.  Medicamentos inyectables que ayudan a Engineer, materialsaliviar el dolor, la irritacin y la inflamacin alrededor del nervio citico (esteroides).  Ciruga. INSTRUCCIONES PARA EL CUIDADO EN EL HOGAR Medicamentos  Baxter Internationalome los medicamentos de venta libre y los recetados solamente como se lo haya indicado el mdico.  No conduzca ni opere  maquinaria pesada mientras toma analgsicos recetados. Control del dolor  Si se lo indican, aplique hielo en la zona afectada. ? Ponga el hielo en una bolsa plstica. ? Coloque una FirstEnergy Corptoalla entre la piel y la bolsa de hielo. ? Coloque el hielo durante 20 minutos, 2 a 3 veces por da.  Despus del hielo, aplique calor sobre la zona afectada antes de Education officer, environmentalrealizar ejercicio o con la frecuencia que le haya indicado el mdico. Use la fuente de calor que el mdico le recomiende, como una compresa de calor hmedo o una almohadilla trmica. ? Coloque una FirstEnergy Corptoalla entre la piel y la fuente de Airline pilotcalor. ? Aplique el calor durante 20 a 30minutos. ? Retire la fuente de calor si la piel se le pone de color rojo brillante. Esto es muy importante si no puede sentir el dolor, el calor o el fro. Puede correr un riesgo mayor de sufrir quemaduras. Actividad  Reanude sus actividades normales como se lo haya indicado el mdico. Pregntele al mdico qu actividades son seguras para usted. ? Evite las Liberty Mutualactividades que empeoran los sntomas.  Durante el da, descanse durante lapsos breves. Descansar recostado o de pie suele ser mejor que hacerlo sentado. ? Cuando descanse durante perodos ms largos, incorpore alguna Zimbabweactividad suave o ejercicios de ConAgra Foodselongacin entre perodos. Esto ayudar a Transport plannerevitar la rigidez y Chief Technology Officerel dolor. ? Evite estar sentado durante largos perodos sin moverse. Levntese y [place] Decaturmuvase al menos una vez cada hora.  Haga ejercicio y elongue habitualmente, como se lo haya indicado el mdico.  No levante nada que pese ms de 10libras (4,5kg) mientras tenga sntomas de citica. Aunque no tenga sntomas, evite levantar objetos pesados, en especial en forma repetida.  Siempre use las tcnicas de levantamiento correctas para levantar objetos, entre ellas: ? Flexionar las rodillas. ? Mantener la carga cerca del cuerpo. ? No torcerse. Instrucciones generales  Mantenga una buena postura. ? Evite reclinarse hacia  adelante cuando est sentado. ? Evite encorvar la espalda mientras est de pie.  Mantenga un peso saludable. El exceso de peso ejerce presin adicional sobre la espalda y hace que resulte difcil mantener una buena [place] in [place]ostura.  Use calzado con buen apoyo y cmodo. Evite usar tacones.  Evite dormir sobre un colchn que sea demasiado blando o demasiado duro. Un colchn que ofrezca un apoyo suficientemente firme para su espalda al dormir puede ayudar a Engineer, materialsaliviar el dolor.  Concurra a todas las visitas de control como se lo haya indicado el mdico. Esto es importante. SOLICITE ATENCIN MDICA SI:  El dolor lo despierta cuando est dormido.  El dolor empeora cuando se Brune[place] Darussalamacuesta.  El dolor es peor del que experiment en el pasado.  Los sntomas duran ms de 4 semanas.  Pierde peso en forma inexplicable.  SOLICITE ATENCIN MDICA DE INMEDIATO SI:  Pierde el control de la vejiga o del intestino (incontinencia).  Tiene los siguientes sntomas: ? Debilidad que empeora en la parte inferior de la espalda, la pelvis, los glteos o las piernas. ? Enrojecimiento o inflamacin en la espalda. ? Sensacin de ardor al ConocoPhillipsorinar.  Esta informacin no tiene Theme park managercomo fin reemplazar el consejo del mdico. Asegrese de hacerle al mdico cualquier pregunta que tenga. Document Released: 01/06/2005 Document Revised: 04/30/2015 Document Reviewed: 09/15/2014 Elsevier Interactive Patient Education  2017 [employer]

## 2017-01-05 ENCOUNTER — Telehealth: Payer: Self-pay | Admitting: General Practice

## 2017-01-05 NOTE — Telephone Encounter (Signed)
Copied from CRM [phone]#[id number]. Topic: Quick Communication - See Telephone Encounter >> Jan 05, 2017  1:21 PM Rudi CocoLathan, Omair Dettmer M, [place] wrote: CRM for notification. See Telephone encounter for:   01/05/17. Claris GladdenGale from [place] [phone])was returning call for Malachi BondsGloria to let her know that the Worker comp. Claim she is calling about that case has been closed with her office.  If injury is for back the claim is with Rudolpho SevinElizabeth Hoffman

## 2017-01-06 NOTE — Telephone Encounter (Signed)
Tried calling that [phone] number several times and after getting through the prompts it goes straight to a busy signal.. I dont know how to get in touch with Rudolpho SevinElizabeth Hoffman.

## 2017-01-29 NOTE — Telephone Encounter (Signed)
[facility] stating pt is scheduled through [facility] for 02/03/17 at 2:15pm for MRI. They requested we fax the order for this to them at [phone]. Order has been faxed 1/10

## 2017-04-02 ENCOUNTER — Encounter (HOSPITAL_COMMUNITY): Payer: Self-pay

## 2017-04-02 ENCOUNTER — Emergency Department (HOSPITAL_COMMUNITY)
Admission: EM | Admit: 2017-04-02 | Discharge: 2017-04-03 | Disposition: A | Discharge status: Home / Self Care | Payer: Self-pay | Attending: Emergency Medicine | Admitting: Emergency Medicine

## 2017-04-02 ENCOUNTER — Other Ambulatory Visit: Payer: Self-pay

## 2017-04-02 ENCOUNTER — Emergency Department (HOSPITAL_COMMUNITY): Payer: Self-pay

## 2017-04-02 DIAGNOSIS — M5416 Radiculopathy, lumbar region: Secondary | ICD-10-CM | POA: Insufficient documentation

## 2017-04-02 DIAGNOSIS — N3001 Acute cystitis with hematuria: Secondary | ICD-10-CM | POA: Insufficient documentation

## 2017-04-02 DIAGNOSIS — E119 Type 2 diabetes mellitus without complications: Secondary | ICD-10-CM | POA: Insufficient documentation

## 2017-04-02 DIAGNOSIS — F329 Major depressive disorder, single episode, unspecified: Secondary | ICD-10-CM | POA: Insufficient documentation

## 2017-04-02 DIAGNOSIS — Z9049 Acquired absence of other specified parts of digestive tract: Secondary | ICD-10-CM | POA: Insufficient documentation

## 2017-04-02 DIAGNOSIS — Z79899 Other long term (current) drug therapy: Secondary | ICD-10-CM | POA: Insufficient documentation

## 2017-04-02 LAB — URINALYSIS, ROUTINE W REFLEX MICROSCOPIC
Bilirubin Urine: NEGATIVE
Glucose, UA: >=500 mg/dL — AB
KETONES UR: NEGATIVE mg/dL
Leukocytes, UA: NEGATIVE
Nitrite: POSITIVE — AB
PROTEIN: 30 mg/dL — AB
Specific Gravity, Urine: 1.032 — ABNORMAL HIGH (ref 1.005–1.030)
pH: 6 (ref 5.0–8.0)

## 2017-04-02 LAB — POC URINE PREG, ED: PREG TEST UR: NEGATIVE

## 2017-04-02 LAB — I-STAT CHEM 8, ED
BUN: 13 mg/dL (ref 6–20)
Calcium, Ion: 1.18 mmol/L (ref 1.15–1.40)
Chloride: 100 mmol/L — ABNORMAL LOW (ref 101–111)
Creatinine, Ser: 0.4 mg/dL — ABNORMAL LOW (ref 0.44–1.00)
Glucose, Bld: 289 mg/dL — ABNORMAL HIGH (ref 65–99)
HEMATOCRIT: 43 % (ref 36.0–46.0)
HEMOGLOBIN: 14.6 g/dL (ref 12.0–15.0)
POTASSIUM: 3.5 mmol/L (ref 3.5–5.1)
Sodium: 135 mmol/L (ref 135–145)
TCO2: 22 mmol/L (ref 22–32)

## 2017-04-02 MED ORDER — IBUPROFEN 400 MG PO TABS
600.0000 mg | ORAL_TABLET | Freq: Once | ORAL | Status: AC
  Administered 2017-04-02: 600 mg via ORAL
  Filled 2017-04-02: qty 1

## 2017-04-02 MED ORDER — CYCLOBENZAPRINE HCL 10 MG PO TABS
10.0000 mg | ORAL_TABLET | Freq: Once | ORAL | Status: AC
  Administered 2017-04-02: 10 mg via ORAL
  Filled 2017-04-02: qty 1

## 2017-04-02 NOTE — ED Triage Notes (Signed)
Patient complains of ongoing back pain with radiation down leg x 1 week. Has had injection for same. Denies dysuria

## 2017-04-02 NOTE — ED Notes (Signed)
Patient transported to MRI 
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
## 2017-04-03 LAB — HEMOGLOBIN A1C
Hgb A1c MFr Bld: 11.5 % — ABNORMAL HIGH (ref 4.8–5.6)
MEAN PLASMA GLUCOSE: 283.35 mg/dL

## 2017-04-03 MED ORDER — CYCLOBENZAPRINE HCL 5 MG PO TABS: 5.0000 mg | ORAL_TABLET | Freq: Three times a day (TID) | ORAL | 0 refills | Status: DC | PRN

## 2017-04-03 MED ORDER — METFORMIN HCL 500 MG PO TABS: 500.0000 mg | ORAL_TABLET | Freq: Two times a day (BID) | ORAL | 0 refills | Status: DC

## 2017-04-03 MED ORDER — SULFAMETHOXAZOLE-TRIMETHOPRIM 800-160 MG PO TABS: 1.0000 | ORAL_TABLET | Freq: Two times a day (BID) | ORAL | 0 refills | Status: AC

## 2017-04-03 NOTE — Discharge Instructions (Signed)
Do not drive while taking the muscle relaxer [facility]  Follow up with your company doctor for your back.

## 2017-04-03 NOTE — ED Notes (Addendum)
Reviewed d/c instructions via translator, including discussion of s/sx of hypoglycemia. Pt indicated understanding and had no additional questions. Pt departed in NAD, refused use of wheelchair.

## 2017-12-20 ENCOUNTER — Emergency Department (HOSPITAL_COMMUNITY)
Admission: EM | Admit: 2017-12-20 | Discharge: 2017-12-20 | Disposition: A | Discharge status: Home / Self Care | Payer: Self-pay | Attending: Emergency Medicine | Admitting: Emergency Medicine

## 2017-12-20 ENCOUNTER — Emergency Department (HOSPITAL_COMMUNITY): Payer: Self-pay

## 2017-12-20 ENCOUNTER — Encounter (HOSPITAL_COMMUNITY): Payer: Self-pay | Admitting: Emergency Medicine

## 2017-12-20 DIAGNOSIS — R519 Headache, unspecified: Secondary | ICD-10-CM

## 2017-12-20 DIAGNOSIS — Z7984 Long term (current) use of oral hypoglycemic drugs: Secondary | ICD-10-CM | POA: Insufficient documentation

## 2017-12-20 DIAGNOSIS — R51 Headache: Secondary | ICD-10-CM | POA: Insufficient documentation

## 2017-12-20 DIAGNOSIS — Z79899 Other long term (current) drug therapy: Secondary | ICD-10-CM | POA: Insufficient documentation

## 2017-12-20 LAB — CBC WITH DIFFERENTIAL/PLATELET
Abs Immature Granulocytes: 0.04 10*3/uL (ref 0.00–0.07)
BASOS PCT: 1 %
Basophils Absolute: 0.1 10*3/uL (ref 0.0–0.1)
EOS ABS: 0.2 10*3/uL (ref 0.0–0.5)
EOS PCT: 3 %
HCT: 43.5 % (ref 36.0–46.0)
Hemoglobin: 14.9 g/dL (ref 12.0–15.0)
IMMATURE GRANULOCYTES: 1 %
Lymphocytes Relative: 18 %
Lymphs Abs: 1.4 10*3/uL (ref 0.7–4.0)
MCH: 29.9 pg (ref 26.0–34.0)
MCHC: 34.3 g/dL (ref 30.0–36.0)
MCV: 87.2 fL (ref 80.0–100.0)
MONOS PCT: 5 %
Monocytes Absolute: 0.4 10*3/uL (ref 0.1–1.0)
Neutro Abs: 6 10*3/uL (ref 1.7–7.7)
Neutrophils Relative %: 72 %
PLATELETS: 356 10*3/uL (ref 150–400)
RBC: 4.99 MIL/uL (ref 3.87–5.11)
RDW: 12.1 % (ref 11.5–15.5)
WBC: 8.2 10*3/uL (ref 4.0–10.5)
nRBC: 0 % (ref 0.0–0.2)

## 2017-12-20 LAB — BASIC METABOLIC PANEL
Anion gap: 10 (ref 5–15)
BUN: 13 mg/dL (ref 6–20)
CALCIUM: 9.6 mg/dL (ref 8.9–10.3)
CO2: 26 mmol/L (ref 22–32)
CREATININE: 0.74 mg/dL (ref 0.44–1.00)
Chloride: 99 mmol/L (ref 98–111)
GFR calc Af Amer: >60 mL/min (ref >60)
Glucose, Bld: 404 mg/dL — ABNORMAL HIGH (ref 70–99)
Potassium: 4 mmol/L (ref 3.5–5.1)
Sodium: 135 mmol/L (ref 135–145)

## 2017-12-20 LAB — CBG MONITORING, ED: Glucose-Capillary: 299 mg/dL — ABNORMAL HIGH (ref 70–99)

## 2017-12-20 LAB — I-STAT BETA HCG BLOOD, ED (MC, WL, AP ONLY)

## 2017-12-20 MED ORDER — SODIUM CHLORIDE 0.9 % IV BOLUS
1000.0000 mL | Freq: Once | INTRAVENOUS | Status: AC
  Administered 2017-12-20: 1000 mL via INTRAVENOUS

## 2017-12-20 MED ORDER — METOCLOPRAMIDE HCL 5 MG/ML IJ SOLN
10.0000 mg | Freq: Once | INTRAMUSCULAR | Status: AC
  Administered 2017-12-20: 10 mg via INTRAVENOUS
  Filled 2017-12-20: qty 2

## 2017-12-20 MED ORDER — MORPHINE SULFATE (PF) 4 MG/ML IV SOLN
4.0000 mg | Freq: Once | INTRAVENOUS | Status: AC
  Administered 2017-12-20: 4 mg via INTRAVENOUS
  Filled 2017-12-20: qty 1

## 2017-12-20 MED ORDER — DIPHENHYDRAMINE HCL 50 MG/ML IJ SOLN
25.0000 mg | Freq: Once | INTRAMUSCULAR | Status: AC
  Administered 2017-12-20: 25 mg via INTRAVENOUS
  Filled 2017-12-20: qty 1

## 2017-12-20 MED ORDER — KETOROLAC TROMETHAMINE 15 MG/ML IJ SOLN
15.0000 mg | Freq: Once | INTRAMUSCULAR | Status: AC
  Administered 2017-12-20: 15 mg via INTRAVENOUS
  Filled 2017-12-20: qty 1

## 2017-12-20 NOTE — Discharge Instructions (Signed)
It was my pleasure taking care of you today! Follow up with your doctor if symptoms are not improving.  If you develop worsening headache, new fever, new neck stiffness, rash, weakness, numbness, trouble with your speech, trouble walking, new or worsening symptoms or any concerning symptoms, please return to the ED immediately.

## 2017-12-20 NOTE — ED Notes (Signed)
Patient able to ambulate independently  

## 2017-12-20 NOTE — ED Triage Notes (Signed)
Pt presents to ED for assessment after "I got really mad" on Friday, she began to have a central headache cutting down the middle of her head.  Also c/o spotted vision in her left eye, tongue tingling, numbness to her left arm.  No drift, no facial droop noted.

## 2017-12-20 NOTE — ED Notes (Signed)
Pt states she also had a friend give her an injection of "neobrion" on Friday to "help with my nerves".

## 2017-12-20 NOTE — ED Notes (Signed)
Discussed patient's sugar and when to follow up with PCP.  Discussed continuing headache, and discussed possibility of following up with a neurologist.

## 2017-12-20 NOTE — ED Provider Notes (Signed)
Care assumed from previous provider Dr. Anitra LauthPlunkett. Please see note for further details. Case discussed, plan agreed upon. Will reevaluate after medications.  If headache improved, likely discharged home.   Patient reevaluated, headache still present, but much improved.  She feels comfortable with going home. Reasons to return to the emergency department discussed.  All questions answered.   Milanna Kozlov, Chase PicketJaime Pilcher, PA-C 12/20/17 2026    Jacalyn LefevreHaviland, Julie, MD 12/20/17 2028
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
## 2018-03-19 ENCOUNTER — Encounter: Payer: Self-pay | Admitting: Internal Medicine

## 2018-03-19 ENCOUNTER — Ambulatory Visit: Payer: Self-pay | Attending: Internal Medicine | Admitting: Internal Medicine

## 2018-03-19 VITALS — BP 131/93 | HR 85 | Temp 98.5°F | Resp 16 | Ht 61.0 in | Wt 180.0 lb

## 2018-03-19 DIAGNOSIS — O99210 Obesity complicating pregnancy, unspecified trimester: Secondary | ICD-10-CM | POA: Insufficient documentation

## 2018-03-19 DIAGNOSIS — E669 Obesity, unspecified: Secondary | ICD-10-CM

## 2018-03-19 DIAGNOSIS — I10 Essential (primary) hypertension: Secondary | ICD-10-CM | POA: Insufficient documentation

## 2018-03-19 DIAGNOSIS — E118 Type 2 diabetes mellitus with unspecified complications: Principal | ICD-10-CM

## 2018-03-19 DIAGNOSIS — E1165 Type 2 diabetes mellitus with hyperglycemia: Secondary | ICD-10-CM

## 2018-03-19 DIAGNOSIS — IMO0002 Reserved for concepts with insufficient information to code with codable children: Secondary | ICD-10-CM | POA: Insufficient documentation

## 2018-03-19 DIAGNOSIS — Z6834 Body mass index (BMI) 34.0-34.9, adult: Secondary | ICD-10-CM

## 2018-03-19 DIAGNOSIS — Z23 Encounter for immunization: Secondary | ICD-10-CM

## 2018-03-19 HISTORY — DX: Reserved for concepts with insufficient information to code with codable children: IMO0002

## 2018-03-19 HISTORY — DX: Essential (primary) hypertension: I10

## 2018-03-19 LAB — GLUCOSE, POCT (MANUAL RESULT ENTRY): POC Glucose: 428 mg/dl — AB (ref 70–99)

## 2018-03-19 LAB — POCT GLYCOSYLATED HEMOGLOBIN (HGB A1C): HBA1C, POC (CONTROLLED DIABETIC RANGE): 13.1 % — AB (ref 0.0–7.0)

## 2018-03-19 LAB — POCT URINALYSIS DIP (CLINITEK)
BILIRUBIN UA: NEGATIVE
BILIRUBIN UA: NEGATIVE mg/dL
Leukocytes, UA: NEGATIVE
Nitrite, UA: NEGATIVE
PH UA: 6 (ref 5.0–8.0)
POC PROTEIN,UA: 30 — AB
Spec Grav, UA: 1.01 (ref 1.010–1.025)
Urobilinogen, UA: 0.2 E.U./dL

## 2018-03-19 MED ORDER — LISINOPRIL 5 MG PO TABS: 5.0000 mg | ORAL_TABLET | Freq: Every day | ORAL | 3 refills | Status: DC

## 2018-03-19 MED ORDER — INSULIN GLARGINE 100 UNIT/ML SOLOSTAR PEN: 10.0000 [IU] | PEN_INJECTOR | Freq: Every day | SUBCUTANEOUS | 3 refills | Status: DC

## 2018-03-19 MED ORDER — METFORMIN HCL 500 MG PO TABS: 500.0000 mg | ORAL_TABLET | Freq: Two times a day (BID) | ORAL | 0 refills | Status: DC

## 2018-03-19 MED ORDER — GLUCOSE BLOOD VI STRP: ORAL_STRIP | 12 refills | Status: DC

## 2018-03-19 MED ORDER — TRUE METRIX METER W/DEVICE KIT: PACK | 0 refills | Status: DC

## 2018-03-19 MED ORDER — TRUEPLUS LANCETS 28G MISC: 6 refills | Status: DC

## 2018-03-19 MED FILL — TRUEplus LANCETS 28G MISC: 100 days supply | Qty: 100 | Fill #0

## 2018-03-19 MED FILL — !LANTUS SOLOSTAR 100UNITS/M: 100 | 30 days supply | Qty: 3 | Fill #0

## 2018-03-19 MED FILL — metFORMIN HCL 500 MG TABS: 500 | 30 days supply | Qty: 60 | Fill #0

## 2018-03-19 MED FILL — LISINOPRIL 5 MG TAB: 5 | 30 days supply | Qty: 30 | Fill #0

## 2018-03-19 MED FILL — TRUE METRIX TEST STRIP: 30 days supply | Qty: 100 | Fill #0

## 2018-03-19 MED FILL — !TRUE METRIX BLOOD GLUCOSE: 1 days supply | Qty: 1 | Fill #0
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
## 2018-03-19 NOTE — Progress Notes (Signed)
[id number] a1c-13.1   Pt states it has been 4 months since she stopped taking her metformin   Pt states she has bumps around her eyes that itches  Pt advised she has an allergy to flu shot. She validated that she has itchiness. Still requested flu shot.

## 2018-03-19 NOTE — Patient Instructions (Addendum)
Informacin bsica sobre la diabetes Diabetes Basics  La diabetes (diabetes mellitus) es una enfermedad de larga duracin (crnica). Se produce cuando el cuerpo no utiliza Occupational hygienist (glucosa) que se libera de los alimentos despus de comer. La diabetes puede deberse a uno de Mirant o a ambos:  El pncreas no produce suficiente cantidad de una hormona llamada insulina.  El cuerpo no reacciona de forma normal a la insulina que produce. La insulina permite que ciertos azcares (glucosa) ingresen a las clulas del cuerpo. Esto le proporciona energa. Si tiene diabetes, los azcares no pueden ingresar a las clulas. Esto produce un aumento del nivel de Dispensing optician (hiperglucemia). Siga estas indicaciones en su casa: Cmo se trata la diabetes? Es posible que tenga que administrarse insulina u otros medicamentos para la diabetes todos los [place] para mantener el nivel de Location manager en la sangre equilibrado. Adminstrese los medicamentos para la diabetes todos los [place] se lo haya indicado el mdico. Haga una lista de los medicamentos para la diabetes aqu: Medicamentos para la diabetes  Nombre del medicamento: ______________________________ ? Cantidad (dosis): ________________ Mellody Drown (a.m./p.m.): _______________ Wilford Grist: ___________________________________  Micki Riley medicamento: ______________________________ ? Cantidad (dosis): ________________ Mellody Drown (a.m./p.m.): _______________ Wilford Grist: ___________________________________  Micki Riley medicamento: ______________________________ ? Cantidad (dosis): ________________ Mellody Drown (a.m./p.m.): _______________ Wilford Grist: ___________________________________ Si [place] insulina, aprender cmo aplicrsela con inyecciones. Es posible que deba ajustar la cantidad en funcin de los alimentos que coma. Haga una lista de los tipos de [place] aqu: Insulina  Tipo de insulina: ______________________________ ? Cantidad (dosis):  ________________ Mellody Drown (a.m./p.m.): _______________ Wilford Grist: ___________________________________  Suezanne Jacquet: ______________________________ ? Cantidad (dosis): ________________ Mellody Drown (a.m./p.m.): _______________ Wilford Grist: ___________________________________  Suezanne Jacquet: ______________________________ ? Cantidad (dosis): ________________ Mellody Drown (a.m./p.m.): _______________ Wilford Grist: ___________________________________  Suezanne Jacquet: ______________________________ ? Cantidad (dosis): ________________ Mellody Drown (a.m./p.m.): _______________ Wilford Grist: ___________________________________  Suezanne Jacquet: ______________________________ ? Cantidad (dosis): ________________ Mellody Drown (a.m./p.m.): _______________ Wilford Grist: ___________________________________ Cmo me controlo el nivel de azcar en la sangre?  Controle sus niveles de azcar en la sangre con un medidor de glucemia segn las indicaciones del mdico. El mdico fijar los objetivos del tratamiento para usted. Generalmente, los resultados de los niveles de azcar en la sangre deben ser los siguientes:  Antes de las comidas (preprandial): de 80 a 139m/dl (de 4,4 a 7,275ml/l).  Despus de las comidas (posprandial): por debajo de 18060ml (6m47ml).  Nivel de A1c: menos del 7%. Anote las veces que se controlar los niveles de azcar en la sangre: Controles de azcar en la sangre  Hora: _______________ NotaWilford Grist_________________________________  HoraMellody Drown_____________ Notas: ___________________________________  Hora: _______________ Notas: ___________________________________  Hora: _______________ Notas: ___________________________________  Hora: _______________ Notas: ___________________________________  Hora: _______________ Notas: ___________________________________  Qu dSander Nephewo saber acerca del nivel bajo de azcar en la sangre? Un nivel bajo de azcar en la sangre se denomina hipoglucemia. Este cuadro ocurre cuando el  nivel de azcar en la sangre es igual o menor que 70mg20m(3,9mmol77m. Entre los sntomas, se pueden incluir los siguientes:  Sentir: ? HambreWaureganeocupacin o nervios (ansiedad). ? Sudoracin y piel hIntel Corporationnfusin. ? Mareos. ? Somnolencia. ? Ganas de vomitar (nuseas).  Tener: ? Latidos cardacos acelerados. ? Dolor de cabezaNetherlandsmbios en la visin. ? Hormigueo y falta de sensibilidad (entumecimiento) alrededor de la boca, los labios o la lenguaUpper Fruitlandvimientos espasmdicos que no puede controlar (convulsiones).  Dificultades para hacer lo siguiente: ? Moverse (coordinacin). ? Dormir. ? Desmayos. ? Molestarse con facilidad (irritabilidad). Tratamiento del nivelDover  bajo de azcar en la sangre Para tratar un nivel bajo de azcar en la sangre, ingiera un alimento o una bebida azucarada de inmediato. Si puede pensar con claridad y tragar de manera segura, siga la regla 15/15, que consiste en lo siguiente:  Consumir 15gramos de un hidrato de carbono de accin rpida (carbohidrato). Hable con su mdico acerca de cunto debera consumir.  Algunos hidratos de carbono de accin rpida son: ? Comprimidos de azcar (pastillas de glucosa). Consuma 3o 4pastillas de glucosa. ? De 6 a 8unidades de caramelos duros. ? De 4 a 6onzas (de 120 a 136m) de jugo de frutas. ? De 4 a 6onzas (de 120 a 1565m de refresco comn (no diettico). ? 1 cucharada (1535mde miel o azcar.  Contrlese el nivel de azcar en la sangre 31m45mos despus de ingerir el hidrato de carbono.  Si el nivel de azcar en la sangre todava es igual o menor que 70mg51m(3,9mmol30m, ingiera nuevamente 15gramos de un hidrato de carbono.  Si el nivel de azcar en la sangre no supera los 70mg/d56m,9mmol/l38mespus de 3intentos, solicite ayuda de inmediato.  Ingiera una comida o una colacin en el transcurso de 1hora despus de que el nivel de azcar en la sangre se haya normalizado. Tratamiento del  nivel muy bajo de azcar en la sangre Si el nivel de azcar en la sangre es igual o menor que 54mg/dl 61mol/l),26mgnifica que est muy bajo (hipoglucemia grave). Esto es una emergeEngineer, maintenance (IT)e a ver si los sntomas desaparecen. Solicite atencin mdica de inmediato. Comunquese con el servicio de emergencias de su localidad (911 en los [place]). No conduzca por sus propios medios hasta el hGoldman Sachs Preguntas para hacerle al mdico  Es necesario que me rena con un instrucRadio broadcast assistantdado de la diabetes?  Qu equipos necesitar para cuidarme en casa?  Qu medicamentos para la diabetes necesito? Cundo debo tomarlos?  Con qu frecuencia debo controlar mi nivel de azcar en la sangre?  A qu nmero puedo llamar si tengo preguntas?  Cundo es mi prxima cita con el mdico?  Dnde puedo encontrar un grupo de apoyo para las personas con diabetes? Dnde buscar ms informacin  American Diabetes Association (Asociacin Estadounidense de la Diabetes): www.diabetes.org  American Association of Diabetes Educators (Asociacin Estadounidense de Instructores para el Cuidado de la Diabetes): www.diabeteseducator.org/patient-resources Comunquese con un mdico si:  El nivel de azcar en la sangre es igual o mayor que 240mg/dl (122mmol/dl) d32mnte 2das seguidos.  Ha estado enfermo o ha tenido fiebre durante 2das o ms y no mejora.  Tiene alguno de estos problemas durante ms de 6horas: ? No puede comer ni beber. ? Siente malestar estomacal (nuseas). ? Vomita. ? Presenta heces lquidas (diarrea). Solicite ayuda inmediatamente si:  El nivel de azcar en la sangre est por debajo de 54mg/dl (3mm5m).  Se11mente confundido.  Tiene dificultad para hacer lo siguiente: ? Pensar con claridad. ? Respirar. Resumen  La diabetes (diabetes mellitus) es una enfermedad de larga duracin (crnica). Se produce cuando el cuerpo no utiliza correctamente Occupational hygieniste se libera de los alimentos despus de la digestin.  Aplquese la insulina y tome los medicamentos para la diabetes como se lo hayan indicado.  Contrlese el nivel de azcar en la sangre todos los das, con la frEast Basinencia que le hayan indicado.  Concurra a todas las visitas de control como se lo haya indicado el mdico. Esto es importante. Esta informacin no tiene como fin reempPsychologist, clinical  consejo del mdico. Asegrese de hacerle al mdico cualquier pregunta que tenga. Document Released: 05/15/2017 Document Revised: 08/18/2017 Document Reviewed: 05/15/2017 Elsevier Interactive Patient Education  2019 Elsevier Inc.   Influenza Virus Vaccine injection (Fluarix) Qu es este medicamento? La VACUNA ANTIGRIPAL ayuda a disminuir el riesgo de contraer la influenza, tambin conocida como la gripe. La vacuna solo ayuda a protegerle contra algunas cepas de influenza. Esta vacuna no ayuda a reducir Nurse, adult de contraer influenza pandmica H1N1. Este medicamento puede ser utilizado para otros usos; si tiene alguna pregunta consulte con su proveedor de atencin mdica o con su farmacutico. MARCAS COMUNES: Fluarix, Fluzone Qu le debo informar a mi profesional de la salud antes de tomar este medicamento? Necesita saber si usted presenta alguno de los siguientes problemas o situaciones: -trastorno de sangrado como hemofilia -fiebre o infeccin -sndrome de Guillain-Barre u otros problemas neurolgicos -problemas del sistema inmunolgico -infeccin por el virus de la inmunodeficiencia humana (VIH) o SIDA -niveles bajos de plaquetas en la sangre -esclerosis mltiple -una Automotive engineer o inusual a las vacunas antigripales, a los huevos, protenas de pollo, al ltex, a la gentamicina, a otros medicamentos, alimentos, colorantes o conservantes -si est embarazada o buscando quedar embarazada -si est amamantando a un beb Cmo debo utilizar este medicamento? Esta vacuna se administra mediante  inyeccin por va intramuscular. Lo administra un profesional de [employer]. Recibir una copia de informacin escrita sobre la vacuna antes de cada vacuna. Asegrese de leer este folleto cada vez cuidadosamente. Este folleto puede cambiar con frecuencia. Hable con su pediatra para informarse acerca del uso de este medicamento en nios. Puede requerir atencin especial. Sobredosis: Pngase en contacto inmediatamente con un centro toxicolgico o una sala de urgencia si usted cree que haya tomado demasiado medicamento. ATENCIN: Reynolds [employer] es solo para usted. No comparta este medicamento con nadie. Qu sucede si me olvido de una dosis? No se aplica en este caso. Qu puede interactuar con este medicamento? -quimioterapia o radioterapia -medicamentos que suprimen el sistema inmunolgico, tales como etanercept, anakinra, infliximab y adalimumab -medicamentos que tratan o previenen cogulos sanguneos, como warfarina -fenitona -medicamentos esteroideos, como la prednisona o la cortisona -teofilina -vacunas Puede ser que esta lista no menciona todas las posibles interacciones. Informe a su profesional de [employer] de In[employer] Inc productos a base de hierbas, medicamentos de [place] o suplementos nutritivos que est tomando. Si usted fuma, consume bebidas alcohlicas o si utiliza drogas ilegales, indqueselo tambin a su profesional de [employer]. Algunas sustancias pueden interactuar con su medicamento. A qu debo estar atento al usar PPL Corporation? Informe a su mdico o a Producer, television/film/video de la [employer] General todos los efectos secundarios que persistan despus de [address]. Llame a su proveedor de atencin mdica si se presentan sntomas inusuales dentro de las 6 semanas posteriores a la vacunacin. Es posible que todava pueda contraer la gripe, pero la enfermedad no ser tan fuerte como normalmente. No puede contraer la gripe de esta vacuna. La vacuna antigripal no le protege contra resfros u otras  enfermedades que pueden causar [place]. Debe vacunarse cada ao. Qu efectos secundarios puedo tener al [employer] este medicamento? Efectos secundarios que debe informar a su mdico o a Producer, television/film/video de la salud tan pronto como sea posible: -reacciones alrgicas como erupcin cutnea, picazn o urticarias, hinchazn de la cara, labios o lengua Efectos secundarios que, por lo general, no requieren atencin mdica (debe informarlos a su mdico o a su profesional de la salud si persisten o  si son molestos): -fiebre -dolor de cabeza -molestias y dolores musculares -dolor, sensibilidad, enrojecimiento o Paramedic de la inyeccin -cansancio o debilidad Puede ser que esta lista no menciona todos los posibles efectos secundarios. Comunquese a su mdico por asesoramiento mdico Hewlett[employer]ard. Usted puede informar los efectos secundarios a la FDA por telfono al 1-800-FDA-1088. Dnde debo guardar mi medicina? Esta vacuna se administra solamente en clnicas, farmacias, consultorio mdico u otro consultorio de un profesional de la salud y no Teacher, early years/pre en su domicilio. ATENCIN: Este folleto es un resumen. Puede ser que no cubra toda la posible informacin. Si usted tiene preguntas acerca de esta medicina, consulte con su mdico, su farmacutico o su profesional de Radiographer, therapeutic.  2019 Elsevier/Gold Standard (2009-07-10 15:31:40)   Madilyn Fireman antineumoccica de polisacridos: lo que debe saber Pneumococcal Polysaccharide Vaccine: What You Need to Know 1. Por qu vacunarse? La vacunacin puede proteger a los ONEOK (y a algunos nios y adultos ms jvenes) de la enfermedad neumoccica. La enfermedad neumoccica es causada por una bacteria que puede contagiarse de [place] persona a otra por contacto directo. Puede provocar infecciones en los odos y tambin infecciones ms graves en:  Los pulmones (neumona),  La sangre (bacteriemia), y  Las membranas que cubren el  cerebro y la mdula espinal (meningitis). La meningitis puede causar sordera y dao cerebral, y puede ser mortal. Cualquier persona puede contraer la enfermedad neumoccica, pero los nios menores de 2 aos, las personas con Principal Financial, los adultos mayores de 65 aos y los fumadores son los que corren un mayor riesgo. Cada ao, mueren alrededor de [id number] adultos mayores a causa de la enfermedad [employer] [place]. El tratamiento de las infecciones neumoccicas con penicilina y otros medicamentos era ms efectivo en el pasado. Sin embargo, algunas cepas de la bacteria que causa la enfermedad se han vuelto resistentes a estos medicamentos. Esto hace que la prevencin de la enfermedad mediante la vacunacin sea an ms importante. 2. Madilyn Fireman antineumoccica de polisacridos (PPSV23) La vacuna antineumoccica de polisacridos (PPSV23) protege contra 23tipos de bacterias neumoccicas. No previene todas las enfermedades neumoccicas. La vacuna PPSV23 se recomienda para las siguientes personas:  Todos los adultos de 65aos en adelante.  Teresita Madura persona de 2a 64aos que tenga un problema de salud a Air cabin crew.  Huntsman Corporation de 2 a 64 aos que tengan el sistema inmunitario dbil.  Los adultos de 19 a 64 aos que fumen o tengan asma. La Harley-Davidson de las personas solo necesitan una dosis de la vacuna PPSV. Para ciertos grupos de alto riesgo se recomienda una segunda dosis. Las [employer] International de 65 aos deben recibir una dosis de la vacuna, aun si recibieron una o ms dosis antes de [employer]. El mdico puede brindarle ms informacin sobre estas recomendaciones. La mayora de los adultos sanos adquiere proteccin 2 a 3semanas despus de haber recibido la vacuna. 3. Algunas personas no deben recibir la vacuna  Teresita Madura persona que haya sufrido una reaccin alrgica potencialmente mortal a la PPSV no debe recibir otra dosis.  Las personas que tengan Programmer, multimedia grave a los  componentes de la vacuna PPSV no deben recibirla. Informe a su mdico si sufre de algn tipo de alergia grave.  Teresita Madura persona que est enferma en un grado moderado o grave el da en que est programada la vacunacin, probablemente deba esperar a recuperarse para recibirla. Por lo general, una persona con una enfermedad leve puede vacunarse.  Los nios menores  de 2 aos no deben recibir Praxair.  No hay pruebas de que la vacuna PPSV sea perjudicial para las mujeres embarazadas o el feto. No obstante, como precaucin, las mujeres que necesiten aplicarse la vacuna deben hacerlo antes de quedar embarazadas, si es posible. 4. Riesgos de [place] reaccin a la vacuna Con cualquier medicamento, incluso las vacunas, existe la posibilidad de que aparezcan efectos secundarios. Estos suelen ser leves y Geneticist, molecular por s solos, pero tambin es posible que se presenten reacciones graves. Aproximadamente la mitad de las personas que reciben la PPSV presentan efectos secundarios leves, como enrojecimiento o Art therapist donde se aplic la vacuna, que desaparecen a los [address] Menos de 1 de cada 100personas presenta fiebre, dolores musculares o reacciones locales ms graves. Problemas que podran ocurrir despus de cualquier vacuna:  Las personas a veces se desmayan despus de un procedimiento mdico, incluida la vacunacin. Permanecer sentado o recostado durante puede ayudar a Lub[employer] Corporation y las lesiones causadas por las cadas. Informe al mdico si se siente mareado, tiene cambios en la visin o zumbidos en los odos.  Algunas personas sienten un dolor intenso en el hombro y tienen dificultad para mover el brazo donde se coloc la vacuna. Esto sucede con muy poca frecuencia.  Cualquier medicamento puede causar una reaccin alrgica grave. Dichas reacciones son Lynnae Sandhoff poco frecuentes con una vacuna (se calcula que menos de 1en un milln de dosis) y se producen unos minutos a unas horas  despus de la vacunacin. Al igual que con cualquier Automatic Data, existe una probabilidad muy remota de que una vacuna cause una lesin grave o la [place]. La seguridad de las vacunas se controla permanentemente. Para obtener ms informacin, visite: http://floyd.org/ 5. Qu pasa si hay una reaccin grave? A qu signos debo estar atento? Est atento a la aparicin de signos preocupantes, como los de una reaccin alrgica grave, fiebre muy alta o comportamiento fuera de lo normal. Los signos de una reaccin alrgica grave pueden incluir ronchas, hinchazn de la cara y la garganta, dificultad para respirar, latidos cardacos acelerados, mareos y debilidad. Generalmente, estos comienzan entre unos pocos minutos y algunas horas despus de la vacunacin. Qu debo hacer? Si usted piensa que se trata de una reaccin alrgica grave o de otra emergencia que no puede esperar, llame al 911 o dirjase al hospital ms cercano. De lo contrario, llame al mdico. Despus, la reaccin debe informarse al Sistema de informe de eventos adversos de vacunas (Vaccine Adverse Event Reporting System, VAERS). Su mdico puede presentar este informe, o puede hacerlo usted mismo a travs del sitio web de VAERS, en www.vaers.LAgents.no, o llamando al ([phone]) [phone]. El Sistema de informe de eventos adversos de vacunas no brinda recomendaciones mdicas. 6. Cmo puedo obtener ms informacin?  Consulte a su mdico. Este puede darle el prospecto de la vacuna o recomendarle otras fuentes de informacin.  Comunquese con el servicio de salud de su localidad o [address].  Comunquese con los Centros para el Control y la Prevencin de Child psychotherapist for Disease Control and Prevention, CDC): ? Llame al [phone] (1-800-CDC-INFO) o ? Visite el sitio web de los CDC en PicCapture.uy Declaracin de informacin de los CDC sobre la vacuna, vacuna antineumoccica de polisacridos (24/04/2013) Esta informacin no  tiene Theme park manager el consejo del mdico. Asegrese de hacerle al mdico cualquier pregunta que tenga. Document Released: 04/04/2008 Document Revised: 08/18/2017 Document Reviewed: 08/18/2017 Elsevier Interactive Patient Education  2019 [employer].

## 2018-03-20 LAB — MICROALBUMIN / CREATININE URINE RATIO
CREATININE, UR: 23.7 mg/dL
MICROALB/CREAT RATIO: 641 mg/g{creat} — AB (ref 0–29)
Microalbumin, Urine: 151.8 ug/mL

## 2018-03-22 ENCOUNTER — Ambulatory Visit: Payer: Self-pay

## 2018-03-22 ENCOUNTER — Ambulatory Visit: Payer: Self-pay | Attending: Family Medicine

## 2018-03-22 ENCOUNTER — Encounter: Payer: Self-pay | Admitting: Internal Medicine

## 2018-03-22 ENCOUNTER — Ambulatory Visit (HOSPITAL_BASED_OUTPATIENT_CLINIC_OR_DEPARTMENT_OTHER): Payer: Self-pay | Admitting: Pharmacist

## 2018-03-22 DIAGNOSIS — IMO0002 Reserved for concepts with insufficient information to code with codable children: Secondary | ICD-10-CM

## 2018-03-22 DIAGNOSIS — E1165 Type 2 diabetes mellitus with hyperglycemia: Secondary | ICD-10-CM

## 2018-03-22 DIAGNOSIS — E118 Type 2 diabetes mellitus with unspecified complications: Secondary | ICD-10-CM

## 2018-03-22 DIAGNOSIS — R809 Proteinuria, unspecified: Secondary | ICD-10-CM | POA: Insufficient documentation

## 2018-03-22 NOTE — Patient Instructions (Signed)
Thank you for coming to see me today. Please do the following:  1. If you need refills, call our pharmacy. The number is [phone]. 2. If you need me for any questions, call me. The number is [phone]. 3. Continue checking blood sugars at home in the morning. Your goal is 80 - 130. 4. Follow-up with Dr. Laural Benes in April.   Spanish translation:   Gracias por venir a verme hoy. Por favor haga lo siguiente:   1. Si necesita recargas, llame a nuestra farmacia. El nmero es [phone].  2. Si me necesitas para cualquier consulta, llmame. El nmero es [phone].  3. Contine revisando el azcar en la sangre en casa por la [place]. Tu objetivo es 80 - 130.  4. Seguimiento con el Dr. Laural Benes en abril.

## 2018-03-23 ENCOUNTER — Encounter: Payer: Self-pay | Admitting: Pharmacist

## 2018-03-23 LAB — LIPID PANEL
CHOLESTEROL TOTAL: 197 mg/dL (ref 100–199)
Chol/HDL Ratio: 4 ratio (ref 0.0–4.4)
HDL: 49 mg/dL (ref >39)
LDL Calculated: 83 mg/dL (ref 0–99)
Triglycerides: 327 mg/dL — ABNORMAL HIGH (ref 0–149)
VLDL Cholesterol Cal: 65 mg/dL — ABNORMAL HIGH (ref 5–40)

## 2018-03-23 LAB — CBC
Hematocrit: 50.5 % — ABNORMAL HIGH (ref 34.0–46.6)
Hemoglobin: 16.3 g/dL — ABNORMAL HIGH (ref 11.1–15.9)
MCH: 30 pg (ref 26.6–33.0)
MCHC: 32.3 g/dL (ref 31.5–35.7)
MCV: 93 fL (ref 79–97)
Platelets: 314 10*3/uL (ref 150–450)
RBC: 5.44 x10E6/uL — AB (ref 3.77–5.28)
RDW: 12.1 % (ref 11.7–15.4)
WBC: 6.3 10*3/uL (ref 3.4–10.8)

## 2018-03-23 LAB — COMPREHENSIVE METABOLIC PANEL
ALT: 38 IU/L — ABNORMAL HIGH (ref 0–32)
AST: 26 IU/L (ref 0–40)
Albumin/Globulin Ratio: 1.6 (ref 1.2–2.2)
Albumin: 4.6 g/dL (ref 3.8–4.8)
Alkaline Phosphatase: 100 IU/L (ref 39–117)
BUN/Creatinine Ratio: 19 (ref 9–23)
BUN: 10 mg/dL (ref 6–20)
Bilirubin Total: 0.3 mg/dL (ref 0.0–1.2)
CO2: 21 mmol/L (ref 20–29)
Calcium: 9.8 mg/dL (ref 8.7–10.2)
Chloride: 97 mmol/L (ref 96–106)
Creatinine, Ser: 0.53 mg/dL — ABNORMAL LOW (ref 0.57–1.00)
GFR calc Af Amer: 144 mL/min/{1.73_m2} (ref >59)
GFR calc non Af Amer: 125 mL/min/{1.73_m2} (ref >59)
Globulin, Total: 2.8 g/dL (ref 1.5–4.5)
Glucose: 357 mg/dL — ABNORMAL HIGH (ref 65–99)
Potassium: 4.3 mmol/L (ref 3.5–5.2)
SODIUM: 135 mmol/L (ref 134–144)
Total Protein: 7.4 g/dL (ref 6.0–8.5)

## 2018-03-23 NOTE — Progress Notes (Signed)
Patient was educated on the use of the True Metrix blood glucose meter and Lantus pen. Reviewed necessary supplies and operation of the meter/pen. Also reviewed goal blood glucose levels. Patient was able to demonstrate use. All questions and concerns were addressed.

## 2018-04-06 ENCOUNTER — Telehealth: Payer: Self-pay

## 2018-04-06 NOTE — Telephone Encounter (Signed)
[employer] Daniella Id# [id number]   contacted pt to go over lab results pt didn't answer left a detailed vm informing pt of results and if she has any questions or concerns to give me a call   

## 2018-05-06 ENCOUNTER — Ambulatory Visit: Payer: Self-pay | Admitting: Internal Medicine

## 2020-06-25 ENCOUNTER — Other Ambulatory Visit: Payer: Self-pay

## 2020-06-25 ENCOUNTER — Ambulatory Visit (INDEPENDENT_AMBULATORY_CARE_PROVIDER_SITE_OTHER): Payer: Self-pay

## 2020-06-25 VITALS — BP 130/83 | HR 77 | Ht 60.0 in | Wt 178.1 lb

## 2020-06-25 DIAGNOSIS — Z3490 Encounter for supervision of normal pregnancy, unspecified, unspecified trimester: Secondary | ICD-10-CM

## 2020-06-25 DIAGNOSIS — Z3201 Encounter for pregnancy test, result positive: Secondary | ICD-10-CM

## 2020-06-25 LAB — POCT PREGNANCY, URINE: Preg Test, Ur: POSITIVE — AB

## 2020-06-25 MED ORDER — PREPLUS 27-1 MG PO TABS: 1.0000 | ORAL_TABLET | Freq: Every day | ORAL | 8 refills | Status: DC

## 2020-06-25 NOTE — Progress Notes (Signed)
Pt here today for UPT. UPT is positive in office. Pt has been seen by our providers in 2015 from previous pregnancies.  Pt states here for pregnancy confirmation from [facility]. Pt states is high risk from previous pregnancies and was told to come here for care.   LMP: unknown. Pt states maybe had cycle in March or in April 2022, pt unsure. Will have dating [place] set up for pt. Pt agreeable.   Pt denies any vaginal bleeding, cramps or abd pain. Pt is taking PNV. Pt would like Rx of PNV as well. Rx sent to pharmacy today.   [place] scheduled for 6/13 for 11am. Pt agreeable to date and time of appt.  Pt requesting pregnancy verification letter for Medicaid application. Given to pt today.    Judeth Cornfield, RN

## 2020-06-25 NOTE — Progress Notes (Signed)
Chart reviewed for nurse visit. Agree with plan of care.   Marny Lowenstein, PA-C 06/25/2020 4:12 PM

## 2020-07-02 ENCOUNTER — Ambulatory Visit
Admission: RE | Admit: 2020-07-02 | Discharge: 2020-07-02 | Disposition: A | Discharge status: Home / Self Care | Payer: Self-pay | Source: Ambulatory Visit | Attending: Medical | Admitting: Medical

## 2020-07-02 ENCOUNTER — Other Ambulatory Visit: Payer: Self-pay

## 2020-07-02 ENCOUNTER — Other Ambulatory Visit: Payer: Self-pay | Admitting: Medical

## 2020-07-02 ENCOUNTER — Telehealth: Payer: Self-pay | Admitting: Medical

## 2020-07-02 DIAGNOSIS — Z3490 Encounter for supervision of normal pregnancy, unspecified, unspecified trimester: Secondary | ICD-10-CM | POA: Insufficient documentation

## 2020-07-02 NOTE — Telephone Encounter (Signed)
Attempted to contact patient with normal [place] results as noted below. LM to return call to [facility] for results.   US OB Comp Less 14 Wks  Result Date: 07/02/2020 CLINICAL DATA:  Pregnancy dating EXAM: OBSTETRIC <14 WK ULTRASOUND TECHNIQUE: Transabdominal ultrasound was performed for evaluation of the gestation as well as the maternal uterus and adnexal regions. COMPARISON:  None. FINDINGS: Intrauterine gestational sac: Present Yolk sac:  Absent Embryo:  Present Cardiac Activity: Present Heart Rate: 157 bpm CRL:   68.8 mm   13 w 1 d                  [place] EDC: 01/06/2021 Subchorionic hemorrhage:  None visualized. Maternal uterus/adnexae: Within normal limits. IMPRESSION: Single live intrauterine gestation at 13 weeks 1 day Electronically Signed   By: Alcide Clever M.D.   On: 07/02/2020 11:28    Patient is not yet scheduled for new OB appointment, will need to be counseled on first trimester warning signs and available options for prenatal care.   Vonzella Nipple, PA-C 07/02/2020 4:01 PM

## 2020-10-11 ENCOUNTER — Encounter: Payer: Self-pay | Admitting: Obstetrics & Gynecology

## 2020-10-11 ENCOUNTER — Ambulatory Visit (INDEPENDENT_AMBULATORY_CARE_PROVIDER_SITE_OTHER): Payer: Self-pay | Admitting: Obstetrics & Gynecology

## 2020-10-11 ENCOUNTER — Other Ambulatory Visit: Payer: Self-pay

## 2020-10-11 ENCOUNTER — Other Ambulatory Visit (HOSPITAL_COMMUNITY)
Admission: RE | Admit: 2020-10-11 | Discharge: 2020-10-11 | Disposition: A | Discharge status: Home / Self Care | Payer: Self-pay | Source: Ambulatory Visit | Attending: Obstetrics & Gynecology | Admitting: Obstetrics & Gynecology

## 2020-10-11 DIAGNOSIS — O99212 Obesity complicating pregnancy, second trimester: Secondary | ICD-10-CM

## 2020-10-11 DIAGNOSIS — O24919 Unspecified diabetes mellitus in pregnancy, unspecified trimester: Secondary | ICD-10-CM

## 2020-10-11 DIAGNOSIS — I10 Essential (primary) hypertension: Secondary | ICD-10-CM | POA: Insufficient documentation

## 2020-10-11 DIAGNOSIS — O10912 Unspecified pre-existing hypertension complicating pregnancy, second trimester: Secondary | ICD-10-CM

## 2020-10-11 DIAGNOSIS — O10919 Unspecified pre-existing hypertension complicating pregnancy, unspecified trimester: Secondary | ICD-10-CM | POA: Insufficient documentation

## 2020-10-11 DIAGNOSIS — O24912 Unspecified diabetes mellitus in pregnancy, second trimester: Secondary | ICD-10-CM

## 2020-10-11 DIAGNOSIS — Z3A27 27 weeks gestation of pregnancy: Secondary | ICD-10-CM

## 2020-10-11 DIAGNOSIS — O99210 Obesity complicating pregnancy, unspecified trimester: Secondary | ICD-10-CM

## 2020-10-11 LAB — GLUCOSE, POCT (MANUAL RESULT ENTRY): POC Glucose: 197 mg/dl — AB (ref 70–99)

## 2020-10-11 MED ORDER — INSULIN NPH (HUMAN) (ISOPHANE) 100 UNIT/ML ~~LOC~~ SUSP: SUBCUTANEOUS | 3 refills | Status: DC

## 2020-10-11 MED ORDER — NIFEDIPINE ER OSMOTIC RELEASE 30 MG PO TB24: 30.0000 mg | ORAL_TABLET | Freq: Every day | ORAL | 2 refills | Status: DC

## 2020-10-11 MED ORDER — PROMETHAZINE HCL 25 MG PO TABS: 25.0000 mg | ORAL_TABLET | Freq: Four times a day (QID) | ORAL | 1 refills | Status: DC | PRN
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
## 2020-10-12 LAB — CERVICOVAGINAL ANCILLARY ONLY
Bacterial Vaginitis (gardnerella): NEGATIVE
Candida Glabrata: NEGATIVE
Candida Vaginitis: POSITIVE — AB
Chlamydia: NEGATIVE
Comment: NEGATIVE
Comment: NEGATIVE
Comment: NEGATIVE
Comment: NEGATIVE
Comment: NEGATIVE
Comment: NORMAL
Neisseria Gonorrhea: NEGATIVE
Trichomonas: NEGATIVE

## 2020-10-12 LAB — OBSTETRIC PANEL, INCLUDING HIV
Antibody Screen: NEGATIVE
Basophils Absolute: 0.1 10*3/uL (ref 0.0–0.2)
Basos: 1 %
EOS (ABSOLUTE): 0.5 10*3/uL — ABNORMAL HIGH (ref 0.0–0.4)
Eos: 5 %
HIV Screen 4th Generation wRfx: NONREACTIVE
Hematocrit: 43.3 % (ref 34.0–46.6)
Hemoglobin: 14.5 g/dL (ref 11.1–15.9)
Hepatitis B Surface Ag: NEGATIVE
Immature Grans (Abs): 0 10*3/uL (ref 0.0–0.1)
Immature Granulocytes: 0 %
Lymphocytes Absolute: 2.2 10*3/uL (ref 0.7–3.1)
Lymphs: 20 %
MCH: 31 pg (ref 26.6–33.0)
MCHC: 33.5 g/dL (ref 31.5–35.7)
MCV: 93 fL (ref 79–97)
Monocytes Absolute: 0.8 10*3/uL (ref 0.1–0.9)
Monocytes: 7 %
Neutrophils Absolute: 7.2 10*3/uL — ABNORMAL HIGH (ref 1.4–7.0)
Neutrophils: 67 %
Platelets: 285 10*3/uL (ref 150–450)
RBC: 4.67 x10E6/uL (ref 3.77–5.28)
RDW: 12.5 % (ref 11.7–15.4)
RPR Ser Ql: NONREACTIVE
Rh Factor: POSITIVE
Rubella Antibodies, IGG: 5.12 index (ref >0.99)
WBC: 10.8 10*3/uL (ref 3.4–10.8)

## 2020-10-12 LAB — COMPREHENSIVE METABOLIC PANEL
ALT: 11 IU/L (ref 0–32)
AST: 13 IU/L (ref 0–40)
Albumin/Globulin Ratio: 1.2 (ref 1.2–2.2)
Albumin: 3.3 g/dL — ABNORMAL LOW (ref 3.8–4.8)
Alkaline Phosphatase: 76 IU/L (ref 44–121)
BUN/Creatinine Ratio: 16 (ref 9–23)
BUN: 7 mg/dL (ref 6–20)
Bilirubin Total: <0.2 mg/dL (ref 0.0–1.2)
CO2: 18 mmol/L — ABNORMAL LOW (ref 20–29)
Calcium: 9.1 mg/dL (ref 8.7–10.2)
Chloride: 101 mmol/L (ref 96–106)
Creatinine, Ser: 0.45 mg/dL — ABNORMAL LOW (ref 0.57–1.00)
Globulin, Total: 2.8 g/dL (ref 1.5–4.5)
Glucose: 186 mg/dL — ABNORMAL HIGH (ref 65–99)
Potassium: 3.9 mmol/L (ref 3.5–5.2)
Sodium: 135 mmol/L (ref 134–144)
Total Protein: 6.1 g/dL (ref 6.0–8.5)
eGFR: 129 mL/min/{1.73_m2} (ref >59)

## 2020-10-15 LAB — CYTOLOGY - PAP
Comment: NEGATIVE
Diagnosis: NEGATIVE
High risk HPV: NEGATIVE

## 2020-10-23 ENCOUNTER — Other Ambulatory Visit: Payer: Self-pay

## 2020-10-23 ENCOUNTER — Encounter: Payer: Self-pay | Attending: Obstetrics & Gynecology | Admitting: Registered"

## 2020-10-23 ENCOUNTER — Ambulatory Visit: Payer: Self-pay | Admitting: Registered"

## 2020-10-23 DIAGNOSIS — Z3A00 Weeks of gestation of pregnancy not specified: Secondary | ICD-10-CM | POA: Insufficient documentation

## 2020-10-23 DIAGNOSIS — O24119 Pre-existing diabetes mellitus, type 2, in pregnancy, unspecified trimester: Secondary | ICD-10-CM | POA: Insufficient documentation

## 2020-10-23 DIAGNOSIS — O24919 Unspecified diabetes mellitus in pregnancy, unspecified trimester: Secondary | ICD-10-CM

## 2020-10-23 DIAGNOSIS — O219 Vomiting of pregnancy, unspecified: Secondary | ICD-10-CM

## 2020-10-23 MED ORDER — ONDANSETRON 4 MG PO TBDP: 4.0000 mg | ORAL_TABLET | Freq: Four times a day (QID) | ORAL | 0 refills | Status: DC | PRN

## 2020-10-23 NOTE — Progress Notes (Signed)
Medical Nutrition Therapy: Type 2 Diabetes in pregnancy EDD: 01/06/21; [redacted]w[redacted]d  [employer] Video interpreter Gabriel Rung #[id number]  Appointment Start time:  1030  Appointment End time:  1130  Primary concerns today: Whole body feels weak, N/V, headaches  Referral diagnosis: (HTN?)   NUTRITION ASSESSMENT   Anthropometrics  Wt Readings from Last 3 Encounters:  10/11/20 187 lb (84.8 kg)  06/25/20 178 lb 1.6 oz (80.8 kg)  03/19/18 180 lb (81.6 kg)      Clinical Medical Hx: T2DM (diagnosed 2015 per patient), cholestasis, appendectomy per patient but not in chart. Patient also was concerned regarding fatty liver Medications: Zofran, phenergan, prenatal vitamin NPH 20 [place] breakfast; 10 units hs (patient not using as prescribed, injects only in morning ~1 hr before breakfast, first stated 1 unit, then said more or less 15 units),  Labs: A1c: 02/2018 11.5%; 04/02/2017 6.2% Notable Signs/Symptoms: UTI symptoms, frequent urination (20x/day), weakness, headaches. N/V and phenergan not helping  SMBG: (patient reported) FBS: 180-190 mg/dL; PPBG 433-295 mg/dL   Lifestyle & Dietary Hx Patient states she is concerned about frequent N/V which has been present most of pregnancy. Pt reports medications she has tried so far have not helped. Pt states ginger tea helps a little.  Patient states she learned to use insulin in 2015 with her last pregnancy. Pt states she was using insulin x1 year and stopped because she felt it was not good for her because she believed it was causing cloudy vision.  Pt states she started injecting insulin 1x/day about 2 weeks ago. Pt states she used to use vials but now has capsules and doesn't draw up insulin dose. Patient was instructed to bring blood sugar log and medication to her next appointment in 2 days.  Pt states she was told to take metformin, but after taking 500 mg of metformin she states it made her "hot" so stopped taking it, also described burning sensation with urination.  (Patient may be associating UTI symptoms with taking metformin?) Pt states she was given medication for those symptoms but didn't think they worked so started using a cream that feels it helps a little.  24-Hr Dietary Recall First Meal: cheerios, milk, fruit, juice or water OR 3-4 tortillas and eggs Snack:  Second Meal: meat, 1 1/2 c rice, salad OR beans, eggs, tortillas; whatever meal she has it is followed by apple, orange, banana, yogurt Snack:  Third Meal: spaghetti, tomato, cheese, tortilla OR watercress, soy, tortillas Snack:  Beverages: water, juice, ginger tea with honey  NUTRITION INTERVENTION  Diabetes education (E-1) on the following topics:  Appropriate amount of rice and tortillas Avoid juice  Handouts Provided Include  Diabetes in Pregnancy (Spanish) Carb counting sheet (Spanish)  Learning Style & Readiness for Change Teaching method utilized: Visual & Auditory  Demonstrated degree of understanding via: Teach Back  Barriers to learning/adherence to lifestyle change: fear medication making symptoms worse  Goals Established by Pt Look over the handouts Reduce rice and tortillas in diet, avoid juice and sweetened beverages Return in 2 days with blood sugar log and medications

## 2020-10-23 NOTE — Progress Notes (Signed)
Amb

## 2020-10-25 ENCOUNTER — Other Ambulatory Visit: Payer: Self-pay

## 2020-10-30 ENCOUNTER — Telehealth: Payer: Self-pay | Admitting: *Deleted

## 2020-10-30 NOTE — Telephone Encounter (Signed)
TC to patient using Spanish interpreter. No answer. VM for patient to call office regarding insulin. Message forwarded to Dr. Clearance Coots who will see the patient at her 11/01/20 appt.

## 2020-11-01 ENCOUNTER — Encounter: Payer: Self-pay | Admitting: Obstetrics

## 2020-11-01 ENCOUNTER — Encounter (HOSPITAL_COMMUNITY): Payer: Self-pay | Admitting: Obstetrics and Gynecology

## 2020-11-01 ENCOUNTER — Inpatient Hospital Stay (HOSPITAL_COMMUNITY)
Admission: AD | Admit: 2020-11-01 | Discharge: 2020-11-20 | DRG: 787 | Disposition: A | Discharge status: Home / Self Care | Payer: Medicaid Other | Attending: Obstetrics and Gynecology | Admitting: Obstetrics and Gynecology

## 2020-11-01 ENCOUNTER — Encounter: Payer: Self-pay | Admitting: Obstetrics and Gynecology

## 2020-11-01 ENCOUNTER — Other Ambulatory Visit: Payer: Self-pay | Admitting: *Deleted

## 2020-11-01 ENCOUNTER — Encounter (HOSPITAL_COMMUNITY): Payer: Self-pay | Admitting: Family Medicine

## 2020-11-01 ENCOUNTER — Ambulatory Visit (INDEPENDENT_AMBULATORY_CARE_PROVIDER_SITE_OTHER): Payer: Self-pay | Admitting: Obstetrics and Gynecology

## 2020-11-01 ENCOUNTER — Other Ambulatory Visit: Payer: Self-pay

## 2020-11-01 ENCOUNTER — Inpatient Hospital Stay (HOSPITAL_BASED_OUTPATIENT_CLINIC_OR_DEPARTMENT_OTHER): Payer: Medicaid Other

## 2020-11-01 VITALS — BP 187/126 | HR 101 | Wt 191.0 lb

## 2020-11-01 DIAGNOSIS — O365930 Maternal care for other known or suspected poor fetal growth, third trimester, not applicable or unspecified: Secondary | ICD-10-CM | POA: Diagnosis not present

## 2020-11-01 DIAGNOSIS — Z3A30 30 weeks gestation of pregnancy: Secondary | ICD-10-CM | POA: Diagnosis not present

## 2020-11-01 DIAGNOSIS — K219 Gastro-esophageal reflux disease without esophagitis: Secondary | ICD-10-CM | POA: Diagnosis not present

## 2020-11-01 DIAGNOSIS — O1002 Pre-existing essential hypertension complicating childbirth: Secondary | ICD-10-CM | POA: Diagnosis present

## 2020-11-01 DIAGNOSIS — O10919 Unspecified pre-existing hypertension complicating pregnancy, unspecified trimester: Secondary | ICD-10-CM | POA: Diagnosis not present

## 2020-11-01 DIAGNOSIS — Z23 Encounter for immunization: Secondary | ICD-10-CM | POA: Diagnosis not present

## 2020-11-01 DIAGNOSIS — E669 Obesity, unspecified: Secondary | ICD-10-CM | POA: Diagnosis not present

## 2020-11-01 DIAGNOSIS — O2412 Pre-existing diabetes mellitus, type 2, in childbirth: Secondary | ICD-10-CM | POA: Diagnosis present

## 2020-11-01 DIAGNOSIS — O365920 Maternal care for other known or suspected poor fetal growth, second trimester, not applicable or unspecified: Secondary | ICD-10-CM

## 2020-11-01 DIAGNOSIS — O1413 Severe pre-eclampsia, third trimester: Secondary | ICD-10-CM

## 2020-11-01 DIAGNOSIS — O0990 Supervision of high risk pregnancy, unspecified, unspecified trimester: Secondary | ICD-10-CM

## 2020-11-01 DIAGNOSIS — O99214 Obesity complicating childbirth: Secondary | ICD-10-CM | POA: Diagnosis present

## 2020-11-01 DIAGNOSIS — O24112 Pre-existing diabetes mellitus, type 2, in pregnancy, second trimester: Secondary | ICD-10-CM | POA: Diagnosis not present

## 2020-11-01 DIAGNOSIS — Z3689 Encounter for other specified antenatal screening: Secondary | ICD-10-CM

## 2020-11-01 DIAGNOSIS — Z3A32 32 weeks gestation of pregnancy: Secondary | ICD-10-CM | POA: Diagnosis not present

## 2020-11-01 DIAGNOSIS — O10913 Unspecified pre-existing hypertension complicating pregnancy, third trimester: Secondary | ICD-10-CM | POA: Diagnosis not present

## 2020-11-01 DIAGNOSIS — O99213 Obesity complicating pregnancy, third trimester: Secondary | ICD-10-CM | POA: Diagnosis not present

## 2020-11-01 DIAGNOSIS — E1165 Type 2 diabetes mellitus with hyperglycemia: Secondary | ICD-10-CM | POA: Diagnosis present

## 2020-11-01 DIAGNOSIS — O114 Pre-existing hypertension with pre-eclampsia, complicating childbirth: Secondary | ICD-10-CM | POA: Diagnosis present

## 2020-11-01 DIAGNOSIS — O9962 Diseases of the digestive system complicating childbirth: Secondary | ICD-10-CM | POA: Diagnosis present

## 2020-11-01 DIAGNOSIS — E119 Type 2 diabetes mellitus without complications: Secondary | ICD-10-CM

## 2020-11-01 DIAGNOSIS — O133 Gestational [pregnancy-induced] hypertension without significant proteinuria, third trimester: Secondary | ICD-10-CM | POA: Diagnosis not present

## 2020-11-01 DIAGNOSIS — O99210 Obesity complicating pregnancy, unspecified trimester: Secondary | ICD-10-CM | POA: Diagnosis not present

## 2020-11-01 DIAGNOSIS — O24919 Unspecified diabetes mellitus in pregnancy, unspecified trimester: Secondary | ICD-10-CM

## 2020-11-01 DIAGNOSIS — I10 Essential (primary) hypertension: Secondary | ICD-10-CM | POA: Diagnosis present

## 2020-11-01 DIAGNOSIS — O1414 Severe pre-eclampsia complicating childbirth: Secondary | ICD-10-CM | POA: Diagnosis not present

## 2020-11-01 DIAGNOSIS — Z789 Other specified health status: Secondary | ICD-10-CM

## 2020-11-01 DIAGNOSIS — R188 Other ascites: Secondary | ICD-10-CM | POA: Diagnosis present

## 2020-11-01 DIAGNOSIS — Z794 Long term (current) use of insulin: Secondary | ICD-10-CM

## 2020-11-01 DIAGNOSIS — O119 Pre-existing hypertension with pre-eclampsia, unspecified trimester: Secondary | ICD-10-CM

## 2020-11-01 DIAGNOSIS — O99892 Other specified diseases and conditions complicating childbirth: Secondary | ICD-10-CM | POA: Diagnosis not present

## 2020-11-01 DIAGNOSIS — O1410 Severe pre-eclampsia, unspecified trimester: Secondary | ICD-10-CM

## 2020-11-01 DIAGNOSIS — Z20822 Contact with and (suspected) exposure to covid-19: Secondary | ICD-10-CM | POA: Diagnosis not present

## 2020-11-01 DIAGNOSIS — O24113 Pre-existing diabetes mellitus, type 2, in pregnancy, third trimester: Secondary | ICD-10-CM | POA: Diagnosis not present

## 2020-11-01 DIAGNOSIS — O24119 Pre-existing diabetes mellitus, type 2, in pregnancy, unspecified trimester: Secondary | ICD-10-CM | POA: Diagnosis not present

## 2020-11-01 DIAGNOSIS — O288 Other abnormal findings on antenatal screening of mother: Secondary | ICD-10-CM

## 2020-11-01 DIAGNOSIS — O09523 Supervision of elderly multigravida, third trimester: Secondary | ICD-10-CM | POA: Diagnosis not present

## 2020-11-01 HISTORY — DX: Severe pre-eclampsia, unspecified trimester: O14.10

## 2020-11-01 LAB — PROTEIN / CREATININE RATIO, URINE
Creatinine, Urine: 102.16 mg/dL
Protein Creatinine Ratio: 15.4 mg/mg{Cre} — ABNORMAL HIGH (ref 0.00–0.15)
Total Protein, Urine: 1573 mg/dL

## 2020-11-01 LAB — COMPREHENSIVE METABOLIC PANEL
ALT: 20 U/L (ref 0–44)
AST: 37 U/L (ref 15–41)
Albumin: 2.1 g/dL — ABNORMAL LOW (ref 3.5–5.0)
Alkaline Phosphatase: 71 U/L (ref 38–126)
Anion gap: 8 (ref 5–15)
BUN: 8 mg/dL (ref 6–20)
CO2: 22 mmol/L (ref 22–32)
Calcium: 8.2 mg/dL — ABNORMAL LOW (ref 8.9–10.3)
Chloride: 104 mmol/L (ref 98–111)
Creatinine, Ser: 0.61 mg/dL (ref 0.44–1.00)
GFR, Estimated: >60 mL/min (ref >60)
Glucose, Bld: 148 mg/dL — ABNORMAL HIGH (ref 70–99)
Potassium: 3.9 mmol/L (ref 3.5–5.1)
Sodium: 134 mmol/L — ABNORMAL LOW (ref 135–145)
Total Bilirubin: 0.3 mg/dL (ref 0.3–1.2)
Total Protein: 5.4 g/dL — ABNORMAL LOW (ref 6.5–8.1)

## 2020-11-01 LAB — CBC
HCT: 42.6 % (ref 36.0–46.0)
Hemoglobin: 14.8 g/dL (ref 12.0–15.0)
MCH: 30.6 pg (ref 26.0–34.0)
MCHC: 34.7 g/dL (ref 30.0–36.0)
MCV: 88.2 fL (ref 80.0–100.0)
Platelets: 279 10*3/uL (ref 150–400)
RBC: 4.83 MIL/uL (ref 3.87–5.11)
RDW: 12.3 % (ref 11.5–15.5)
WBC: 10.4 10*3/uL (ref 4.0–10.5)
nRBC: 0 % (ref 0.0–0.2)

## 2020-11-01 LAB — GLUCOSE, CAPILLARY
Glucose-Capillary: 155 mg/dL — ABNORMAL HIGH (ref 70–99)
Glucose-Capillary: 338 mg/dL — ABNORMAL HIGH (ref 70–99)

## 2020-11-01 LAB — RESP PANEL BY RT-PCR (FLU A&B, COVID) ARPGX2
Influenza A by PCR: NEGATIVE
Influenza B by PCR: NEGATIVE
SARS Coronavirus 2 by RT PCR: NEGATIVE

## 2020-11-01 LAB — TYPE AND SCREEN
ABO/RH(D): O POS
Antibody Screen: NEGATIVE

## 2020-11-01 MED ORDER — INSULIN NPH (HUMAN) (ISOPHANE) 100 UNIT/ML ~~LOC~~ SUSP
15.0000 [IU] | Freq: Two times a day (BID) | SUBCUTANEOUS | Status: DC
  Administered 2020-11-01 – 2020-11-02 (×2): 15 [IU] via SUBCUTANEOUS
  Filled 2020-11-01: qty 10

## 2020-11-01 MED ORDER — LACTATED RINGERS IV SOLN: INTRAVENOUS | Status: DC

## 2020-11-01 MED ORDER — INSULIN ASPART 100 UNIT/ML IJ SOLN
0.0000 [IU] | Freq: Three times a day (TID) | INTRAMUSCULAR | Status: DC
  Administered 2020-11-01: 18 [IU] via SUBCUTANEOUS
  Administered 2020-11-01: 4 [IU] via SUBCUTANEOUS
  Administered 2020-11-02: 18 [IU] via SUBCUTANEOUS
  Administered 2020-11-02: 9 [IU] via SUBCUTANEOUS
  Administered 2020-11-02: 15 [IU] via SUBCUTANEOUS
  Administered 2020-11-03: 3 [IU] via SUBCUTANEOUS
  Administered 2020-11-03: 9 [IU] via SUBCUTANEOUS
  Administered 2020-11-03: 15 [IU] via SUBCUTANEOUS
  Administered 2020-11-04: 4 [IU] via SUBCUTANEOUS
  Administered 2020-11-04: 3 [IU] via SUBCUTANEOUS
  Administered 2020-11-04: 4 [IU] via SUBCUTANEOUS
  Administered 2020-11-05: 3 [IU] via SUBCUTANEOUS
  Administered 2020-11-05: 4 [IU] via SUBCUTANEOUS

## 2020-11-01 MED ORDER — LABETALOL HCL 5 MG/ML IV SOLN
40.0000 mg | INTRAVENOUS | Status: DC | PRN
  Administered 2020-11-04 – 2020-11-10 (×5): 40 mg via INTRAVENOUS
  Filled 2020-11-01 (×5): qty 8

## 2020-11-01 MED ORDER — MAGNESIUM SULFATE BOLUS VIA INFUSION
4.0000 g | Freq: Once | INTRAVENOUS | Status: AC
  Administered 2020-11-01: 4 g via INTRAVENOUS
  Filled 2020-11-01: qty 1000

## 2020-11-01 MED ORDER — ONDANSETRON 4 MG PO TBDP
8.0000 mg | ORAL_TABLET | Freq: Once | ORAL | Status: AC
  Administered 2020-11-01: 8 mg via ORAL
  Filled 2020-11-01: qty 2

## 2020-11-01 MED ORDER — ACETAMINOPHEN 500 MG PO TABS
1000.0000 mg | ORAL_TABLET | Freq: Once | ORAL | Status: AC
  Administered 2020-11-01: 1000 mg via ORAL
  Filled 2020-11-01: qty 2

## 2020-11-01 MED ORDER — NIFEDIPINE ER OSMOTIC RELEASE 30 MG PO TB24
60.0000 mg | ORAL_TABLET | Freq: Once | ORAL | Status: AC
  Administered 2020-11-01: 60 mg via ORAL
  Filled 2020-11-01: qty 2

## 2020-11-01 MED ORDER — HYDRALAZINE HCL 20 MG/ML IJ SOLN
10.0000 mg | INTRAMUSCULAR | Status: DC | PRN
  Administered 2020-11-04 – 2020-11-05 (×2): 10 mg via INTRAVENOUS
  Filled 2020-11-01 (×4): qty 1

## 2020-11-01 MED ORDER — LABETALOL HCL 5 MG/ML IV SOLN
80.0000 mg | INTRAVENOUS | Status: DC | PRN
  Administered 2020-11-01: 80 mg via INTRAVENOUS
  Filled 2020-11-01: qty 16

## 2020-11-01 MED ORDER — ACETAMINOPHEN 325 MG PO TABS
650.0000 mg | ORAL_TABLET | ORAL | Status: DC | PRN
  Administered 2020-11-01 – 2020-11-17 (×11): 650 mg via ORAL
  Filled 2020-11-01 (×11): qty 2

## 2020-11-01 MED ORDER — INSULIN ASPART 100 UNIT/ML IJ SOLN
10.0000 [IU] | Freq: Three times a day (TID) | INTRAMUSCULAR | Status: DC
  Administered 2020-11-01 – 2020-11-02 (×2): 10 [IU] via SUBCUTANEOUS

## 2020-11-01 MED ORDER — MAGNESIUM SULFATE 40 GM/1000ML IV SOLN
2.0000 g/h | INTRAVENOUS | Status: DC
  Administered 2020-11-01: 2 g/h via INTRAVENOUS

## 2020-11-01 MED ORDER — BETAMETHASONE SOD PHOS & ACET 6 (3-3) MG/ML IJ SUSP
12.0000 mg | INTRAMUSCULAR | Status: AC
  Administered 2020-11-01 – 2020-11-02 (×2): 12 mg via INTRAMUSCULAR
  Filled 2020-11-01: qty 5

## 2020-11-01 MED ORDER — DOCUSATE SODIUM 100 MG PO CAPS
100.0000 mg | ORAL_CAPSULE | Freq: Every day | ORAL | Status: DC
  Administered 2020-11-02 – 2020-11-17 (×16): 100 mg via ORAL
  Filled 2020-11-01 (×16): qty 1

## 2020-11-01 MED ORDER — LABETALOL HCL 5 MG/ML IV SOLN
40.0000 mg | INTRAVENOUS | Status: DC | PRN
  Administered 2020-11-01: 40 mg via INTRAVENOUS
  Filled 2020-11-01: qty 8

## 2020-11-01 MED ORDER — CALCIUM CARBONATE ANTACID 500 MG PO CHEW: 2.0000 | CHEWABLE_TABLET | ORAL | Status: DC | PRN

## 2020-11-01 MED ORDER — HYDRALAZINE HCL 20 MG/ML IJ SOLN: 10.0000 mg | INTRAMUSCULAR | Status: DC | PRN

## 2020-11-01 MED ORDER — ZOLPIDEM TARTRATE 5 MG PO TABS: 5.0000 mg | ORAL_TABLET | Freq: Every evening | ORAL | Status: DC | PRN

## 2020-11-01 MED ORDER — LABETALOL HCL 5 MG/ML IV SOLN
80.0000 mg | INTRAVENOUS | Status: DC | PRN
  Administered 2020-11-04 – 2020-11-05 (×2): 80 mg via INTRAVENOUS
  Filled 2020-11-01 (×4): qty 16

## 2020-11-01 MED ORDER — LABETALOL HCL 5 MG/ML IV SOLN
20.0000 mg | INTRAVENOUS | Status: DC | PRN
  Administered 2020-11-01: 20 mg via INTRAVENOUS
  Filled 2020-11-01: qty 4

## 2020-11-01 MED ORDER — LABETALOL HCL 5 MG/ML IV SOLN
20.0000 mg | INTRAVENOUS | Status: DC | PRN
  Administered 2020-11-04 – 2020-11-10 (×5): 20 mg via INTRAVENOUS
  Filled 2020-11-01 (×5): qty 4

## 2020-11-01 MED ORDER — PRENATAL MULTIVITAMIN CH
1.0000 | ORAL_TABLET | Freq: Every day | ORAL | Status: DC
  Administered 2020-11-02 – 2020-11-17 (×16): 1 via ORAL
  Filled 2020-11-01 (×16): qty 1

## 2020-11-01 NOTE — Progress Notes (Signed)
+   Fetal movement. Pt presents with elevated BP. Right arm 187/126, Left arm 190/115. Pt also c/o HA, RUQ pain, N/V, and SOB. States she has been feeling bad all week. She denies any visual changes. Pt states she has been taking her Nifedipine daily and took her last dosage this morning at 6am.   Pt was directed to MAU. MAU notified.

## 2020-11-01 NOTE — Progress Notes (Signed)
U/s order placed today as it should have been ordered as [place] [facility] Detailed.

## 2020-11-01 NOTE — Progress Notes (Signed)
Inpatient Diabetes Program Recommendations  Diabetes Treatment Program Recommendations  ADA Standards of Care 2018 Diabetes in Pregnancy Target Glucose Ranges:  Fasting: 60 - 90 mg/dL Preprandial: 60 - 096 mg/dL 1 hr postprandial: Less than 140mg /dL (from first bite of meal) 2 hr postprandial: Less than 120 mg/dL (from first bite of meal)      Lab Results  Component Value Date   GLUCAP 299 (H) 12/20/2017   HGBA1C 13.1 (A) 03/19/2018    Review of Glycemic Control Results for Martha [name], Martha [name] (MRN [name]) as of 11/01/2020 15:56  Ref. Range 11/01/2020 12:51  Glucose Latest Ref Range: 70 - 99 mg/dL 11/03/2020 (H)   Diabetes history: Type 2 DM Outpatient Diabetes medications: NPH 20 units QM, 10 units QHS Current orders for Inpatient glycemic control: NPH 15 units BID, Novolog 0-24 units TID, Novolog 10 units TID BMZ x 1(2)  Inpatient Diabetes Program Recommendations:    Noted consult. In agreement. Will plan to see patient 10/14.  Thanks, 11/14, MSN, RNC-OB Diabetes Coordinator [phone] (8a-5p)

## 2020-11-01 NOTE — MAU Note (Signed)
Sent from office for elevated b/p and headache.pt had upper abd an n/v this morning but that is gone now except for burning pain around her umbilicus. Good fetal movement felt. Reports some lower abd cramping on and off. Pt reports white/cottage cheese vaginal discharge that she has had for about a month. Has taken cream and pills but it is not getting better.

## 2020-11-01 NOTE — H&P (Signed)
[facility] ANTEPARTUM ADMISSION HISTORY AND PHYSICAL NOTE   History of Present Illness: Martha [name] is a 35 y.o. [phone] at [redacted]w[redacted]d admitted for chronic hypertension with superimposed preeclampsia. PMHx significant for chronic hypertension & T2DM. She is late to care with her initial prenatal visit on 9/22 at 27 wks.  She has been taking procardia xl 30 mg QD and reports no missed doses. Had a BP check in the office today with severe range BPs. Reports headache, epigastric pain, & n/v. Headache & epigastric pain improved with tylenol in MAU. Severe range BPs were treated with 3 doses of IV labetalol (20, 40, 80). LFT & platelets are normal but protein creatinine ratio is 15.4.  Patient reports the fetal movement as active. Patient reports uterine contraction  activity as none. Patient reports  vaginal bleeding as none. Patient describes fluid per vagina as None. Fetal presentation is  unknown .  Patient Active Problem List   Diagnosis Date Noted   Supervision of high risk pregnancy, antepartum 11/01/2020   Chronic hypertension affecting pregnancy 10/11/2020   Positive for macroalbuminuria 03/22/2018   Diabetes mellitus type 2, uncontrolled, with complications 03/19/2018   Essential hypertension 03/19/2018   Obesity affecting pregnancy, antepartum 03/19/2018   Diabetes mellitus affecting pregnancy, antepartum 08/10/2013   Language barrier 08/10/2013   Obesity 08/10/2013   History of depression 08/10/2013    Past Medical History:  Diagnosis Date   Diabetes mellitus type 2, uncontrolled, with complications 03/19/2018   Essential hypertension 03/19/2018   Kidney infection 0105/2017    Past Surgical History:  Procedure Laterality Date   APPENDECTOMY     CHOLECYSTECTOMY      OB History  Gravida Para Term Preterm AB Living  5 4 3 1   4   SAB IAB Ectopic Multiple Live Births          4    # Outcome Date GA Lbr Len/2nd Weight Sex Delivery Anes PTL Lv  5 Current            4 Preterm 11/21/13 [redacted]w[redacted]d 01:00 / 00:03 3609 g M Vag-Spont Local  LIV     Birth Comments: On phototherapy for jaundice  3 Term 01/28/08 [redacted]w[redacted]d  3175 g M Vag-Spont None  LIV  2 Term 08/02/03 [redacted]w[redacted]d  3317 g M Vag-Spont None  LIV  1 Term 11/02/99 [redacted]w[redacted]d  2722 g F Vag-Spont None  LIV    Social History   Socioeconomic History   Marital status: Single    Spouse name: Not on file   Number of children: Not on file   Years of education: never went to school.   Highest education level: Not on file  Occupational History   Not on file  Tobacco Use   Smoking status: Never   Smokeless tobacco: Never  Vaping Use   Vaping Use: Never used  Substance and Sexual Activity   Alcohol use: No   Drug use: No   Sexual activity: Yes    Birth control/protection: None  Other Topics Concern   Not on file  Social History Narrative   Not on file   Social Determinants of Health   Financial Resource Strain: Not on file  Food Insecurity: Not on file  Transportation Needs: Not on file  Physical Activity: Not on file  Stress: Not on file  Social Connections: Not on file    Family History  Problem Relation Age of Onset   Diabetes Mother    Hypertension Mother    Heart  disease Father    Mental retardation Sister     Allergies  Allergen Reactions   Influenza Vaccines Itching and Rash    Medications Prior to Admission  Medication Sig Dispense Refill Last Dose   insulin NPH Human (NOVOLIN N) 100 UNIT/ML injection 20 units [place] ac breakfast and 10 units HS 10 mL 3 11/01/2020   NIFEdipine (PROCARDIA XL) 30 MG 24 hr tablet Take 1 tablet (30 mg total) by mouth daily. 30 tablet 2 11/01/2020   ondansetron (ZOFRAN ODT) 4 MG disintegrating tablet Take 1 tablet (4 mg total) by mouth every 6 (six) hours as needed for nausea. 20 tablet 0 11/01/2020   Prenatal Vit-Fe Fumarate-FA (PREPLUS) 27-1 MG TABS Take 1 tablet by mouth daily. 30 tablet 8 11/01/2020   promethazine (PHENERGAN) 25 MG tablet Take 1 tablet (25  mg total) by mouth every 6 (six) hours as needed for nausea or vomiting. 30 tablet 1 Past Month    Review of Systems - Review of Systems  Constitutional: Negative.   Eyes:  Negative for blurred vision.  Gastrointestinal:  Positive for nausea.  Neurological:  Positive for headaches.    Vitals:  BP (!) 151/100   Pulse 85   Temp 98.6 F (37 C)   Resp 18   LMP 04/01/2020   SpO2 99%   Temp:  [98.6 F (37 C)] 98.6 F (37 C) (10/13 1228) Pulse Rate:  [83-101] 85 (10/13 1530) Resp:  [18] 18 (10/13 1228) BP: (137-190)/(84-126) 151/100 (10/13 1530) SpO2:  [99 %] 99 % (10/13 1530) Weight:  [86.6 kg] 86.6 kg (10/13 1142)  Physical Examination: CONSTITUTIONAL: Well-developed, well-nourished female in no acute distress.  HENT:  Normocephalic, atraumatic, External right and left ear normal. Oropharynx is clear and moist EYES: Conjunctivae and EOM are normal. Pupils are equal, round, and reactive to light. No scleral icterus.  NECK: Normal range of motion, supple, no masses SKIN: Skin is warm and dry. No rash noted. Not diaphoretic. No erythema. No pallor. NEUROLGIC: Alert and oriented to person, place, and time. Normal reflexes, muscle tone coordination. No cranial nerve deficit noted. PSYCHIATRIC: Normal mood and affect. Normal behavior. Normal judgment and thought content. CARDIOVASCULAR: Normal heart rate noted, regular rhythm RESPIRATORY: Effort and breath sounds normal, no problems with respiration noted ABDOMEN: Soft, nontender, nondistended, gravid. MUSCULOSKELETAL: Normal range of motion. No edema and no tenderness. 2+ distal pulses.  Cervix:  not indicated Membranes:intact Fetal Monitoring:Baseline: 140 bpm, Variability: Good {> 6 bpm), Accelerations: 10x10, and Decelerations: Variable: mild Tocometer: Flat  Labs:  Results for orders placed or performed during the hospital encounter of 11/01/20 (from the past 24 hour(s))  Protein / creatinine ratio, urine   Collection Time:  11/01/20 12:16 PM  Result Value Ref Range   Creatinine, Urine 102.16 mg/dL   Total Protein, Urine 1,573 mg/dL   Protein Creatinine Ratio 15.40 (H) 0.00 - 0.15 mg/mg[Cre]  Comprehensive metabolic panel   Collection Time: 11/01/20 12:51 PM  Result Value Ref Range   Sodium 134 (L) 135 - 145 mmol/L   Potassium 3.9 3.5 - 5.1 mmol/L   Chloride 104 98 - 111 mmol/L   CO2 22 22 - 32 mmol/L   Glucose, Bld 148 (H) 70 - 99 mg/dL   BUN 8 6 - 20 mg/dL   Creatinine, Ser 0.63 0.44 - 1.00 mg/dL   Calcium 8.2 (L) 8.9 - 10.3 mg/dL   Total Protein 5.4 (L) 6.5 - 8.1 g/dL   Albumin 2.1 (L) 3.5 - 5.0 g/dL  AST 37 15 - 41 U/L   ALT 20 0 - 44 U/L   Alkaline Phosphatase 71 38 - 126 U/L   Total Bilirubin 0.3 0.3 - 1.2 mg/dL   GFR, Estimated >17 >51 mL/min   Anion gap 8 5 - 15  CBC   Collection Time: 11/01/20 12:51 PM  Result Value Ref Range   WBC 10.4 4.0 - 10.5 K/uL   RBC 4.83 3.87 - 5.11 MIL/uL   Hemoglobin 14.8 12.0 - 15.0 g/dL   HCT 02.5 85.2 - 77.8 %   MCV 88.2 80.0 - 100.0 fL   MCH 30.6 26.0 - 34.0 pg   MCHC 34.7 30.0 - 36.0 g/dL   RDW 24.2 35.3 - 61.4 %   Platelets 279 150 - 400 K/uL   nRBC 0.0 0.0 - 0.2 %    Imaging Studies: No results found.   Assessment and Plan: 1. Chronic hypertension with superimposed pre-eclampsia  -mag sulfate 4/2 -increase procardia to 60 mg daily -anatomy ultrasound ordered  2. Diabetes mellitus affecting pregnancy, antepartum  -insulin NPH 15 U BID, aspart 10 U TID -sliding scale  3. [redacted] weeks gestation of pregnancy  -BMZ -s/w Dr. Algernon Huxley ([place]) aware of admission    Judeth Horn, NP 11/01/2020 3:48 PM

## 2020-11-02 DIAGNOSIS — O1413 Severe pre-eclampsia, third trimester: Secondary | ICD-10-CM

## 2020-11-02 DIAGNOSIS — O24119 Pre-existing diabetes mellitus, type 2, in pregnancy, unspecified trimester: Secondary | ICD-10-CM

## 2020-11-02 LAB — GLUCOSE, CAPILLARY
Glucose-Capillary: 293 mg/dL — ABNORMAL HIGH (ref 70–99)
Glucose-Capillary: 298 mg/dL — ABNORMAL HIGH (ref 70–99)
Glucose-Capillary: 228 mg/dL — ABNORMAL HIGH (ref 70–99)
Glucose-Capillary: 329 mg/dL — ABNORMAL HIGH (ref 70–99)

## 2020-11-02 MED ORDER — NIFEDIPINE ER OSMOTIC RELEASE 60 MG PO TB24
60.0000 mg | ORAL_TABLET | Freq: Every day | ORAL | Status: DC
  Administered 2020-11-02 – 2020-11-06 (×5): 60 mg via ORAL
  Filled 2020-11-02 (×5): qty 1

## 2020-11-02 MED ORDER — INSULIN NPH (HUMAN) (ISOPHANE) 100 UNIT/ML ~~LOC~~ SUSP: 20.0000 [IU] | Freq: Two times a day (BID) | SUBCUTANEOUS | Status: DC

## 2020-11-02 MED ORDER — INSULIN ASPART 100 UNIT/ML IJ SOLN
15.0000 [IU] | Freq: Three times a day (TID) | INTRAMUSCULAR | Status: DC
  Administered 2020-11-02 – 2020-11-03 (×3): 15 [IU] via SUBCUTANEOUS

## 2020-11-02 MED ORDER — INSULIN NPH (HUMAN) (ISOPHANE) 100 UNIT/ML ~~LOC~~ SUSP
25.0000 [IU] | Freq: Two times a day (BID) | SUBCUTANEOUS | Status: DC
  Administered 2020-11-02 – 2020-11-03 (×2): 25 [IU] via SUBCUTANEOUS

## 2020-11-02 MED ORDER — ONDANSETRON HCL 4 MG/2ML IJ SOLN
4.0000 mg | Freq: Three times a day (TID) | INTRAMUSCULAR | Status: DC | PRN
  Administered 2020-11-15: 4 mg via INTRAVENOUS
  Filled 2020-11-02 (×2): qty 2
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
## 2020-11-02 NOTE — Progress Notes (Signed)
Nutrition Consult for DM diet education acknowledged. Consult will be completed when RD back in house on 10/17. Pt with previous DM education on 10/23/20

## 2020-11-02 NOTE — Progress Notes (Signed)
Patient ID: Martha [name], female   DOB: 05-18-1985, 35 y.o.   MRN: [id number] FACULTY PRACTICE ANTEPARTUM(COMPREHENSIVE) NOTE  BOWEN BONTON [name] is a 35 y.o. [phone] at [redacted]w[redacted]d  who is admitted for inpatient monitoring of CHTN with severe preeclampsia based on BP, HA and nausea and emesis.    Fetal presentation is cephalic. Length of Stay:  1  Days  Date of admission:11/01/2020  Subjective: Patient reports feeling well this morning with persistent headache and nausea although improved since admission Patient reports the fetal movement as active. Patient reports uterine contraction  activity as none. Patient reports  vaginal bleeding as none. Patient describes fluid per vagina as None.  Vitals:  Blood pressure (!) 150/98, pulse (!) 102, temperature 98.3 F (36.8 C), temperature source Oral, resp. rate 18, last menstrual period 04/01/2020, SpO2 98 %. Vitals:   11/02/20 0600 11/02/20 0640 11/02/20 0700 11/02/20 0741  BP: (!) 140/94  (!) 130/96 (!) 150/98  Pulse: 96  (!) 109 (!) 102  Resp:    18  Temp:    98.3 F (36.8 C)  TempSrc:    Oral  SpO2: 97% 98% 97% 98%   Physical Examination: GENERAL: Well-developed, well-nourished female in no acute distress.  LUNGS: Clear to auscultation bilaterally.  HEART: Regular rate and rhythm. ABDOMEN: Soft, nontender, gravid PELVIC: Not indicated EXTREMITIES: No cyanosis, clubbing, or edema, 2+ distal pulses.   Fetal Monitoring:  baseline 120, mod variability, +accels 10x10, no decels Toco: no contractions  Labs:  Results for orders placed or performed during the hospital encounter of 11/01/20 (from the past 24 hour(s))  Protein / creatinine ratio, urine   Collection Time: 11/01/20 12:16 PM  Result Value Ref Range   Creatinine, Urine 102.16 mg/dL   Total Protein, Urine 1,573 mg/dL   Protein Creatinine Ratio 15.40 (H) 0.00 - 0.15 mg/mg[Cre]  Comprehensive metabolic panel   Collection Time: 11/01/20 12:51 PM  Result Value Ref Range    Sodium 134 (L) 135 - 145 mmol/L   Potassium 3.9 3.5 - 5.1 mmol/L   Chloride 104 98 - 111 mmol/L   CO2 22 22 - 32 mmol/L   Glucose, Bld 148 (H) 70 - 99 mg/dL   BUN 8 6 - 20 mg/dL   Creatinine, Ser 9.39 0.44 - 1.00 mg/dL   Calcium 8.2 (L) 8.9 - 10.3 mg/dL   Total Protein 5.4 (L) 6.5 - 8.1 g/dL   Albumin 2.1 (L) 3.5 - 5.0 g/dL   AST 37 15 - 41 U/L   ALT 20 0 - 44 U/L   Alkaline Phosphatase 71 38 - 126 U/L   Total Bilirubin 0.3 0.3 - 1.2 mg/dL   GFR, Estimated >68 >86 mL/min   Anion gap 8 5 - 15  CBC   Collection Time: 11/01/20 12:51 PM  Result Value Ref Range   WBC 10.4 4.0 - 10.5 K/uL   RBC 4.83 3.87 - 5.11 MIL/uL   Hemoglobin 14.8 12.0 - 15.0 g/dL   HCT 48.4 72.0 - 72.1 %   MCV 88.2 80.0 - 100.0 fL   MCH 30.6 26.0 - 34.0 pg   MCHC 34.7 30.0 - 36.0 g/dL   RDW 82.8 83.3 - 74.4 %   Platelets 279 150 - 400 K/uL   nRBC 0.0 0.0 - 0.2 %  Type and screen [facility]   Collection Time: 11/01/20 12:51 PM  Result Value Ref Range   ABO/RH(D) O POS    Antibody Screen NEG  Sample Expiration      11/04/2020,2359 Performed at [facility], [address] [place], [place] [address]   Glucose, capillary   Collection Time: 11/01/20  4:07 PM  Result Value Ref Range   Glucose-Capillary 155 (H) 70 - 99 mg/dL  Culture, beta strep (group b only)   Collection Time: 11/01/20  4:39 PM   Specimen: Vaginal/Rectal; Genital  Result Value Ref Range   Specimen Description VAGINAL/RECTAL    Special Requests NONE    Culture      CULTURE REINCUBATED FOR BETTER GROWTH Performed at [facility], [address] [place], [place] [address]    Report Status PENDING   Resp Panel by RT-PCR (Flu A&B, Covid) Nasopharyngeal Swab   Collection Time: 11/01/20  4:39 PM   Specimen: Nasopharyngeal Swab; Nasopharyngeal(NP) swabs in vial transport medium  Result Value Ref Range   SARS Coronavirus 2 by RT PCR NEGATIVE NEGATIVE   Influenza A by PCR NEGATIVE NEGATIVE   Influenza B by  PCR NEGATIVE NEGATIVE  Glucose, capillary   Collection Time: 11/01/20  9:51 PM  Result Value Ref Range   Glucose-Capillary 338 (H) 70 - 99 mg/dL  Glucose, capillary   Collection Time: 11/02/20  5:06 AM  Result Value Ref Range   Glucose-Capillary 293 (H) 70 - 99 mg/dL  Glucose, capillary   Collection Time: 11/02/20  9:59 AM  Result Value Ref Range   Glucose-Capillary 298 (H) 70 - 99 mg/dL    Imaging Studies:    [place] MFM OB Detail + 14 Weeks  Result Date: 11/02/2020 ----------------------------------------------------------------------  OBSTETRICS REPORT                       (Signed Final 11/02/2020 08:14 am) ---------------------------------------------------------------------- Patient Info  ID #:       [id number]                          D.O.B.:  Nov 07, 1985 (35 yrs)  Name:       Martha [name] [facility]              Visit Date: 11/01/2020 05:18 pm ---------------------------------------------------------------------- Performed By  Attending:        Noralee Space MD        Referred By:      [facility] MAU/Triage  Performed By:     Percell Boston          [facility] ---------------------------------------------------------------------- Orders  #  Description                           Code        Ordered By  1  [place] MFM OB DETAIL +14 WK               76811.01    MATTHEW ECKSTAT  2  [place] MFM UA CORD DOPPLER                50932.67    MATTHEW ECKSTAT ----------------------------------------------------------------------  #  Order #  Accession #                Episode #  1  [id number]                   [id number]                 [id number]  2  [id number]                   [id number]                 [id number] ---------------------------------------------------------------------- Indications  Gestational hypertension without significant   O13.2  proteinuria, second trimester  Pre-existing diabetes, type 2, in  pregnancy,   O24.112  second trimester  [redacted] weeks gestation of pregnancy                Z3A.30  Obesity complicating pregnancy, second         O99.212  trimester  Advanced maternal age multigravida 36+,        O80.522  second trimester  Encounter for antenatal screening,             Z36.9  unspecified  Maternal care for known or suspected poor      O36.5920  fetal growth, second trimester, not applicable  or unspecified IUGR ---------------------------------------------------------------------- Fetal Evaluation  Num Of Fetuses:         1  Fetal Heart Rate(bpm):  135  Cardiac Activity:       Observed  Presentation:           Cephalic  Placenta:               Fundal  P. Cord Insertion:      Not well visualized  Amniotic Fluid  AFI FV:      Within normal limits  AFI Sum(cm)     %Tile       Largest Pocket(cm)  15.8            57          7.1  RUQ(cm)       RLQ(cm)       LUQ(cm)        LLQ(cm)  7.1           4.7           0              4 ---------------------------------------------------------------------- Biometry  BPD:      73.2  mm     G. Age:  29w 3d         10  %    CI:         74.3   %    70 - 86                                                          FL/HC:      20.4   %    19.3 - 21.3  HC:      269.6  mm     G. Age:  29w 3d        2.4  %    HC/AC:      1.05        0.96 - 1.17  AC:  256.3  mm     G. Age:  29w 6d         24  %    FL/BPD:     75.1   %    71 - 87  FL:         55  mm     G. Age:  29w 0d          6  %    FL/AC:      21.5   %    20 - 24  HUM:      45.9  mm     G. Age:  27w 0d        < 5  %  Est. FW:    1400  gm      3 lb 1 oz     10  % ---------------------------------------------------------------------- OB History  Gravidity:    5         Term:   3        Prem:   1        SAB:   0  TOP:          0       Ectopic:  0        Living: 4 ---------------------------------------------------------------------- Gestational Age  LMP:           30w 4d        Date:  04/01/20                 EDD:   01/06/21   U/S Today:     29w 3d                                        EDD:   01/14/21  Best:          30w 4d     Det. By:  LMP  (04/01/20)          EDD:   01/06/21 ---------------------------------------------------------------------- Anatomy  Cranium:               Appears normal         LVOT:                   Not well visualized  Cavum:                 Appears normal         Aortic Arch:            Appears normal  Ventricles:            Appears normal         Ductal Arch:            Not well visualized  Choroid Plexus:        Not well visualized    Diaphragm:              Appears normal  Cerebellum:            Appears normal         Stomach:                Appears normal, left  sided  Posterior Fossa:       Appears normal         Abdomen:                Appears normal  Nuchal Fold:           Not well visualized    Abdominal Wall:         Not well visualized  Face:                  Appears normal         Cord Vessels:           Appears normal (3                         (orbits and profile)                           vessel cord)  Lips:                  Appears normal         Kidneys:                Appear normal  Palate:                Not well visualized    Bladder:                Appears normal  Thoracic:              Appears normal         Spine:                  Appears normal  Heart:                 Not well visualized    Upper Extremities:      Visualized  RVOT:                  Not well visualized    Lower Extremities:      Visualized  Other:  Technically difficult due to maternal habitus and fetal position. ---------------------------------------------------------------------- Doppler - Fetal Vessels  Umbilical Artery   S/D     %tile      RI    %tile      PI    %tile            ADFV    RDFV   2.68       43    0.63       48    0.96       54                N       N ---------------------------------------------------------------------- Cervix Uterus  Adnexa  Cervix  Not visualized (advanced GA >24wks)  Uterus  No abnormality visualized.  Right Ovary  No adnexal mass visualized.  Left Ovary  No adnexal mass visualized.  Cul De Sac  No free fluid seen.  Adnexa  No abnormality visualized. ---------------------------------------------------------------------- Impression  Patient is admitted with severe range blood pressures seen  at office visit. Superimposed preeclampsia. She has poorly-  controlled pregestational diabetes.  On ultrasound, the estimated fetal weight is at the 10th  percentile. Amniotic fluid is normal and good fetal activity is  seen.Fetal anatomical survey appears normal but limited by  advanced gestational age. Umbilical artery Doppler showed  normal forward diastolic flow . ---------------------------------------------------------------------- Recommendations  -Weekly BPP from next week till deiivery. ----------------------------------------------------------------------                  Noralee Space, MD Electronically Signed Final Report   11/02/2020 08:14 am ----------------------------------------------------------------------  [place] MFM UA CORD DOPPLER  Result Date: 11/02/2020 ----------------------------------------------------------------------  OBSTETRICS REPORT                       (Signed Final 11/02/2020 08:14 am) ---------------------------------------------------------------------- Patient Info  ID #:       [id number]                          D.O.B.:  January 03, 1986 (35 yrs)  Name:       VASILIA DISE [facility]              Visit Date: 11/01/2020 05:18 pm ---------------------------------------------------------------------- Performed By  Attending:        Noralee Space MD        Referred By:      [facility] MAU/Triage  Performed By:     Percell Boston          [facility] ---------------------------------------------------------------------- Orders  #   Description                           Code        Ordered By  1  [place] MFM OB DETAIL +14 WK               76811.01    MATTHEW ECKSTAT  2  [place] MFM UA CORD DOPPLER                08657.84    MATTHEW ECKSTAT ----------------------------------------------------------------------  #  Order #                     Accession #                Episode #  1  [id number]                   [id number]                 [id number]  2  [id number]                   [id number]                 [id number] ---------------------------------------------------------------------- Indications  Gestational hypertension without significant   O13.2  proteinuria, second trimester  Pre-existing diabetes, type 2, in pregnancy,   O24.112  second trimester  [redacted] weeks gestation of pregnancy                Z3A.30  Obesity complicating pregnancy, second         O21.212  trimester  Advanced maternal age multigravida 44+,        O40.522  second trimester  Encounter for antenatal screening,  Z36.9  unspecified  Maternal care for known or suspected poor      O36.5920  fetal growth, second trimester, not applicable  or unspecified IUGR ---------------------------------------------------------------------- Fetal Evaluation  Num Of Fetuses:         1  Fetal Heart Rate(bpm):  135  Cardiac Activity:       Observed  Presentation:           Cephalic  Placenta:               Fundal  P. Cord Insertion:      Not well visualized  Amniotic Fluid  AFI FV:      Within normal limits  AFI Sum(cm)     %Tile       Largest Pocket(cm)  15.8            57          7.1  RUQ(cm)       RLQ(cm)       LUQ(cm)        LLQ(cm)  7.1           4.7           0              4 ---------------------------------------------------------------------- Biometry  BPD:      73.2  mm     G. Age:  29w 3d         10  %    CI:         74.3   %    70 - 86                                                          FL/HC:      20.4   %    19.3 - 21.3  HC:      269.6  mm     G. Age:  29w 3d        2.4  %     HC/AC:      1.05        0.96 - 1.17  AC:      256.3  mm     G. Age:  29w 6d         24  %    FL/BPD:     75.1   %    71 - 87  FL:         55  mm     G. Age:  29w 0d          6  %    FL/AC:      21.5   %    20 - 24  HUM:      45.9  mm     G. Age:  27w 0d        < 5  %  Est. FW:    1400  gm      3 lb 1 oz     10  % ---------------------------------------------------------------------- OB History  Gravidity:    5         Term:   3        Prem:   1        SAB:   0  TOP:  0       Ectopic:  0        Living: 4 ---------------------------------------------------------------------- Gestational Age  LMP:           30w 4d        Date:  04/01/20                 EDD:   01/06/21  U/S Today:     29w 3d                                        EDD:   01/14/21  Best:          30w 4d     Det. By:  LMP  (04/01/20)          EDD:   01/06/21 ---------------------------------------------------------------------- Anatomy  Cranium:               Appears normal         LVOT:                   Not well visualized  Cavum:                 Appears normal         Aortic Arch:            Appears normal  Ventricles:            Appears normal         Ductal Arch:            Not well visualized  Choroid Plexus:        Not well visualized    Diaphragm:              Appears normal  Cerebellum:            Appears normal         Stomach:                Appears normal, left                                                                        sided  Posterior Fossa:       Appears normal         Abdomen:                Appears normal  Nuchal Fold:           Not well visualized    Abdominal Wall:         Not well visualized  Face:                  Appears normal         Cord Vessels:           Appears normal (3                         (orbits and profile)                           vessel cord)  Lips:                  Appears normal         Kidneys:                Appear normal  Palate:                Not well visualized    Bladder:                 Appears normal  Thoracic:              Appears normal         Spine:                  Appears normal  Heart:                 Not well visualized    Upper Extremities:      Visualized  RVOT:                  Not well visualized    Lower Extremities:      Visualized  Other:  Technically difficult due to maternal habitus and fetal position. ---------------------------------------------------------------------- Doppler - Fetal Vessels  Umbilical Artery   S/D     %tile      RI    %tile      PI    %tile            ADFV    RDFV   2.68       43    0.63       48    0.96       54                N       N ---------------------------------------------------------------------- Cervix Uterus Adnexa  Cervix  Not visualized (advanced GA >24wks)  Uterus  No abnormality visualized.  Right Ovary  No adnexal mass visualized.  Left Ovary  No adnexal mass visualized.  Cul De Sac  No free fluid seen.  Adnexa  No abnormality visualized. ---------------------------------------------------------------------- Impression  Patient is admitted with severe range blood pressures seen  at office visit. Superimposed preeclampsia. She has poorly-  controlled pregestational diabetes.  On ultrasound, the estimated fetal weight is at the 10th  percentile. Amniotic fluid is normal and good fetal activity is  seen.Fetal anatomical survey appears normal but limited by  advanced gestational age. Umbilical artery Doppler showed  normal forward diastolic flow . ---------------------------------------------------------------------- Recommendations  -Weekly BPP from next week till deiivery. ----------------------------------------------------------------------                  Noralee Space, MD Electronically Signed Final Report   11/02/2020 08:14 am ----------------------------------------------------------------------    Medications:  Scheduled  betamethasone acetate-betamethasone sodium phosphate  12 mg Intramuscular Q24H   docusate sodium  100 mg Oral  Daily   insulin aspart  0-24 Units Subcutaneous TID PC   insulin aspart  15 Units Subcutaneous TID WC   insulin NPH Human  25 Units Subcutaneous BID AC & HS   NIFEdipine  60 mg Oral Daily   prenatal multivitamin  1 tablet Oral Q1200   I have reviewed the patient's current medications.  ASSESSMENT: [id number] [redacted]w[redacted]d Estimated Date of Delivery: 01/06/21  Patient Active Problem List   Diagnosis Date Noted   Supervision of high risk pregnancy, antepartum 11/01/2020   Severe pre-eclampsia 11/01/2020   Chronic hypertension affecting  pregnancy 10/11/2020   Positive for macroalbuminuria 03/22/2018   Diabetes mellitus type 2, uncontrolled, with complications 03/19/2018   Essential hypertension 03/19/2018   Obesity affecting pregnancy, antepartum 03/19/2018   Diabetes mellitus affecting pregnancy, antepartum 08/10/2013   Language barrier 08/10/2013   Obesity 08/10/2013   History of depression 08/10/2013    PLAN: 1) CHTN with severe pre-eclampsia - BP stable - Continue Procardia 60 mg daily - Magnesium sulfate discontinued for CP prophylaxis - Plan for weekly BPP  2) Type 2 DM - NPH increased from 15 units to 20 units BID given recent steroid administration - Continue monitoring CBGs and adjusting  3) IUP at 30 weeks - Continue inpatient monitoring - Second dose of BMZ this afternoon - Awaiting NICU consult - Delivery goal at 34 weeks. Worsening maternal-fetal status will dictate earlier delivery    Talecia Sherlin 11/02/2020,11:18 AM

## 2020-11-03 DIAGNOSIS — O24919 Unspecified diabetes mellitus in pregnancy, unspecified trimester: Secondary | ICD-10-CM

## 2020-11-03 DIAGNOSIS — O1413 Severe pre-eclampsia, third trimester: Secondary | ICD-10-CM

## 2020-11-03 DIAGNOSIS — O10919 Unspecified pre-existing hypertension complicating pregnancy, unspecified trimester: Secondary | ICD-10-CM

## 2020-11-03 DIAGNOSIS — Z789 Other specified health status: Secondary | ICD-10-CM

## 2020-11-03 LAB — COMPREHENSIVE METABOLIC PANEL
ALT: 16 U/L (ref 0–44)
AST: 23 U/L (ref 15–41)
Albumin: 2.1 g/dL — ABNORMAL LOW (ref 3.5–5.0)
Alkaline Phosphatase: 73 U/L (ref 38–126)
Anion gap: 12 (ref 5–15)
BUN: 21 mg/dL — ABNORMAL HIGH (ref 6–20)
CO2: 20 mmol/L — ABNORMAL LOW (ref 22–32)
Calcium: 8.5 mg/dL — ABNORMAL LOW (ref 8.9–10.3)
Chloride: 100 mmol/L (ref 98–111)
Creatinine, Ser: 0.81 mg/dL (ref 0.44–1.00)
GFR, Estimated: >60 mL/min (ref >60)
Glucose, Bld: 345 mg/dL — ABNORMAL HIGH (ref 70–99)
Potassium: 4.3 mmol/L (ref 3.5–5.1)
Sodium: 132 mmol/L — ABNORMAL LOW (ref 135–145)
Total Bilirubin: 0.2 mg/dL — ABNORMAL LOW (ref 0.3–1.2)
Total Protein: 5.4 g/dL — ABNORMAL LOW (ref 6.5–8.1)

## 2020-11-03 LAB — PROTEIN, URINE, 24 HOUR
Collection Interval-UPROT: 24 hours
Protein, 24H Urine: 3640 mg/d — ABNORMAL HIGH (ref 50–100)
Urine Total Volume-UPROT: 2800 mL

## 2020-11-03 LAB — GLUCOSE, CAPILLARY
Glucose-Capillary: 235 mg/dL — ABNORMAL HIGH (ref 70–99)
Glucose-Capillary: 318 mg/dL — ABNORMAL HIGH (ref 70–99)
Glucose-Capillary: 215 mg/dL — ABNORMAL HIGH (ref 70–99)
Glucose-Capillary: 98 mg/dL (ref 70–99)

## 2020-11-03 LAB — CBC
HCT: 39.7 % (ref 36.0–46.0)
Hemoglobin: 13.7 g/dL (ref 12.0–15.0)
MCH: 30.9 pg (ref 26.0–34.0)
MCHC: 34.5 g/dL (ref 30.0–36.0)
MCV: 89.6 fL (ref 80.0–100.0)
Platelets: 307 10*3/uL (ref 150–400)
RBC: 4.43 MIL/uL (ref 3.87–5.11)
RDW: 12.5 % (ref 11.5–15.5)
WBC: 12.4 10*3/uL — ABNORMAL HIGH (ref 4.0–10.5)
nRBC: 0 % (ref 0.0–0.2)

## 2020-11-03 MED ORDER — INSULIN NPH (HUMAN) (ISOPHANE) 100 UNIT/ML ~~LOC~~ SUSP
35.0000 [IU] | Freq: Two times a day (BID) | SUBCUTANEOUS | Status: DC
  Administered 2020-11-03 – 2020-11-05 (×4): 35 [IU] via SUBCUTANEOUS

## 2020-11-03 MED ORDER — METFORMIN HCL 500 MG PO TABS
500.0000 mg | ORAL_TABLET | Freq: Two times a day (BID) | ORAL | Status: DC
  Administered 2020-11-03 – 2020-11-12 (×18): 500 mg via ORAL
  Filled 2020-11-03 (×18): qty 1

## 2020-11-03 MED ORDER — ALUM & MAG HYDROXIDE-SIMETH 200-200-20 MG/5ML PO SUSP
30.0000 mL | Freq: Once | ORAL | Status: AC
  Administered 2020-11-03: 30 mL via ORAL
  Filled 2020-11-03: qty 30

## 2020-11-03 MED ORDER — CYCLOBENZAPRINE HCL 10 MG PO TABS: 10.0000 mg | ORAL_TABLET | Freq: Three times a day (TID) | ORAL | Status: DC | PRN

## 2020-11-03 MED ORDER — INSULIN ASPART 100 UNIT/ML IJ SOLN
20.0000 [IU] | Freq: Three times a day (TID) | INTRAMUSCULAR | Status: DC
  Administered 2020-11-03 – 2020-11-05 (×7): 20 [IU] via SUBCUTANEOUS

## 2020-11-03 MED ORDER — LIDOCAINE VISCOUS HCL 2 % MT SOLN
15.0000 mL | Freq: Once | OROMUCOSAL | Status: AC
  Administered 2020-11-03: 15 mL via ORAL
  Filled 2020-11-03: qty 15

## 2020-11-03 NOTE — Progress Notes (Signed)
RN assessed pt. Patient complains of HA 9/10, blurred vision, myodesopsia, and intermittent right epigastric pain 5/10. BP 144/90, +1 reflexes, no clonus, no edema noted. Will contact provider with updates; will continue to monitor.

## 2020-11-03 NOTE — Progress Notes (Signed)
Patient ID: Martha [name], female   DOB: 11/20/1985, 35 y.o.   MRN: [id number] FACULTY PRACTICE ANTEPARTUM(COMPREHENSIVE) NOTE  AUSET FRITZLER [name] is a 35 y.o. [id number] at [redacted]w[redacted]d by best clinical estimate who is admitted for [facility] with SIPE with severe features.   Fetal presentation is cephalic. Length of Stay:  2  Days  ASSESSMENT: Principal Problem:   Severe pre-eclampsia Active Problems:   Diabetes mellitus affecting pregnancy, antepartum   Language barrier   Obesity affecting pregnancy, antepartum   Chronic hypertension affecting pregnancy  PLAN: CHTN with SIPE - stable Reports H/A, scotomata, and nausea after getting a medicine in her IV. Once this is done, she does not have symptoms at rest.  Repeat labs BP is controlled on Procardia Added GI cocktail for nausea  Added Flexeril for H/A  T2DM - worsening Her Blood glucose control is worsening--increased her NPH to 35 u bid and novolog to 20u tid WC. Add Metformin.  Language Barrier Spanish interpreter: Raquel used  H/o SVD x 4  Subjective: Feels ok, when not getting her meds.  Patient reports the fetal movement as active. Patient reports uterine contraction  activity as none. Patient reports  vaginal bleeding as none. Patient describes fluid per vagina as None.  Vitals:  Blood pressure (!) 144/90, pulse 92, temperature 98.3 F (36.8 C), temperature source Oral, resp. rate 16, last menstrual period 04/01/2020, SpO2 98 %. Physical Examination:  General appearance - alert, well appearing, and in no distress Chest - normal effort Abdomen - gravid, non-tender Fundal Height:  size equals dates Extremities: Homans sign is negative, no sign of DVT  Membranes:intact  Fetal Monitoring:  Baseline: 140 bpm, Variability: Good {> 6 bpm), Accelerations: Reactive, and Decelerations: Absent  Labs:  Results for orders placed or performed during the hospital encounter of 11/01/20 (from the past 24 hour(s))  Glucose, capillary    Collection Time: 11/02/20  3:23 PM  Result Value Ref Range   Glucose-Capillary 228 (H) 70 - 99 mg/dL  Glucose, capillary   Collection Time: 11/02/20  8:31 PM  Result Value Ref Range   Glucose-Capillary 329 (H) 70 - 99 mg/dL  Glucose, capillary   Collection Time: 11/03/20  7:55 AM  Result Value Ref Range   Glucose-Capillary 235 (H) 70 - 99 mg/dL  Glucose, capillary   Collection Time: 11/03/20  9:58 AM  Result Value Ref Range   Glucose-Capillary 318 (H) 70 - 99 mg/dL  CBC   Collection Time: 11/03/20 10:11 AM  Result Value Ref Range   WBC 12.4 (H) 4.0 - 10.5 K/uL   RBC 4.43 3.87 - 5.11 MIL/uL   Hemoglobin 13.7 12.0 - 15.0 g/dL   HCT 73.7 10.6 - 26.9 %   MCV 89.6 80.0 - 100.0 fL   MCH 30.9 26.0 - 34.0 pg   MCHC 34.5 30.0 - 36.0 g/dL   RDW 48.5 46.2 - 70.3 %   Platelets 307 150 - 400 K/uL   nRBC 0.0 0.0 - 0.2 %    Imaging Studies:      Medications:  Scheduled  alum & mag hydroxide-simeth  30 mL Oral Once   And   lidocaine  15 mL Oral Once   docusate sodium  100 mg Oral Daily   insulin aspart  0-24 Units Subcutaneous TID PC   insulin aspart  20 Units Subcutaneous TID WC   insulin NPH Human  35 Units Subcutaneous BID AC & HS   metFORMIN  500 mg Oral BID WC  NIFEdipine  60 mg Oral Daily   prenatal multivitamin  1 tablet Oral Q1200   I have reviewed the patient's current medications.   Reva Bores, MD 11/03/2020,11:11 AM

## 2020-11-04 DIAGNOSIS — O99210 Obesity complicating pregnancy, unspecified trimester: Secondary | ICD-10-CM

## 2020-11-04 LAB — GLUCOSE, CAPILLARY
Glucose-Capillary: 89 mg/dL (ref 70–99)
Glucose-Capillary: 116 mg/dL — ABNORMAL HIGH (ref 70–99)
Glucose-Capillary: 128 mg/dL — ABNORMAL HIGH (ref 70–99)
Glucose-Capillary: 121 mg/dL — ABNORMAL HIGH (ref 70–99)

## 2020-11-04 LAB — CULTURE, BETA STREP (GROUP B ONLY)

## 2020-11-04 MED ORDER — LABETALOL HCL 200 MG PO TABS
200.0000 mg | ORAL_TABLET | Freq: Three times a day (TID) | ORAL | Status: DC
  Administered 2020-11-04 – 2020-11-05 (×5): 200 mg via ORAL
  Filled 2020-11-04 (×5): qty 1

## 2020-11-04 MED ORDER — PANTOPRAZOLE SODIUM 40 MG PO TBEC
40.0000 mg | DELAYED_RELEASE_TABLET | Freq: Every day | ORAL | Status: DC
  Administered 2020-11-04 – 2020-11-17 (×14): 40 mg via ORAL
  Filled 2020-11-04 (×14): qty 1

## 2020-11-04 MED ORDER — LABETALOL HCL 200 MG PO TABS: 200.0000 mg | ORAL_TABLET | Freq: Three times a day (TID) | ORAL | Status: DC

## 2020-11-04 NOTE — Progress Notes (Signed)
Patient ID: Martha [name], female   DOB: 11-21-1985, 35 y.o.   MRN: [id number] FACULTY PRACTICE ANTEPARTUM(COMPREHENSIVE) NOTE  Martha [name] is a 35 y.o. [id number] at [redacted]w[redacted]d by best clinical estimate who is admitted for [facility] with SIPE with severe features based on blood pressure.   Fetal presentation is cephalic. Length of Stay:  3  Days  ASSESSMENT: Principal Problem:   Severe pre-eclampsia Active Problems:   Diabetes mellitus affecting pregnancy, antepartum   Language barrier   Obesity affecting pregnancy, antepartum   Chronic hypertension affecting pregnancy   PLAN: CHTN with SIPE - worsening Repeat labs BP control is worsening--required Labetalol IV and IV hydralazine today Reports IV labetalol give her symptoms of abdominal pain/pelvic pain and Nausea. Continue Procardia PO labetalol added this am at 200 mg tid Flexeril for H/A Goal is for 34 weeks if can keep symptoms under control and control BP. Weekly BPP   T2DM - improving Increased her NPH to 35 u bid and novolog to 20u tid WC. Added Metformin on 11/03/20. CBGs are much improved compared to 200-300 on yesterday.  May need to back down on dosing in next 1-2 days as BMZ effect improves.   Language Barrier Spanish interpreter: Raquel used  Nausea and GERD - new Add PPI  Has Zofran prn nausea   H/o SVD x 4  Subjective: Reports heartburn and pelvic pain when receives IV labetalol. Patient reports the fetal movement as active. Patient reports uterine contraction  activity as none. Patient reports  vaginal bleeding as none. Patient describes fluid per vagina as None.  Vitals:  Blood pressure (!) 156/79, pulse 75, temperature 97.9 F (36.6 C), temperature source Oral, resp. rate 18, last menstrual period 04/01/2020, SpO2 97 %. Physical Examination:  General appearance - alert, well appearing, and in no distress Chest - normal effort Abdomen - gravid, non-tender Fundal Height:  size equals  dates Extremities: Homans sign is negative, no sign of DVT  Membranes:intact  Fetal Monitoring:  Baseline: 135 bpm, Variability: Good {> 6 bpm), Accelerations: Reactive, and Decelerations: Absent  Labs:  Results for orders placed or performed during the hospital encounter of 11/01/20 (from the past 24 hour(s))  Glucose, capillary   Collection Time: 11/03/20  2:07 PM  Result Value Ref Range   Glucose-Capillary 215 (H) 70 - 99 mg/dL  Glucose, capillary   Collection Time: 11/03/20  8:51 PM  Result Value Ref Range   Glucose-Capillary 98 70 - 99 mg/dL  Glucose, capillary   Collection Time: 11/04/20  6:52 AM  Result Value Ref Range   Glucose-Capillary 89 70 - 99 mg/dL  Glucose, capillary   Collection Time: 11/04/20  8:53 AM  Result Value Ref Range   Glucose-Capillary 116 (H) 70 - 99 mg/dL    Imaging Studies:      Medications:  Scheduled  docusate sodium  100 mg Oral Daily   insulin aspart  0-24 Units Subcutaneous TID PC   insulin aspart  20 Units Subcutaneous TID WC   insulin NPH Human  35 Units Subcutaneous BID AC & HS   labetalol  200 mg Oral Q8H   metFORMIN  500 mg Oral BID WC   NIFEdipine  60 mg Oral Daily   prenatal multivitamin  1 tablet Oral Q1200   I have reviewed the patient's current medications.   Reva Bores, MD 11/04/2020,10:41 AM

## 2020-11-05 LAB — GLUCOSE, CAPILLARY
Glucose-Capillary: 146 mg/dL — ABNORMAL HIGH (ref 70–99)
Glucose-Capillary: 128 mg/dL — ABNORMAL HIGH (ref 70–99)
Glucose-Capillary: 73 mg/dL (ref 70–99)
Glucose-Capillary: 128 mg/dL — ABNORMAL HIGH (ref 70–99)
Glucose-Capillary: 137 mg/dL — ABNORMAL HIGH (ref 70–99)
Glucose-Capillary: 69 mg/dL — ABNORMAL LOW (ref 70–99)
Glucose-Capillary: 119 mg/dL — ABNORMAL HIGH (ref 70–99)
Glucose-Capillary: 103 mg/dL — ABNORMAL HIGH (ref 70–99)

## 2020-11-05 LAB — TYPE AND SCREEN
ABO/RH(D): O POS
Antibody Screen: NEGATIVE

## 2020-11-05 MED ORDER — INSULIN NPH (HUMAN) (ISOPHANE) 100 UNIT/ML ~~LOC~~ SUSP
38.0000 [IU] | Freq: Two times a day (BID) | SUBCUTANEOUS | Status: DC
  Administered 2020-11-05 – 2020-11-09 (×8): 38 [IU] via SUBCUTANEOUS

## 2020-11-05 MED ORDER — LABETALOL HCL 200 MG PO TABS
400.0000 mg | ORAL_TABLET | Freq: Three times a day (TID) | ORAL | Status: DC
  Administered 2020-11-05 – 2020-11-06 (×2): 400 mg via ORAL
  Filled 2020-11-05 (×2): qty 2

## 2020-11-05 MED ORDER — INSULIN ASPART 100 UNIT/ML IJ SOLN
24.0000 [IU] | Freq: Three times a day (TID) | INTRAMUSCULAR | Status: DC
  Administered 2020-11-06 – 2020-11-07 (×3): 24 [IU] via SUBCUTANEOUS

## 2020-11-05 NOTE — Progress Notes (Signed)
   11/05/20 2016  Vital Signs  BP (!) 164/99  BP Location Left Arm  Patient Position (if appropriate) Semi-fowlers  Pulse Rate 86  Pulse Rate Source Monitor  Resp 20   Labetalol 80mg  IV given at 2020

## 2020-11-05 NOTE — Progress Notes (Signed)
Inpatient Diabetes Program Recommendations  AACE/ADA: New Consensus Statement on Inpatient Glycemic Control (2015)  Target Ranges:  Prepandial:   less than 140 mg/dL      Peak postprandial:   less than 180 mg/dL (1-2 hours)      Critically ill patients:  140 - 180 mg/dL   Lab Results  Component Value Date   GLUCAP 128 (H) 11/05/2020   HGBA1C 13.1 (A) 03/19/2018    Review of Glycemic Control Results for Martha [name], Martha [name] (MRN [id number]) as of 11/05/2020 09:07  Ref. Range 11/04/2020 14:14 11/04/2020 20:04 11/05/2020 05:10 11/05/2020 07:34  Glucose-Capillary Latest Ref Range: 70 - 99 mg/dL 607 (H) 371 (H) 062 (H) 128 (H)   Diabetes history: Type 2 DM Outpatient Diabetes medications: NPH 20 units QM, 10 units QHS Current orders for Inpatient glycemic control: NPH 35 units BID, Novolog 0-24 units TID, Novolog 20 units TID, Metformin 500 mg BID BMZ x 2   Inpatient Diabetes Program Recommendations:     Consider further increasing NPH to 38 units BID, Novolog 24 units TID (assuming patient is consuming >50% of meals).  Thanks, Lujean Rave, MSN, RNC-OB Diabetes Coordinator [phone] (8a-5p)

## 2020-11-05 NOTE — Progress Notes (Addendum)
Dr Despina Hidden notified of B/Ps and meds given.  No new order.

## 2020-11-05 NOTE — Plan of Care (Signed)
Blood pressures continue to trend on the higher side. Medications were changed earlier in the morning.CBG's were better today since sliding scale and NPH doses were increased.

## 2020-11-05 NOTE — Progress Notes (Signed)
   11/05/20 2316  Vital Signs  BP (!) 144/83  BP Location Left Arm  Patient Position (if appropriate) Supine  BP Method Automatic  Pulse Rate 87  Pulse Rate Source Monitor  Resp 18  Oxygen Therapy  SpO2 99 %   15 minutes after Hydralazine 10mg  IV

## 2020-11-05 NOTE — Progress Notes (Signed)
   11/05/20 2300  Vital Signs  BP (!) 172/97  BP Location Left Arm  Patient Position (if appropriate) Supine  BP Method Automatic  Pulse Rate 75  Pulse Rate Source Monitor  Resp 18  Oxygen Therapy  SpO2 98 %   Hydralazine 10mg  IV given

## 2020-11-05 NOTE — Progress Notes (Signed)
   11/05/20 1933  Vital Signs  BP (!) 167/109  BP Location Left Arm  Patient Position (if appropriate) Sitting  BP Method Automatic  Pulse Rate 91  Pulse Rate Source Monitor  Resp 20  Temp 99 F (37.2 C)  Temp Source Oral  Oxygen Therapy  SpO2 100 %

## 2020-11-05 NOTE — Progress Notes (Signed)
   11/05/20 1950  Vital Signs  BP (!) 177/97  BP Location Left Arm  Patient Position (if appropriate) Semi-fowlers  BP Method Automatic  Pulse Rate Source Monitor  Resp 20   Labetalol 40mg  IV given at 

## 2020-11-05 NOTE — Progress Notes (Signed)
  Nutrition: consult for GDM diet education  Nutrition Dx: Food and nutrition-related knowledge deficit r/t limited comprehension of previous education aeb pt report.  Interpreter present  Nutrition education consult for Carbohydrate Modified Gestational Diabetic Diet completed. AND "Meal  plan for gestational diabetics" handout in spanish given to patient.   Pt having difficulty ordering meals and exceeding CHO count  Basic concepts reviewed:  What foods contain high carbohydrate content No juice - order sugar free beverages Order protein portions first, then add in sides. Follow numbers beside foods that indicate CHO count.  Add up numbers on your phone Do not exceed 50 g at any meal Vegetables and salads have lower CHO content  Try not to skip a meal. Eat 3 meals and 3 snacks  Pt planned out a meal as practice  Questions answered.  Patient verbalizes understanding.

## 2020-11-05 NOTE — Progress Notes (Signed)
Patient ID: Martha [name], female   DOB: 1985-09-14, 35 y.o.   MRN: [id number] FACULTY PRACTICE ANTEPARTUM(COMPREHENSIVE) NOTE  Martha [name] [name] is a 35 y.o. [id number] at [redacted]w[redacted]d by best clinical estimate who is admitted for [facility] with SIPE with severe features based on blood pressure.   Fetal presentation is cephalic. Length of Stay:  4  Days  ASSESSMENT: Principal Problem:   Severe pre-eclampsia Active Problems:   Diabetes mellitus affecting pregnancy, antepartum   Language barrier   Obesity affecting pregnancy, antepartum   Chronic hypertension affecting pregnancy   PLAN: CHTN with SIPE - stable Repeat labs 10/17 wnl BP control <160/110 with Procardia 60 daily and labetolol 200 Q8. Labetalol was added 10/16. Flexeril for H/A - this is resolved today.  Goal is for 34 weeks if can keep symptoms under control and control BP. Weekly BPP, NST BID   T2DM - improving Increased her NPH to 35u bid and novolog to 20u tid WC yesterday. Added Metformin on 11/03/20. Reviewed Diabetes Coordinator note - will increased to 38bid and 24 tid WC. Will likely need to taper back down once steroid effect continues to decrease. CBGs are much improved compared to 200-300 on 10/15.    Language Barrier Spanish interpreter: Heidi used   Nausea and GERD  Add PPI  Has Zofran prn nausea   H/o SVD x 4  Subjective: Reports feeling her body getting hot but then it goes away.  Patient reports the fetal movement as active. Patient reports uterine contraction  activity as none. Patient reports  vaginal bleeding as none. Patient describes fluid per vagina as None.  Vitals:  Blood pressure (!) 157/95, pulse 89, temperature 98 F (36.7 C), temperature source Oral, resp. rate 18, last menstrual period 04/01/2020, SpO2 99 %. Physical Examination:  General appearance - alert, well appearing, and in no distress Chest - normal effort Abdomen - gravid, non-tender Fundal Height:  size equals  dates Extremities: Homans sign is negative, no sign of DVT  Membranes:intact  Fetal Monitoring:  Baseline: 130 bpm, Variability: Good {> 6 bpm), Accelerations: Reactive with 10x10 appropriate for GA, and Decelerations: Absent  Labs:  Results for orders placed or performed during the hospital encounter of 11/01/20 (from the past 24 hour(s))  Glucose, capillary   Collection Time: 11/04/20  2:14 PM  Result Value Ref Range   Glucose-Capillary 128 (H) 70 - 99 mg/dL  Glucose, capillary   Collection Time: 11/04/20  8:04 PM  Result Value Ref Range   Glucose-Capillary 121 (H) 70 - 99 mg/dL  Glucose, capillary   Collection Time: 11/05/20  5:10 AM  Result Value Ref Range   Glucose-Capillary 146 (H) 70 - 99 mg/dL  Glucose, capillary   Collection Time: 11/05/20  7:34 AM  Result Value Ref Range   Glucose-Capillary 128 (H) 70 - 99 mg/dL  Type and screen [facility]   Collection Time: 11/05/20 10:02 AM  Result Value Ref Range   ABO/RH(D) O POS    Antibody Screen NEG    Sample Expiration      11/08/2020,2359 Performed at [facility], [address] [place], [place] [address]   Glucose, capillary   Collection Time: 11/05/20 10:13 AM  Result Value Ref Range   Glucose-Capillary 73 70 - 99 mg/dL  Glucose, capillary   Collection Time: 11/05/20 11:50 AM  Result Value Ref Range   Glucose-Capillary 128 (H) 70 - 99 mg/dL    Imaging Studies:  Medications:  Scheduled  docusate sodium  100 mg Oral Daily   insulin aspart  0-24 Units Subcutaneous TID PC   insulin aspart  20 Units Subcutaneous TID WC   insulin NPH Human  35 Units Subcutaneous BID AC & HS   labetalol  200 mg Oral Q8H   metFORMIN  500 mg Oral BID WC   NIFEdipine  60 mg Oral Daily   pantoprazole  40 mg Oral Daily   prenatal multivitamin  1 tablet Oral Q1200   I have reviewed the patient's current medications.   Milas Hock, MD 11/05/2020,1:35 PM

## 2020-11-05 NOTE — Progress Notes (Signed)
   11/05/20 2247  Vital Signs  BP (!) 169/94  BP Location Left Arm  Patient Position (if appropriate) Supine  BP Method Automatic  Pulse Rate 78  Pulse Rate Source Monitor  Resp 18  Temp 97.6 F (36.4 C)  Temp Source Oral  Oxygen Therapy  SpO2 98 %

## 2020-11-05 NOTE — Progress Notes (Signed)
   11/05/20 2048  Vital Signs  BP (!) 155/95  BP Location Left Arm  Patient Position (if appropriate) Supine  BP Method Automatic  Pulse Rate 80  Pulse Rate Source Monitor  Resp 20

## 2020-11-05 NOTE — Progress Notes (Signed)
   11/05/20 2036  Vital Signs  BP (!) 163/94  BP Location Left Arm  Patient Position (if appropriate) Supine  BP Method Automatic  Pulse Rate 88   After Labetalol 80mg  IV

## 2020-11-06 ENCOUNTER — Other Ambulatory Visit: Payer: Self-pay

## 2020-11-06 ENCOUNTER — Ambulatory Visit: Payer: Self-pay

## 2020-11-06 ENCOUNTER — Encounter (HOSPITAL_COMMUNITY): Payer: Self-pay | Admitting: Family Medicine

## 2020-11-06 LAB — GLUCOSE, CAPILLARY
Glucose-Capillary: 167 mg/dL — ABNORMAL HIGH (ref 70–99)
Glucose-Capillary: 153 mg/dL — ABNORMAL HIGH (ref 70–99)
Glucose-Capillary: 97 mg/dL (ref 70–99)
Glucose-Capillary: 121 mg/dL — ABNORMAL HIGH (ref 70–99)
Glucose-Capillary: 165 mg/dL — ABNORMAL HIGH (ref 70–99)
Glucose-Capillary: 67 mg/dL — ABNORMAL LOW (ref 70–99)
Glucose-Capillary: 88 mg/dL (ref 70–99)
Glucose-Capillary: 149 mg/dL — ABNORMAL HIGH (ref 70–99)

## 2020-11-06 MED ORDER — INSULIN ASPART 100 UNIT/ML IJ SOLN
0.0000 [IU] | Freq: Three times a day (TID) | INTRAMUSCULAR | Status: DC
  Administered 2020-11-06: 3 [IU] via SUBCUTANEOUS
  Administered 2020-11-08 – 2020-11-09 (×4): 1 [IU] via SUBCUTANEOUS
  Administered 2020-11-10: 2 [IU] via SUBCUTANEOUS
  Administered 2020-11-10: 1 [IU] via SUBCUTANEOUS
  Administered 2020-11-10: 2 [IU] via SUBCUTANEOUS
  Administered 2020-11-10 – 2020-11-11 (×4): 1 [IU] via SUBCUTANEOUS
  Administered 2020-11-12: 3 [IU] via SUBCUTANEOUS
  Administered 2020-11-12: 2 [IU] via SUBCUTANEOUS
  Administered 2020-11-13: 3 [IU] via SUBCUTANEOUS
  Administered 2020-11-13: 2 [IU] via SUBCUTANEOUS
  Administered 2020-11-13: 1 [IU] via SUBCUTANEOUS
  Administered 2020-11-14: 4 [IU] via SUBCUTANEOUS
  Administered 2020-11-14: 2 [IU] via SUBCUTANEOUS
  Administered 2020-11-14: 1 [IU] via SUBCUTANEOUS
  Administered 2020-11-15: 3 [IU] via SUBCUTANEOUS
  Administered 2020-11-15: 2 [IU] via SUBCUTANEOUS
  Administered 2020-11-15: 4 [IU] via SUBCUTANEOUS
  Administered 2020-11-16: 2 [IU] via SUBCUTANEOUS
  Administered 2020-11-16: 6 [IU] via SUBCUTANEOUS
  Administered 2020-11-16 – 2020-11-17 (×2): 1 [IU] via SUBCUTANEOUS
  Administered 2020-11-17: 3 [IU] via SUBCUTANEOUS

## 2020-11-06 MED ORDER — NIFEDIPINE ER OSMOTIC RELEASE 60 MG PO TB24
60.0000 mg | ORAL_TABLET | Freq: Two times a day (BID) | ORAL | Status: DC
  Administered 2020-11-06 – 2020-11-20 (×27): 60 mg via ORAL
  Filled 2020-11-06 (×27): qty 1

## 2020-11-06 MED ORDER — LABETALOL HCL 200 MG PO TABS
600.0000 mg | ORAL_TABLET | Freq: Three times a day (TID) | ORAL | Status: DC
  Administered 2020-11-06 – 2020-11-10 (×13): 600 mg via ORAL
  Filled 2020-11-06 (×13): qty 3

## 2020-11-06 NOTE — Progress Notes (Signed)
   11/06/20 0527  Vital Signs  BP (!) 150/96  BP Location Left Arm  Patient Position (if appropriate) Left side lying/tilt  BP Method Automatic  Pulse Rate 88  Pulse Rate Source Monitor  Resp 18

## 2020-11-06 NOTE — Consult Note (Signed)
Consultation Service: Neonatology   Dr. Dione Plover has asked for consultation on [name] regarding the care of a premature infant at 4 weeks. Thank you for inviting [place] to see this patient.   Reason for consult:  Explain the possible complications, the prognosis, and the care of a premature infant at 31 weeks.   Chief complaint: 35yo female with a 31 week IUP with an estimated weight of 1400 grams.   My key findings of this patient's HPI are:  I have reviewed the patient's chart and have met with her. [name] was admitted for significant for chronic hypertension & T2DM with superimposed preeclampsia. Blood pressure remain elevated despite use of 2 agents. Mother has been given magnesium.   Prenatal labs:GBS unknown   Prenatal care:   good Pregnancy complications:  chronic HTN, pre-eclampsia, T2DM Maternal antibiotics: This patient's mother is not on file. Maternal Steroids: yes  Most recent dose:  10/13-10/14     My recommendations for this patient and my actions included:   1 I spent 40 minutes discussing the possible complications and outcomes of prematurity at this gestational age. I discussed the likely need for respiratory support with CPAP or mechanical ventilation and surfactant administration for respiratory distress, IV fluids pending establishment of enteral feeds (encouraged breast milk feeding to which she planned to do), antibiotics for possible sepsis, temperature support, and monitoring. I also discussed the potential risk of complications such as intracranial hemorrhage, retinopathy, hearing deficit, and chronic lung disease. I also discussed the potential length of stay in the neonatal intensive care unit for about 9 weeks.   2. I also discussed the neurological complications and possibility of developmental delay. She expressed an understanding of this information.   3. I informed her that the NICU team would be present at the delivery.   4. A visit to the  NICU by the infant's mother and/or a significant other was encouraged. Visitation policy was discussed.   Final Impression:  35 yo  female with a 31 week IUP who potentially requires early delivery and who now understands the possible complications and prognosis of her infant. All information was conveyed to mother through a Spanish interpretor.    Davonna Belling, MD Attending Neonatologist

## 2020-11-06 NOTE — Plan of Care (Signed)
  Problem: Education: Goal: Knowledge of the prescribed therapeutic regimen will improve Outcome: Progressing   Problem: Education: Goal: Knowledge of disease or condition will improve Outcome: Progressing Goal: Knowledge of the prescribed therapeutic regimen will improve Outcome: Progressing   Problem: Education: Goal: Knowledge of disease or condition will improve Outcome: Completed/Met

## 2020-11-06 NOTE — Progress Notes (Signed)
Inpatient Diabetes Program Recommendations  Diabetes Treatment Program Recommendations  ADA Standards of Care 2018 Diabetes in Pregnancy Target Glucose Ranges:  Fasting: 60 - 90 mg/dL Preprandial: 60 - 762 mg/dL 1 hr postprandial: Less than 140mg /dL (from first bite of meal) 2 hr postprandial: Less than 120 mg/dL (from first bite of meal)      Lab Results  Component Value Date   GLUCAP 167 (H) 11/06/2020   HGBA1C 13.1 (A) 03/19/2018    Review of Glycemic Control Results for Martha [name], Martha [name] (MRN [name]) as of 11/06/2020 08:53  Ref. Range 11/05/2020 18:49 11/05/2020 20:56 11/05/2020 22:49 11/06/2020 08:18  Glucose-Capillary Latest Ref Range: 70 - 99 mg/dL 69 (L) 11/08/2020 (H) 256 (H) 167 (H)     Diabetes history: Type 2 DM Outpatient Diabetes medications: NPH 20 units QM, 10 units QHS Current orders for Inpatient glycemic control: NPH 38 units BID, Novolog 0-24 units TID, Novolog 24 units TID, Metformin 500 mg BID BMZ x 2   Inpatient Diabetes Program Recommendations:     Consider decreasing correction to Novolog 0-14 units TID.  Thanks, [phone], MSN, RNC-OB Diabetes Coordinator [phone] (8a-5p)

## 2020-11-06 NOTE — Progress Notes (Signed)
At shift assessment patient nauseous with headache and blurry vision. Patient claims poor appetite and had not eaten dinner. Glucose taken for symptoms, CBG=67. Per hypoglycemic protocol, patient given 4oz juice. 15 minutes later CBG=88. 24 unit Novolog and sliding scale held for low glucose and no meal eaten. Approximately 2100, patient still not eaten due to poor appetite, RN encouraged crackers to help with nausea, still no full meal eaten. Prior to giving 38 units NPH for bedtime, glucose checked, CBG=146. NPH given. Shift vitals showed severe BP, still severe 15 minutes later.  RN completed 20 and 40 dose of labetalol protocol.  Raelyn Ensign, RN

## 2020-11-06 NOTE — Progress Notes (Signed)
Patient ID: Martha [name], female   DOB: 1985/11/28, 35 y.o.   MRN: [id number] FACULTY PRACTICE ANTEPARTUM(COMPREHENSIVE) NOTE  Martha [name] is a 35 y.o. [id number] at [redacted]w[redacted]d by best clinical estimate who is admitted for [facility] with SIPE with severe features based on blood pressure.   Fetal presentation is cephalic. Length of Stay:  5  Days  ASSESSMENT: Principal Problem:   Severe pre-eclampsia Active Problems:   Diabetes mellitus affecting pregnancy, antepartum   Language barrier   Obesity affecting pregnancy, antepartum   Chronic hypertension affecting pregnancy   PLAN: CHTN with SIPE - stable Repeat labs 10/17 wnl BP control <160/110 with Procardia 60 BID and labetolol 400 Q8 and she has required some IV labetalol. Will continue to titrate until we have maxed out 2 agents. Would not go up on the Procardia but can still up on Labetalol  Goal is for 34 weeks if can keep symptoms under control and control BP. Weekly BPP, NST BID   T2DM - improving NPH to 35u bid and novolog to 20u tid WC. Added Metformin on 11/03/20. Reviewed Diabetes Coordinator note - will adjust per her recommendations. Will likely need to taper back down once steroid effect continues to decrease. CBGs are much improved compared to 200-300 on 10/15.    Language Barrier Spanish interpreter: Alecia Lemming used [phone]  Nausea and GERD  Add PPI  Has Zofran prn nausea   H/o SVD x 4  Subjective: Reports feeling shaking in her body today, but denies HA/BV/RUQ pain.  Patient reports the fetal movement as active. Patient reports uterine contraction  activity as none. Patient reports  vaginal bleeding as none. Patient describes fluid per vagina as None.  Vitals:  Blood pressure 138/69, pulse 88, temperature 97.9 F (36.6 C), temperature source Oral, resp. rate 18, height 5\' 1"  (1.549 m), weight 90.7 kg, last menstrual period 04/01/2020, SpO2 99 %. Physical Examination:  General appearance - alert, well appearing,  and in no distress Chest - normal effort Abdomen - gravid, non-tender Fundal Height:  size equals dates Extremities: Homans sign is negative, no sign of DVT  Membranes:intact  Fetal Monitoring:  Baseline: 130 bpm, Variability: Good {> 6 bpm), Accelerations: Reactive with 10x10 appropriate for GA, and Decelerations: Absent  Labs:  Results for orders placed or performed during the hospital encounter of 11/01/20 (from the past 24 hour(s))  Glucose, capillary   Collection Time: 11/05/20 11:50 AM  Result Value Ref Range   Glucose-Capillary 128 (H) 70 - 99 mg/dL  Glucose, capillary   Collection Time: 11/05/20  1:58 PM  Result Value Ref Range   Glucose-Capillary 137 (H) 70 - 99 mg/dL  Glucose, capillary   Collection Time: 11/05/20  6:49 PM  Result Value Ref Range   Glucose-Capillary 69 (L) 70 - 99 mg/dL  Glucose, capillary   Collection Time: 11/05/20  8:56 PM  Result Value Ref Range   Glucose-Capillary 119 (H) 70 - 99 mg/dL  Glucose, capillary   Collection Time: 11/05/20 10:49 PM  Result Value Ref Range   Glucose-Capillary 103 (H) 70 - 99 mg/dL  Glucose, capillary   Collection Time: 11/06/20  8:18 AM  Result Value Ref Range   Glucose-Capillary 167 (H) 70 - 99 mg/dL  Glucose, capillary   Collection Time: 11/06/20 10:11 AM  Result Value Ref Range   Glucose-Capillary 153 (H) 70 - 99 mg/dL    Imaging Studies:      Medications:  Scheduled  docusate sodium  100 mg Oral Daily  insulin aspart  0-14 Units Subcutaneous TID PC   insulin aspart  24 Units Subcutaneous TID WC   insulin NPH Human  38 Units Subcutaneous BID AC & HS   labetalol  400 mg Oral Q8H   metFORMIN  500 mg Oral BID WC   NIFEdipine  60 mg Oral BID   pantoprazole  40 mg Oral Daily   prenatal multivitamin  1 tablet Oral Q1200   I have reviewed the patient's current medications.   Milas Hock, MD 11/06/2020,11:36 AM

## 2020-11-07 DIAGNOSIS — Z3A30 30 weeks gestation of pregnancy: Secondary | ICD-10-CM

## 2020-11-07 DIAGNOSIS — O119 Pre-existing hypertension with pre-eclampsia, unspecified trimester: Secondary | ICD-10-CM

## 2020-11-07 DIAGNOSIS — O1413 Severe pre-eclampsia, third trimester: Secondary | ICD-10-CM | POA: Diagnosis not present

## 2020-11-07 LAB — GLUCOSE, CAPILLARY
Glucose-Capillary: 186 mg/dL — ABNORMAL HIGH (ref 70–99)
Glucose-Capillary: 64 mg/dL — ABNORMAL LOW (ref 70–99)
Glucose-Capillary: 84 mg/dL (ref 70–99)
Glucose-Capillary: 74 mg/dL (ref 70–99)
Glucose-Capillary: 66 mg/dL — ABNORMAL LOW (ref 70–99)
Glucose-Capillary: 87 mg/dL (ref 70–99)
Glucose-Capillary: 87 mg/dL (ref 70–99)
Glucose-Capillary: 101 mg/dL — ABNORMAL HIGH (ref 70–99)

## 2020-11-07 MED ORDER — INSULIN ASPART 100 UNIT/ML IJ SOLN
22.0000 [IU] | Freq: Three times a day (TID) | INTRAMUSCULAR | Status: DC
  Administered 2020-11-07 – 2020-11-08 (×4): 22 [IU] via SUBCUTANEOUS

## 2020-11-07 NOTE — Progress Notes (Addendum)
Inpatient Diabetes Program Recommendations  Diabetes Treatment Program Recommendations  ADA Standards of Care 2018 Diabetes in Pregnancy Target Glucose Ranges:  Fasting: 60 - 90 mg/dL Preprandial: 60 - 161 mg/dL 1 hr postprandial: Less than 140mg /dL (from first bite of meal) 2 hr postprandial: Less than 120 mg/dL (from first bite of meal)      Lab Results  Component Value Date   GLUCAP 186 (H) 11/07/2020   HGBA1C 13.1 (A) 03/19/2018    Review of Glycemic Control Results for RUWAYDA, CURET (MRN [name]) as of 11/07/2020 08:28  Ref. Range 11/06/2020 19:37 11/06/2020 20:01 11/06/2020 22:20 11/07/2020 05:04  Glucose-Capillary Latest Ref Range: 70 - 99 mg/dL 67 (L) 88 11/09/2020 (H) 811 (H)   Diabetes history: Type 2 DM Outpatient Diabetes medications: NPH 20 units QM, 10 units QHS Current orders for Inpatient glycemic control: NPH 38 units BID, Novolog 0-14 units TID, Novolog 24 units TID, Metformin 500 mg BID BMZ x 2   Inpatient Diabetes Program Recommendations:    Per RN progress note patient had poor appetite last night and was given crackers; no insulin due to <50% of meal intake. FSBG elevated this AM. Attributing to mild low of 67 mg/dL and interventions. Will continue to follow with current orders.  Addendum: Given hypoglycemia following postpranidal, consider reducing meal coverage to Novolog 22 units TID.   Thanks, [phone], MSN, RNC-OB Diabetes Coordinator [phone] (8a-5p)

## 2020-11-07 NOTE — Progress Notes (Signed)
Patient ID: Martha [name], female   DOB: 08-Jun-1985, 35 y.o.   MRN: [id number] FACULTY PRACTICE ANTEPARTUM(COMPREHENSIVE) NOTE  Martha [name] [name] is a 35 y.o. [id number] at [redacted]w[redacted]d by best clinical estimate who is admitted for [facility] with SIPE with severe features based on blood pressure.   Fetal presentation is cephalic. Length of Stay:  6  Days  ASSESSMENT: Principal Problem:   Severe pre-eclampsia Active Problems:   Diabetes mellitus affecting pregnancy, antepartum   Language barrier   Obesity affecting pregnancy, antepartum   Chronic hypertension affecting pregnancy   PLAN: CHTN with SIPE - stable Repeat labs 10/17 wnl - recheck based on symptoms or otherwise twice weekly.  BP control <160/110 with Procardia 60 BID and labetolol 600 Q8 and she has required some IV labetalol overnight but had not the full effect of BP medication changes. Today Bps have been mild range. We reviewed indications for delivery I.e. Persistent HA, two agents at max dose and persistent severe range Bps, etc Goal is for 34 weeks if can keep symptoms under control and control BP. Weekly BPP, NST daily   T2DM - improving NPH to 38u bid and novolog to 22 tid WC. Added Metformin on 11/03/20. Reviewed Diabetes Coordinator note. Will likely need to taper back down once steroid effect continues to decrease.   Language Barrier Spanish interpreter: Cicero Duck [phone]  Nausea and GERD  PPI  Has Zofran prn nausea   H/o SVD x 4  Subjective: Had HA this morning but resolved with tylenol. Denies BV or RUQ pain.  Patient reports the fetal movement as active. Patient reports uterine contraction  activity as none. Patient reports  vaginal bleeding as none. Patient describes fluid per vagina as None.  Vitals:  Blood pressure (!) 142/90, pulse 90, temperature 97.7 F (36.5 C), temperature source Oral, resp. rate 18, height 5\' 1"  (1.549 m), weight 90.7 kg, last menstrual period 04/01/2020, SpO2 99 %. Physical  Examination:  General appearance - alert, well appearing, and in no distress Chest - normal effort Abdomen - gravid, non-tender Fundal Height:  size equals dates Extremities: Homans sign is negative, no sign of DVT, normal DTRs, no clonus Membranes:intact  Fetal Monitoring:  Baseline: 130 bpm, Variability: Good {> 6 bpm), Accelerations: Reactive with 10x10 appropriate for GA, and Decelerations: Absent  Labs:  Results for orders placed or performed during the hospital encounter of 11/01/20 (from the past 24 hour(s))  Glucose, capillary   Collection Time: 11/06/20  1:57 PM  Result Value Ref Range   Glucose-Capillary 121 (H) 70 - 99 mg/dL  Glucose, capillary   Collection Time: 11/06/20  3:39 PM  Result Value Ref Range   Glucose-Capillary 165 (H) 70 - 99 mg/dL  Glucose, capillary   Collection Time: 11/06/20  7:37 PM  Result Value Ref Range   Glucose-Capillary 67 (L) 70 - 99 mg/dL  Glucose, capillary   Collection Time: 11/06/20  8:01 PM  Result Value Ref Range   Glucose-Capillary 88 70 - 99 mg/dL  Glucose, capillary   Collection Time: 11/06/20 10:20 PM  Result Value Ref Range   Glucose-Capillary 149 (H) 70 - 99 mg/dL  Glucose, capillary   Collection Time: 11/07/20  5:04 AM  Result Value Ref Range   Glucose-Capillary 186 (H) 70 - 99 mg/dL  Glucose, capillary   Collection Time: 11/07/20  9:57 AM  Result Value Ref Range   Glucose-Capillary 64 (L) 70 - 99 mg/dL  Glucose, capillary   Collection Time: 11/07/20 10:57 AM  Result Value Ref Range   Glucose-Capillary 84 70 - 99 mg/dL    Imaging Studies:    Last growth 10/14: 10%ile, 1400g, 3lb1oz, Normal UADs  Medications:  Scheduled  docusate sodium  100 mg Oral Daily   insulin aspart  0-14 Units Subcutaneous TID PC   insulin aspart  22 Units Subcutaneous TID WC   insulin NPH Human  38 Units Subcutaneous BID AC & HS   labetalol  600 mg Oral Q8H   metFORMIN  500 mg Oral BID WC   NIFEdipine  60 mg Oral BID   pantoprazole  40  mg Oral Daily   prenatal multivitamin  1 tablet Oral Q1200   I have reviewed the patient's current medications.   Milas Hock, MD 11/07/2020,12:21 PM

## 2020-11-07 NOTE — Progress Notes (Signed)
CBG 64 post prandial; apple juice given

## 2020-11-07 NOTE — Consult Note (Addendum)
MFM Consult   Martha [name] is a 35 yo G5P4 who is 44 w 3 d admitted for management of superimposed preeclampsia with severe features. I am consulted with Martha [name] at the request Dr. Para March.  Martha [name] is HD 6 as she was admitted for the above indication as well as management of T2DM She had HA, epigastric pain upon admission with severe range BP. Her labs were normal with exception of UPC of 15. In the MAU she required IV antihypertensive therapy and her HA resolved with tylenol.  Of concern for Dr. Para March is the high requirement of Martha [name]'s medical therapy. She is taking labetalol 600 TID and procardia 60 BID.  Since admission she has required an occasional IV labetalol due to spikes in her blood pressure.  Today Martha [name] reports that is feeling good. She had light headache this morning but it resolved. She denies N/V or RUQ pain.   Her T2DM has been managed inpatient by Lujean Rave. She is currently NPH 38/38 Novoloog 24TID. Overall numbers have been in range however , last night she had low calorie intake that effected her BS. We are in agreement with the blood sugar goals as noted.  Vitals with BMI 11/07/2020 11/07/2020 11/06/2020  Height - - -  Weight - - -  BMI - - -  Systolic 123 132 809  Diastolic 87 83 89  Pulse 92 90 78   CBC Latest Ref Rng & Units 11/03/2020 11/01/2020 10/11/2020  WBC 4.0 - 10.5 K/uL 12.4(H) 10.4 10.8  Hemoglobin 12.0 - 15.0 g/dL 98.3 38.2 50.5  Hematocrit 36.0 - 46.0 % 39.7 42.6 43.3  Platelets 150 - 400 K/uL 307 279 285   CMP Latest Ref Rng & Units 11/03/2020 11/01/2020 10/11/2020  Glucose 70 - 99 mg/dL 397(Q) 734(L) 937(T)  BUN 6 - 20 mg/dL 02(I) 8 7  Creatinine 0.97 - 1.00 mg/dL 3.53 2.99 2.42(A)  Sodium 135 - 145 mmol/L 132(L) 134(L) 135  Potassium 3.5 - 5.1 mmol/L 4.3 3.9 3.9  Chloride 98 - 111 mmol/L 100 104 101  CO2 22 - 32 mmol/L 20(L) 22 18(L)  Calcium 8.9 - 10.3 mg/dL 8.3(M) 1.9(Q) 9.1  Total Protein 6.5 - 8.1 g/dL 2.2(W) 9.7(L) 6.1   Total Bilirubin 0.3 - 1.2 mg/dL 8.9(Q) 0.3 <1.1  Alkaline Phos 38 - 126 U/L 73 71 76  AST 15 - 41 U/L 23 37 13  ALT 0 - 44 U/L 16 20 11     Current Facility-Administered Medications (Endocrine & Metabolic):    [CANCELED] CBG monitoring **AND** insulin aspart (novoLOG) injection 0-14 Units   insulin aspart (novoLOG) injection 24 Units   insulin NPH Human (NOVOLIN N) injection 38 Units   metFORMIN (GLUCOPHAGE) tablet 500 mg   Current Facility-Administered Medications (Cardiovascular):    labetalol (NORMODYNE) injection 20 mg **AND** labetalol (NORMODYNE) injection 40 mg **AND** labetalol (NORMODYNE) injection 80 mg **AND** hydrALAZINE (APRESOLINE) injection 10 mg **AND** [COMPLETED] Measure blood pressure   labetalol (NORMODYNE) tablet 600 mg   NIFEdipine (PROCARDIA XL/NIFEDICAL XL) 24 hr tablet 60 mg     Current Facility-Administered Medications (Analgesics):    acetaminophen (TYLENOL) tablet 650 mg     Current Facility-Administered Medications (Other):    calcium carbonate (TUMS - dosed in mg elemental calcium) chewable tablet 400 mg of elemental calcium   cyclobenzaprine (FLEXERIL) tablet 10 mg   docusate sodium (COLACE) capsule 100 mg   ondansetron (ZOFRAN) injection 4 mg   pantoprazole (PROTONIX) EC tablet 40 mg   prenatal  multivitamin tablet 1 tablet   zolpidem (AMBIEN) tablet 5 mg  No current outpatient medications on file.   Imaging: 11/02/20 EFW 3lb 1 oz 1400 g 10% AC 24th%. UA dopplers normal 43%  Impression/Counseling:  I reviewed Martha [name]'s plan of care with she and Dr. Para March. We spoke with Martha [name] via interpreter.   Martha [name] expressed an understanding of our diagnosis and management, that included delivery by 34 weeks if stable preeclampsia with severe features.   -We also will continue daily NST with weekly BPP. -Repeat growth in 2 weeks given that growth was performed on 10/14 -We recommend delivery if two agents are maximized and IV therapy is  required.  Continue T2DM management and goals with Lujean Rave.  All questions answered.  I spent 40 minutes with > 50% in face to face consultation.  Novella Olive, MD.

## 2020-11-07 NOTE — Progress Notes (Signed)
Hypoglycemic Event  CBG: 66  Treatment: Meal given  Symptoms: None  Follow-up CBG: Time:1711 CBG Result:87  Possible Reasons for Event: Medication regimen: (doses adjusted today. See Orders)    Samuella Cota

## 2020-11-08 ENCOUNTER — Inpatient Hospital Stay (HOSPITAL_BASED_OUTPATIENT_CLINIC_OR_DEPARTMENT_OTHER): Payer: Medicaid Other

## 2020-11-08 DIAGNOSIS — O365930 Maternal care for other known or suspected poor fetal growth, third trimester, not applicable or unspecified: Secondary | ICD-10-CM

## 2020-11-08 DIAGNOSIS — O133 Gestational [pregnancy-induced] hypertension without significant proteinuria, third trimester: Secondary | ICD-10-CM

## 2020-11-08 DIAGNOSIS — O24113 Pre-existing diabetes mellitus, type 2, in pregnancy, third trimester: Secondary | ICD-10-CM

## 2020-11-08 DIAGNOSIS — E119 Type 2 diabetes mellitus without complications: Secondary | ICD-10-CM

## 2020-11-08 DIAGNOSIS — Z3A31 31 weeks gestation of pregnancy: Secondary | ICD-10-CM

## 2020-11-08 DIAGNOSIS — O09523 Supervision of elderly multigravida, third trimester: Secondary | ICD-10-CM

## 2020-11-08 LAB — CBC WITH DIFFERENTIAL/PLATELET
Abs Immature Granulocytes: 0.09 10*3/uL — ABNORMAL HIGH (ref 0.00–0.07)
Basophils Absolute: 0 10*3/uL (ref 0.0–0.1)
Basophils Relative: 0 %
Eosinophils Absolute: 0.8 10*3/uL — ABNORMAL HIGH (ref 0.0–0.5)
Eosinophils Relative: 9 %
HCT: 41.3 % (ref 36.0–46.0)
Hemoglobin: 14.2 g/dL (ref 12.0–15.0)
Immature Granulocytes: 1 %
Lymphocytes Relative: 23 %
Lymphs Abs: 2.1 10*3/uL (ref 0.7–4.0)
MCH: 31.1 pg (ref 26.0–34.0)
MCHC: 34.4 g/dL (ref 30.0–36.0)
MCV: 90.4 fL (ref 80.0–100.0)
Monocytes Absolute: 0.9 10*3/uL (ref 0.1–1.0)
Monocytes Relative: 10 %
Neutro Abs: 5.1 10*3/uL (ref 1.7–7.7)
Neutrophils Relative %: 57 %
Platelets: 289 10*3/uL (ref 150–400)
RBC: 4.57 MIL/uL (ref 3.87–5.11)
RDW: 12.7 % (ref 11.5–15.5)
WBC: 9 10*3/uL (ref 4.0–10.5)
nRBC: 0 % (ref 0.0–0.2)

## 2020-11-08 LAB — GLUCOSE, CAPILLARY
Glucose-Capillary: 197 mg/dL — ABNORMAL HIGH (ref 70–99)
Glucose-Capillary: 103 mg/dL — ABNORMAL HIGH (ref 70–99)
Glucose-Capillary: 68 mg/dL — ABNORMAL LOW (ref 70–99)
Glucose-Capillary: 52 mg/dL — ABNORMAL LOW (ref 70–99)
Glucose-Capillary: 82 mg/dL (ref 70–99)
Glucose-Capillary: 117 mg/dL — ABNORMAL HIGH (ref 70–99)

## 2020-11-08 LAB — COMPREHENSIVE METABOLIC PANEL
ALT: 17 U/L (ref 0–44)
AST: 26 U/L (ref 15–41)
Albumin: 2 g/dL — ABNORMAL LOW (ref 3.5–5.0)
Alkaline Phosphatase: 75 U/L (ref 38–126)
Anion gap: 6 (ref 5–15)
BUN: 13 mg/dL (ref 6–20)
CO2: 19 mmol/L — ABNORMAL LOW (ref 22–32)
Calcium: 8.6 mg/dL — ABNORMAL LOW (ref 8.9–10.3)
Chloride: 108 mmol/L (ref 98–111)
Creatinine, Ser: 0.64 mg/dL (ref 0.44–1.00)
GFR, Estimated: >60 mL/min (ref >60)
Glucose, Bld: 93 mg/dL (ref 70–99)
Potassium: 4.6 mmol/L (ref 3.5–5.1)
Sodium: 133 mmol/L — ABNORMAL LOW (ref 135–145)
Total Bilirubin: 0.3 mg/dL (ref 0.3–1.2)
Total Protein: 5 g/dL — ABNORMAL LOW (ref 6.5–8.1)

## 2020-11-08 NOTE — Progress Notes (Signed)
Patient given a fruit bowl, will recheck CBG at 1850

## 2020-11-08 NOTE — Progress Notes (Signed)
Patient ID: Martha [name], female   DOB: 1985-11-28, 35 y.o.   MRN: [id number] FACULTY PRACTICE ANTEPARTUM(COMPREHENSIVE) NOTE  Martha [name] is a 35 y.o. [id number] at [redacted]w[redacted]d by best clinical estimate who is admitted for [facility] with SIPE with severe features based on blood pressure.   Fetal presentation is cephalic. Length of Stay:  7  Days  ASSESSMENT: Principal Problem:   Severe pre-eclampsia Active Problems:   Diabetes mellitus affecting pregnancy, antepartum   Language barrier   Obesity affecting pregnancy, antepartum   Chronic hypertension affecting pregnancy   PLAN: CHTN with SIPE - stable Repeat labs 10/20 wnl.  BP control <160/110 with Procardia 60 BID and labetolol 600 Q8. Today Bps have been mild range. We reviewed indications for delivery I.e. Persistent HA, two agents at max dose and persistent severe range Bps, etc Goal is for 34 weeks if can keep symptoms under control and control BP. Weekly BPP, NST daily   T2DM - improving NPH to 38u bid and novolog to 22 tid WC. Added Metformin on 11/03/20.    Language Barrier Spanish interpreter: Jenean Lindau [phone]  Nausea and GERD  PPI  Has Zofran prn nausea   H/o SVD x 4  Subjective: No complaints today. Denies HA. Denies BV or RUQ pain.  Patient reports the fetal movement as active. Patient reports uterine contraction  activity as none. Patient reports  vaginal bleeding as none. Patient describes fluid per vagina as None.  Vitals:  Blood pressure 135/86, pulse 90, temperature 97.7 F (36.5 C), temperature source Oral, resp. rate 18, height 5\' 1"  (1.549 m), weight 90.7 kg, last menstrual period 04/01/2020, SpO2 100 %. Physical Examination:  General appearance - alert, well appearing, and in no distress Chest - normal effort Abdomen - gravid, non-tender Fundal Height:  size equals dates Extremities: Homans sign is negative, no sign of DVT, normal DTRs, no clonus Membranes:intact  Fetal Monitoring:  Baseline: 130  bpm, Variability: Good {> 6 bpm), Accelerations: Reactive with 10x10 appropriate for GA, and Decelerations: Absent  Labs:  Results for orders placed or performed during the hospital encounter of 11/01/20 (from the past 24 hour(s))  Glucose, capillary   Collection Time: 11/07/20  1:31 PM  Result Value Ref Range   Glucose-Capillary 74 70 - 99 mg/dL  Glucose, capillary   Collection Time: 11/07/20  4:48 PM  Result Value Ref Range   Glucose-Capillary 66 (L) 70 - 99 mg/dL  Glucose, capillary   Collection Time: 11/07/20  5:11 PM  Result Value Ref Range   Glucose-Capillary 87 70 - 99 mg/dL  Glucose, capillary   Collection Time: 11/07/20  7:18 PM  Result Value Ref Range   Glucose-Capillary 87 70 - 99 mg/dL  Glucose, capillary   Collection Time: 11/07/20  9:34 PM  Result Value Ref Range   Glucose-Capillary 101 (H) 70 - 99 mg/dL  Glucose, capillary   Collection Time: 11/08/20  5:13 AM  Result Value Ref Range   Glucose-Capillary 197 (H) 70 - 99 mg/dL  Comprehensive metabolic panel   Collection Time: 11/08/20  9:40 AM  Result Value Ref Range   Sodium 133 (L) 135 - 145 mmol/L   Potassium 4.6 3.5 - 5.1 mmol/L   Chloride 108 98 - 111 mmol/L   CO2 19 (L) 22 - 32 mmol/L   Glucose, Bld 93 70 - 99 mg/dL   BUN 13 6 - 20 mg/dL   Creatinine, Ser 11/10/20 0.44 - 1.00 mg/dL   Calcium 8.6 (L) 8.9 -  10.3 mg/dL   Total Protein 5.0 (L) 6.5 - 8.1 g/dL   Albumin 2.0 (L) 3.5 - 5.0 g/dL   AST 26 15 - 41 U/L   ALT 17 0 - 44 U/L   Alkaline Phosphatase 75 38 - 126 U/L   Total Bilirubin 0.3 0.3 - 1.2 mg/dL   GFR, Estimated >96 >04 mL/min   Anion gap 6 5 - 15  CBC with Differential/Platelet   Collection Time: 11/08/20  9:40 AM  Result Value Ref Range   WBC 9.0 4.0 - 10.5 K/uL   RBC 4.57 3.87 - 5.11 MIL/uL   Hemoglobin 14.2 12.0 - 15.0 g/dL   HCT 54.0 98.1 - 19.1 %   MCV 90.4 80.0 - 100.0 fL   MCH 31.1 26.0 - 34.0 pg   MCHC 34.4 30.0 - 36.0 g/dL   RDW 47.8 29.5 - 62.1 %   Platelets 289 150 - 400 K/uL    nRBC 0.0 0.0 - 0.2 %   Neutrophils Relative % 57 %   Neutro Abs 5.1 1.7 - 7.7 K/uL   Lymphocytes Relative 23 %   Lymphs Abs 2.1 0.7 - 4.0 K/uL   Monocytes Relative 10 %   Monocytes Absolute 0.9 0.1 - 1.0 K/uL   Eosinophils Relative 9 %   Eosinophils Absolute 0.8 (H) 0.0 - 0.5 K/uL   Basophils Relative 0 %   Basophils Absolute 0.0 0.0 - 0.1 K/uL   Immature Granulocytes 1 %   Abs Immature Granulocytes 0.09 (H) 0.00 - 0.07 K/uL  Glucose, capillary   Collection Time: 11/08/20 11:04 AM  Result Value Ref Range   Glucose-Capillary 103 (H) 70 - 99 mg/dL    Imaging Studies:    Last growth 10/14: 10%ile, 1400g, 3lb1oz, Normal UADs 10/20: BPP pending, but cephalic  Medications:  Scheduled  docusate sodium  100 mg Oral Daily   insulin aspart  0-14 Units Subcutaneous TID PC   insulin aspart  22 Units Subcutaneous TID WC   insulin NPH Human  38 Units Subcutaneous BID AC & HS   labetalol  600 mg Oral Q8H   metFORMIN  500 mg Oral BID WC   NIFEdipine  60 mg Oral BID   pantoprazole  40 mg Oral Daily   prenatal multivitamin  1 tablet Oral Q1200   I have reviewed the patient's current medications.   Milas Hock, MD 11/08/2020,12:41 PM

## 2020-11-09 DIAGNOSIS — O119 Pre-existing hypertension with pre-eclampsia, unspecified trimester: Secondary | ICD-10-CM

## 2020-11-09 LAB — GLUCOSE, CAPILLARY
Glucose-Capillary: 126 mg/dL — ABNORMAL HIGH (ref 70–99)
Glucose-Capillary: 55 mg/dL — ABNORMAL LOW (ref 70–99)
Glucose-Capillary: 86 mg/dL (ref 70–99)
Glucose-Capillary: 120 mg/dL — ABNORMAL HIGH (ref 70–99)
Glucose-Capillary: 87 mg/dL (ref 70–99)
Glucose-Capillary: 80 mg/dL (ref 70–99)
Glucose-Capillary: 99 mg/dL (ref 70–99)
Glucose-Capillary: 99 mg/dL (ref 70–99)

## 2020-11-09 MED ORDER — INSULIN ASPART 100 UNIT/ML IJ SOLN
18.0000 [IU] | Freq: Three times a day (TID) | INTRAMUSCULAR | Status: DC
  Administered 2020-11-09 (×2): 18 [IU] via SUBCUTANEOUS

## 2020-11-09 MED ORDER — INSULIN NPH (HUMAN) (ISOPHANE) 100 UNIT/ML ~~LOC~~ SUSP
35.0000 [IU] | Freq: Two times a day (BID) | SUBCUTANEOUS | Status: DC
  Administered 2020-11-09: 35 [IU] via SUBCUTANEOUS

## 2020-11-09 NOTE — Progress Notes (Signed)
Fasting CBG this morning was 55. Pt asymptomatic, alert, and oriented x4. Pt given juice and crackers.15 min recheck was 126.

## 2020-11-09 NOTE — Progress Notes (Addendum)
Patient ID: Martha [name], female   DOB: 1985/03/30, 35 y.o.   MRN: [id number] FACULTY PRACTICE ANTEPARTUM(COMPREHENSIVE) NOTE  Martha [name] is a 35 y.o. [id number] at [redacted]w[redacted]d  by best clinical estimate who is admitted for [facility] with SIPE with severe features based on blood pressure.   Fetal presentation is cephalic. Length of Stay:  8  Days  ASSESSMENT: Principal Problem:   Severe pre-eclampsia Active Problems:   Diabetes mellitus affecting pregnancy, antepartum   Language barrier   Obesity affecting pregnancy, antepartum   Chronic hypertension affecting pregnancy   PLAN: CHTN with SIPE - stable Repeat labs 10/20 wnl.  BP control <160/110 with Procardia 60 BID and labetolol 600 Q8. Today Bps have been mild range. We reviewed indications for delivery I.e. Persistent HA, two agents at max dose and persistent severe range Bps, etc.  Goal is for 34 weeks if can keep symptoms under control and control BP. Weekly BPP, NST daily   T2DM - improving NPH to 35u bid and novolog to 18 tid WC. Added Metformin on 11/03/20.    Language Barrier Spanish interpreter was the in person interpreter  Nausea and GERD  PPI  Has Zofran prn nausea   H/o SVD x 4  Subjective: No complaints today. Denies HA. Denies BV or RUQ pain.  Patient reports the fetal movement as active. Patient reports uterine contraction  activity as none. Patient reports  vaginal bleeding as none. Patient describes fluid per vagina as None.  Vitals:  Blood pressure 136/86, pulse 93, temperature 97.8 F (36.6 C), temperature source Oral, resp. rate 18, height 5\' 1"  (1.549 m), weight 90.7 kg, last menstrual period 04/01/2020, SpO2 99 %. Physical Examination: General appearance - alert, well appearing, and in no distress Chest - normal effort Abdomen - gravid, non-tender Fundal Height:  size equals dates Extremities: Homans sign is negative, no sign of DVT, normal DTRs, no clonus Membranes:intact  Fetal Monitoring:   Baseline: 130 bpm, Variability: Good {> 6 bpm), Accelerations: Reactive with 10x10 appropriate for GA, and Decelerations: Absent  Labs:  Results for orders placed or performed during the hospital encounter of 11/01/20 (from the past 24 hour(s))  Glucose, capillary   Collection Time: 11/08/20  2:53 PM  Result Value Ref Range   Glucose-Capillary 68 (L) 70 - 99 mg/dL  Glucose, capillary   Collection Time: 11/08/20  6:29 PM  Result Value Ref Range   Glucose-Capillary 52 (L) 70 - 99 mg/dL  Glucose, capillary   Collection Time: 11/08/20  6:49 PM  Result Value Ref Range   Glucose-Capillary 82 70 - 99 mg/dL  Glucose, capillary   Collection Time: 11/08/20  9:49 PM  Result Value Ref Range   Glucose-Capillary 117 (H) 70 - 99 mg/dL  Glucose, capillary   Collection Time: 11/09/20  5:05 AM  Result Value Ref Range   Glucose-Capillary 55 (L) 70 - 99 mg/dL  Glucose, capillary   Collection Time: 11/09/20  5:21 AM  Result Value Ref Range   Glucose-Capillary 126 (H) 70 - 99 mg/dL  Glucose, capillary   Collection Time: 11/09/20  9:40 AM  Result Value Ref Range   Glucose-Capillary 86 70 - 99 mg/dL   CBC    Component Value Date/Time   WBC 9.0 11/08/2020 0940   RBC 4.57 11/08/2020 0940   HGB 14.2 11/08/2020 0940   HGB 14.5 10/11/2020 0917   HCT 41.3 11/08/2020 0940   HCT 43.3 10/11/2020 0917   PLT 289 11/08/2020 0940   PLT  285 10/11/2020 0917   MCV 90.4 11/08/2020 0940   MCV 93 10/11/2020 0917   MCH 31.1 11/08/2020 0940   MCHC 34.4 11/08/2020 0940   RDW 12.7 11/08/2020 0940   RDW 12.5 10/11/2020 0917   LYMPHSABS 2.1 11/08/2020 0940   LYMPHSABS 2.2 10/11/2020 0917   MONOABS 0.9 11/08/2020 0940   EOSABS 0.8 (H) 11/08/2020 0940   EOSABS 0.5 (H) 10/11/2020 0917   BASOSABS 0.0 11/08/2020 0940   BASOSABS 0.1 10/11/2020 0917   CMP Latest Ref Rng & Units 11/08/2020 11/03/2020 11/01/2020  Glucose 70 - 99 mg/dL 93 937(T) 024(O)  BUN 6 - 20 mg/dL 13 97(D) 8  Creatinine 0.44 - 1.00 mg/dL  5.32 9.92 4.26  Sodium 135 - 145 mmol/L 133(L) 132(L) 134(L)  Potassium 3.5 - 5.1 mmol/L 4.6 4.3 3.9  Chloride 98 - 111 mmol/L 108 100 104  CO2 22 - 32 mmol/L 19(L) 20(L) 22  Calcium 8.9 - 10.3 mg/dL 8.3(M) 1.9(Q) 2.2(W)  Total Protein 6.5 - 8.1 g/dL 5.0(L) 5.4(L) 5.4(L)  Total Bilirubin 0.3 - 1.2 mg/dL 0.3 9.7(L) 0.3  Alkaline Phos 38 - 126 U/L 75 73 71  AST 15 - 41 U/L 26 23 37  ALT 0 - 44 U/L 17 16 20      Imaging Studies:    Last growth 10/14: 10%ile, 1400g, 3lb1oz, Normal UADs 10/20: BPP 8/8, nl afi cephalic  Medications:  Scheduled  docusate sodium  100 mg Oral Daily   insulin aspart  0-14 Units Subcutaneous TID PC   insulin aspart  18 Units Subcutaneous TID WC   insulin NPH Human  35 Units Subcutaneous BID AC & HS   labetalol  600 mg Oral Q8H   metFORMIN  500 mg Oral BID WC   NIFEdipine  60 mg Oral BID   pantoprazole  40 mg Oral Daily   prenatal multivitamin  1 tablet Oral Q1200   I have reviewed the patient's current medications.   11/20, MD 11/09/2020,11:30 AM

## 2020-11-09 NOTE — Plan of Care (Signed)
  Problem: Health Behavior/Discharge Planning: Goal: Ability to manage health-related needs will improve Outcome: Progressing   Problem: Clinical Measurements: Goal: Ability to maintain clinical measurements within normal limits will improve Outcome: Progressing Goal: Diagnostic test results will improve Outcome: Progressing   Problem: Clinical Measurements: Goal: Diagnostic test results will improve Outcome: Progressing   Problem: Education: Goal: Knowledge of the prescribed therapeutic regimen will improve Outcome: Progressing Goal: Individualized Educational Video(s) Outcome: Progressing   Problem: Education: Goal: Individualized Educational Video(s) Outcome: Progressing   Problem: Clinical Measurements: Goal: Complications related to the disease process, condition or treatment will be avoided or minimized Outcome: Progressing   Problem: Education: Goal: Knowledge of disease or condition will improve Outcome: Progressing Goal: Knowledge of the prescribed therapeutic regimen will improve Outcome: Progressing   Problem: Education: Goal: Knowledge of the prescribed therapeutic regimen will improve Outcome: Progressing

## 2020-11-09 NOTE — Progress Notes (Signed)
MFM Follow up   DOS: 11/09/20 Referring provider: Milas Hock, MD. Reason for request:  Superimposed preeclampsia with T2DM  Martha [name] is seen today HD 19 at 32w 5d. She is overall doing well without complaints  She denies s/sx of preterm labor or preeclampsia.  Pregnancy Issues;  1) Superimposed preeclampsia. Stable today. Blood pressure range 127/93 to 155/97. She continues on Labetalol 600 TID and Procardia 60 BID with no requirement for IV therapy.  2) T2DM Again managed in concert with Lujean Rave, RN. Blood sugar is within range. NPH and Novolog reduced by 2-4 unites.   Vitals with BMI 11/09/2020 11/09/2020 11/09/2020  Height - - -  Weight - - -  BMI - - -  Systolic 145 136 440  Diastolic 94 86 96  Pulse 90 93 93   [place]. 10/20 BPP 8/8 with good fetal movement and amniotic fluid volume.    Impression/Plan  1) SIP -Continue  daily NST with weekly BPP. - remain inpatient until delivery. - Delivery at 34 weeks,, or if worsening labs, persistent CNS symptoms or requirement for IV antihypertensive therapy.  2) T2DM Continue current plan Titrate blood sugar for goal.  I spent 20 minutes with > 50% in face to face consultation and care coordination.  I discussed the plan of care with Dr. Marlaine Hind, MD.

## 2020-11-09 NOTE — Progress Notes (Signed)
Pt called out for RN, stating that she was feeling light-headed, had a headache, and had blurred vision after coming back to bed from the bathroom. VS WNL and CBG was 87. Notified Dr. Para March. Will check a follow-up CBG in 30 minutes per Dr. Para March. Follow-up CBG @ 1725: 80 Pt states she is feeling a little better and is waiting on her dinner tray. Will continue to monitor.

## 2020-11-09 NOTE — Progress Notes (Signed)
Inpatient Diabetes Program Recommendations  Diabetes Treatment Program Recommendations  ADA Standards of Care 2018 Diabetes in Pregnancy Target Glucose Ranges:  Fasting: 60 - 90 mg/dL Preprandial: 60 - 947 mg/dL 1 hr postprandial: Less than 140mg /dL (from first bite of meal) 2 hr postprandial: Less than 120 mg/dL (from first bite of meal)    Lab Results  Component Value Date   GLUCAP 126 (H) 11/09/2020   HGBA1C 13.1 (A) 03/19/2018    Review of Glycemic Control Results for DAVIDA, FALCONI (MRN [name]) as of 11/09/2020 08:29  Ref. Range 11/08/2020 18:29 11/08/2020 18:49 11/08/2020 21:49 11/09/2020 05:05 11/09/2020 05:21  Glucose-Capillary Latest Ref Range: 70 - 99 mg/dL 52 (L) 82 11/11/2020 (H) 55 (L) 126 (H)  Diabetes history: Type 2 DM Outpatient Diabetes medications: NPH 20 units QM, 10 units QHS Current orders for Inpatient glycemic control: NPH 38 units BID, Novolog 0-14 units TID, Novolog 22 units TID, Metformin 500 mg BID BMZ x 2   Inpatient Diabetes Program Recommendations:     Continue to further reduce meal coverage to Novolog 18 units TID (Assuming patient is consuming >50% of meals) and NPH QHS dose to 35 units.   Thanks, 947, MSN, RNC-OB Diabetes Coordinator [phone] (8a-5p)

## 2020-11-10 LAB — GLUCOSE, CAPILLARY
Glucose-Capillary: 104 mg/dL — ABNORMAL HIGH (ref 70–99)
Glucose-Capillary: 106 mg/dL — ABNORMAL HIGH (ref 70–99)
Glucose-Capillary: 142 mg/dL — ABNORMAL HIGH (ref 70–99)
Glucose-Capillary: 144 mg/dL — ABNORMAL HIGH (ref 70–99)
Glucose-Capillary: 153 mg/dL — ABNORMAL HIGH (ref 70–99)
Glucose-Capillary: 32 mg/dL — CL (ref 70–99)
Glucose-Capillary: 33 mg/dL — CL (ref 70–99)
Glucose-Capillary: 55 mg/dL — ABNORMAL LOW (ref 70–99)
Glucose-Capillary: 87 mg/dL (ref 70–99)
Glucose-Capillary: 94 mg/dL (ref 70–99)
Glucose-Capillary: 97 mg/dL (ref 70–99)
Glucose-Capillary: 98 mg/dL (ref 70–99)

## 2020-11-10 LAB — CBC WITH DIFFERENTIAL/PLATELET
Abs Immature Granulocytes: 0.08 10*3/uL — ABNORMAL HIGH (ref 0.00–0.07)
Basophils Absolute: 0.1 10*3/uL (ref 0.0–0.1)
Basophils Relative: 1 %
Eosinophils Absolute: 0.7 10*3/uL — ABNORMAL HIGH (ref 0.0–0.5)
Eosinophils Relative: 7 %
HCT: 41.1 % (ref 36.0–46.0)
Hemoglobin: 13.8 g/dL (ref 12.0–15.0)
Immature Granulocytes: 1 %
Lymphocytes Relative: 24 %
Lymphs Abs: 2.1 10*3/uL (ref 0.7–4.0)
MCH: 30.4 pg (ref 26.0–34.0)
MCHC: 33.6 g/dL (ref 30.0–36.0)
MCV: 90.5 fL (ref 80.0–100.0)
Monocytes Absolute: 0.8 10*3/uL (ref 0.1–1.0)
Monocytes Relative: 9 %
Neutro Abs: 5.1 10*3/uL (ref 1.7–7.7)
Neutrophils Relative %: 58 %
Platelets: 290 10*3/uL (ref 150–400)
RBC: 4.54 MIL/uL (ref 3.87–5.11)
RDW: 12.6 % (ref 11.5–15.5)
WBC: 8.8 10*3/uL (ref 4.0–10.5)
nRBC: 0 % (ref 0.0–0.2)

## 2020-11-10 LAB — COMPREHENSIVE METABOLIC PANEL
ALT: 19 U/L (ref 0–44)
AST: 27 U/L (ref 15–41)
Albumin: 2 g/dL — ABNORMAL LOW (ref 3.5–5.0)
Alkaline Phosphatase: 67 U/L (ref 38–126)
Anion gap: 9 (ref 5–15)
BUN: 14 mg/dL (ref 6–20)
CO2: 20 mmol/L — ABNORMAL LOW (ref 22–32)
Calcium: 9 mg/dL (ref 8.9–10.3)
Chloride: 107 mmol/L (ref 98–111)
Creatinine, Ser: 0.65 mg/dL (ref 0.44–1.00)
GFR, Estimated: >60 mL/min (ref >60)
Glucose, Bld: 124 mg/dL — ABNORMAL HIGH (ref 70–99)
Potassium: 4 mmol/L (ref 3.5–5.1)
Sodium: 136 mmol/L (ref 135–145)
Total Bilirubin: 0.4 mg/dL (ref 0.3–1.2)
Total Protein: 5.3 g/dL — ABNORMAL LOW (ref 6.5–8.1)

## 2020-11-10 LAB — TROPONIN I (HIGH SENSITIVITY)
Troponin I (High Sensitivity): 5 ng/L (ref <18)
Troponin I (High Sensitivity): 5 ng/L (ref <18)

## 2020-11-10 MED ORDER — NIFEDIPINE 10 MG PO CAPS
10.0000 mg | ORAL_CAPSULE | ORAL | Status: DC | PRN
  Administered 2020-11-10 – 2020-11-14 (×3): 10 mg via ORAL
  Filled 2020-11-10 (×4): qty 1

## 2020-11-10 MED ORDER — NIFEDIPINE 10 MG PO CAPS
20.0000 mg | ORAL_CAPSULE | ORAL | Status: DC | PRN
  Administered 2020-11-10: 20 mg via ORAL
  Filled 2020-11-10: qty 2

## 2020-11-10 MED ORDER — FUROSEMIDE 10 MG/ML IJ SOLN
20.0000 mg | Freq: Once | INTRAMUSCULAR | Status: AC
  Administered 2020-11-10: 20 mg via INTRAVENOUS
  Filled 2020-11-10: qty 2

## 2020-11-10 MED ORDER — LABETALOL HCL 200 MG PO TABS
800.0000 mg | ORAL_TABLET | Freq: Three times a day (TID) | ORAL | Status: DC
  Administered 2020-11-10 – 2020-11-17 (×22): 800 mg via ORAL
  Filled 2020-11-10 (×13): qty 4
  Filled 2020-11-10 (×2): qty 8
  Filled 2020-11-10 (×7): qty 4

## 2020-11-10 MED ORDER — LABETALOL HCL 5 MG/ML IV SOLN: 40.0000 mg | INTRAVENOUS | Status: DC | PRN

## 2020-11-10 MED ORDER — NIFEDIPINE 10 MG PO CAPS: 20.0000 mg | ORAL_CAPSULE | ORAL | Status: DC | PRN

## 2020-11-10 MED ORDER — GLUCAGON HCL RDNA (DIAGNOSTIC) 1 MG IJ SOLR
INTRAMUSCULAR | Status: AC
  Administered 2020-11-10: 1 mg
  Filled 2020-11-10: qty 1

## 2020-11-10 MED ORDER — INSULIN NPH (HUMAN) (ISOPHANE) 100 UNIT/ML ~~LOC~~ SUSP
25.0000 [IU] | Freq: Two times a day (BID) | SUBCUTANEOUS | Status: DC
  Administered 2020-11-10 – 2020-11-11 (×3): 25 [IU] via SUBCUTANEOUS

## 2020-11-10 MED ORDER — INSULIN ASPART 100 UNIT/ML IJ SOLN
10.0000 [IU] | Freq: Three times a day (TID) | INTRAMUSCULAR | Status: DC
  Administered 2020-11-10 (×2): 10 [IU] via SUBCUTANEOUS

## 2020-11-10 MED ORDER — GLUCAGON HCL RDNA (DIAGNOSTIC) 1 MG IJ SOLR: 1.0000 mg | Freq: Once | INTRAMUSCULAR | Status: DC | PRN

## 2020-11-10 NOTE — Progress Notes (Signed)
Hypoglycemic Event  CBG: 55  Treatment: pt received meal tray Symptoms: headache, blurred vision  Follow-up CBG: Time: 0821 CBG Result: 87  Possible Reasons for Event: inadequate meal intake, medication regimen     Laury Axon

## 2020-11-10 NOTE — Progress Notes (Addendum)
FACULTY PRACTICE ANTEPARTUM(COMPREHENSIVE) NOTE  HARVEY LINGO Soc is a 35 y.o. [phone] with Estimated Date of Delivery: 01/06/21   By  LMP [redacted]w[redacted]d  who is admitted for [facility] with superimposed preeclampsia with severe features.    **Spanish interpreter used  Fetal presentation is cephalic. Length of Stay:  9  Days  Date of admission:11/01/2020  Subjective: Pt sitting comfortably in bed.  Notes mild headache 3/10 and blurry vision this am.  No RUQ pain. Patient reports the fetal movement as active. Patient reports uterine contraction  activity as none. Patient reports  vaginal bleeding as none. Patient describes fluid per vagina as None.  Vitals:  Blood pressure (!) 136/95, pulse 83, temperature 98.1 F (36.7 C), temperature source Oral, resp. rate 16, height 5\' 1"  (1.549 m), weight 90.7 kg, last menstrual period 04/01/2020, SpO2 100 %. Vitals:   11/10/20 0126 11/10/20 0130 11/10/20 0411 11/10/20 0754  BP: (!) 159/96 (!) 149/92 127/74 (!) 136/95  Pulse: 71 74 76 83  Resp:   17 16  Temp:   97.7 F (36.5 C) 98.1 F (36.7 C)  TempSrc:   Oral Oral  SpO2:  100% 100% 100%  Weight:      Height:       Physical Examination:  General appearance - alert, well appearing, and in no distress Mental status - mood and behavior appropriate Chest - CTAB Heart - normal rate and regular rhythm Abdomen - soft and non-tender, uterus below umbilicus Extremities - 1+ pitting edema, no clonus, no calf tenderness bilaterally Skin - warm and dry   Fetal Monitoring:  Baseline: 120 bpm, Variability: moderate, Accelerations: +15x15 x2 present, and Decelerations: Absent    reactive  Labs:  Results for orders placed or performed during the hospital encounter of 11/01/20 (from the past 24 hour(s))  Glucose, capillary   Collection Time: 11/09/20  2:04 PM  Result Value Ref Range   Glucose-Capillary 120 (H) 70 - 99 mg/dL  Glucose, capillary   Collection Time: 11/09/20  4:19 PM  Result Value Ref Range    Glucose-Capillary 87 70 - 99 mg/dL  Glucose, capillary   Collection Time: 11/09/20  5:25 PM  Result Value Ref Range   Glucose-Capillary 80 70 - 99 mg/dL  Glucose, capillary   Collection Time: 11/09/20  6:45 PM  Result Value Ref Range   Glucose-Capillary 99 70 - 99 mg/dL  Glucose, capillary   Collection Time: 11/09/20  8:46 PM  Result Value Ref Range   Glucose-Capillary 99 70 - 99 mg/dL  Glucose, capillary   Collection Time: 11/10/20 12:07 AM  Result Value Ref Range   Glucose-Capillary 33 (LL) 70 - 99 mg/dL   Comment 1 Notify RN    Comment 2 Document in Chart   Glucose, capillary   Collection Time: 11/10/20 12:27 AM  Result Value Ref Range   Glucose-Capillary 32 (LL) 70 - 99 mg/dL   Comment 1 Notify RN    Comment 2 Document in Chart   Glucose, capillary   Collection Time: 11/10/20 12:51 AM  Result Value Ref Range   Glucose-Capillary 97 70 - 99 mg/dL  Glucose, capillary   Collection Time: 11/10/20  1:05 AM  Result Value Ref Range   Glucose-Capillary 106 (H) 70 - 99 mg/dL  CBC with Differential/Platelet   Collection Time: 11/10/20  1:24 AM  Result Value Ref Range   WBC 8.8 4.0 - 10.5 K/uL   RBC 4.54 3.87 - 5.11 MIL/uL   Hemoglobin 13.8 12.0 - 15.0 g/dL  HCT 41.1 36.0 - 46.0 %   MCV 90.5 80.0 - 100.0 fL   MCH 30.4 26.0 - 34.0 pg   MCHC 33.6 30.0 - 36.0 g/dL   RDW 29.5 18.8 - 41.6 %   Platelets 290 150 - 400 K/uL   nRBC 0.0 0.0 - 0.2 %   Neutrophils Relative % 58 %   Neutro Abs 5.1 1.7 - 7.7 K/uL   Lymphocytes Relative 24 %   Lymphs Abs 2.1 0.7 - 4.0 K/uL   Monocytes Relative 9 %   Monocytes Absolute 0.8 0.1 - 1.0 K/uL   Eosinophils Relative 7 %   Eosinophils Absolute 0.7 (H) 0.0 - 0.5 K/uL   Basophils Relative 1 %   Basophils Absolute 0.1 0.0 - 0.1 K/uL   Immature Granulocytes 1 %   Abs Immature Granulocytes 0.08 (H) 0.00 - 0.07 K/uL  Comprehensive metabolic panel   Collection Time: 11/10/20  1:24 AM  Result Value Ref Range   Sodium 136 135 - 145 mmol/L    Potassium 4.0 3.5 - 5.1 mmol/L   Chloride 107 98 - 111 mmol/L   CO2 20 (L) 22 - 32 mmol/L   Glucose, Bld 124 (H) 70 - 99 mg/dL   BUN 14 6 - 20 mg/dL   Creatinine, Ser 6.06 0.44 - 1.00 mg/dL   Calcium 9.0 8.9 - 30.1 mg/dL   Total Protein 5.3 (L) 6.5 - 8.1 g/dL   Albumin 2.0 (L) 3.5 - 5.0 g/dL   AST 27 15 - 41 U/L   ALT 19 0 - 44 U/L   Alkaline Phosphatase 67 38 - 126 U/L   Total Bilirubin 0.4 0.3 - 1.2 mg/dL   GFR, Estimated >60 >10 mL/min   Anion gap 9 5 - 15  Troponin I (High Sensitivity)   Collection Time: 11/10/20  1:24 AM  Result Value Ref Range   Troponin I (High Sensitivity) 5 <18 ng/L  Glucose, capillary   Collection Time: 11/10/20  1:31 AM  Result Value Ref Range   Glucose-Capillary 153 (H) 70 - 99 mg/dL  Troponin I (High Sensitivity)   Collection Time: 11/10/20  3:18 AM  Result Value Ref Range   Troponin I (High Sensitivity) 5 <18 ng/L  Glucose, capillary   Collection Time: 11/10/20  4:55 AM  Result Value Ref Range   Glucose-Capillary 144 (H) 70 - 99 mg/dL  Glucose, capillary   Collection Time: 11/10/20  7:57 AM  Result Value Ref Range   Glucose-Capillary 55 (L) 70 - 99 mg/dL  Glucose, capillary   Collection Time: 11/10/20  8:21 AM  Result Value Ref Range   Glucose-Capillary 87 70 - 99 mg/dL  Glucose, capillary   Collection Time: 11/10/20 10:01 AM  Result Value Ref Range   Glucose-Capillary 142 (H) 70 - 99 mg/dL    Imaging Studies:    Last BPP 10/22- 8/8  Medications:  Scheduled  docusate sodium  100 mg Oral Daily   insulin aspart  0-14 Units Subcutaneous TID PC   insulin aspart  10 Units Subcutaneous TID WC   insulin NPH Human  25 Units Subcutaneous BID AC & HS   labetalol  600 mg Oral Q8H   metFORMIN  500 mg Oral BID WC   NIFEdipine  60 mg Oral BID   pantoprazole  40 mg Oral Daily   prenatal multivitamin  1 tablet Oral Q1200   I have reviewed the patient's current medications.  ASSESSMENT: [id number] [redacted]w[redacted]d Estimated Date of Delivery: 01/06/21   cHTN with  superimposed preeclampsia T2DM Obesity Language barrier  PLAN: 1) CHTN with superimposed preeclampsia -mild clinical symptoms noted this am, labs performed early this am, remain stable -BP remains stable- currently on Procardia 60mg  bid, Labetalol  600mg  q8hr -plan for tylenol this am and will continue to monitor -MFM following -BPP weekly and NST daily  2) T2DM -hypoglycemic event noted overnight -plan to decrease Novolog to 10u tid, would prefer SSI when needed to help avoid hypoglycemic events --continue NPH 25u bid and metformin 500mg  bid  DISP: Continue with in-house monitoring, plan for delivery @ 34wks or as clinically indicated for maternal and fetal indication  , DO Attending Obstetrician & Gynecologist, [facility] , [facility]

## 2020-11-10 NOTE — Significant Event (Signed)
Hypoglycemic Event  CBG: 33  Treatment: 8 oz juice/soda and Glucagon IM 1 mg  Symptoms: Pale, Sweaty, and Shaky  Follow-up CBGTime: 0027 (32), 0051 (97), 0105 [phone](142)  CBG Result: [phone]  Possible Reasons for Event: Inadequate meal intake and Medication regimen:    Comments/MD notified:Pratt MD notified    Joretta Bachelor

## 2020-11-10 NOTE — Progress Notes (Signed)
Patient ID: Martha [name], female   DOB: 01-01-1986, 35 y.o.   MRN: [id number] Asked to see patient secondary to low blood sugar at 33---given glucagon and CBG has rebounded nicely. Now she has developed elevated blood pressure, given labetalol x 2. She now reports chest pain and RUQ pain. Chest is tender to palpation. She is not really responding, but makes eye contact. If does not improve, consider CT head. Vitals:   11/09/20 1132 11/09/20 1607 11/09/20 2003 11/10/20 0004  BP: (!) 145/94 (!) 145/83 (!) 156/78 (!) 176/92  Pulse: 90 93 79 85  Resp: 18 18 17 17   Temp: 97.9 F (36.6 C) 98.3 F (36.8 C) 98 F (36.7 C) (!) 97.5 F (36.4 C)  TempSrc: Oral Oral Oral   SpO2: 99% 99% 99% 99%  Weight:      Height:        Will check labs and EKG and troponin. She has a lot of edema, will add lasix x 1. FHR is reassuring. Switch to po procardia to attempt to get her BP down as labetalol gives her a lot of these symptoms.

## 2020-11-11 DIAGNOSIS — O10913 Unspecified pre-existing hypertension complicating pregnancy, third trimester: Secondary | ICD-10-CM

## 2020-11-11 LAB — GLUCOSE, CAPILLARY
Glucose-Capillary: 80 mg/dL (ref 70–99)
Glucose-Capillary: 74 mg/dL (ref 70–99)
Glucose-Capillary: 92 mg/dL (ref 70–99)
Glucose-Capillary: 116 mg/dL — ABNORMAL HIGH (ref 70–99)
Glucose-Capillary: 114 mg/dL — ABNORMAL HIGH (ref 70–99)
Glucose-Capillary: 141 mg/dL — ABNORMAL HIGH (ref 70–99)

## 2020-11-11 LAB — TYPE AND SCREEN
ABO/RH(D): O POS
Antibody Screen: NEGATIVE

## 2020-11-11 MED ORDER — INSULIN NPH (HUMAN) (ISOPHANE) 100 UNIT/ML ~~LOC~~ SUSP
15.0000 [IU] | Freq: Two times a day (BID) | SUBCUTANEOUS | Status: DC
  Administered 2020-11-11 – 2020-11-12 (×2): 15 [IU] via SUBCUTANEOUS

## 2020-11-11 NOTE — Progress Notes (Addendum)
FACULTY PRACTICE ANTEPARTUM(COMPREHENSIVE) NOTE  Martha [name] Soc is a 35 y.o. [phone] at [redacted]w[redacted]d who is admitted for [facility] with superimposed preeclampsia with severe features.    **Spanish interpreter used  Fetal presentation is cephalic. Length of Stay:  10  Days  Date of admission:11/01/2020  Subjective: Pt sitting comfortably in bed.  Denies any headaches, visual symptoms, RUQ/epigastric pain or other concerning symptoms. Patient reports the fetal movement as active. Patient reports uterine contraction  activity as none. Patient reports  vaginal bleeding as none. Patient describes fluid per vagina as None.  Vitals:  Blood pressure (!) 144/86, pulse 86, temperature 98.4 F (36.9 C), temperature source Oral, resp. rate 18, height  (1.549 m), weight 90.7 kg, last menstrual period 04/01/2020, SpO2 100 %. Vitals:   11/11/20 0003 11/11/20 0358 11/11/20 0622 11/11/20 0833  BP: (!) 150/91 (!) 141/91  (!) 144/86  Pulse: 88 90 85 86  Resp: Temp: 98.2 F (36.8 C) 98 F (36.7 C)  98.4 F (36.9 C)  TempSrc: Oral Oral  Oral  SpO2: 100% 98%  100%  Weight:      Height:       Physical Examination: General - alert, well appearing, and in no distress Mental status - mood and behavior appropriate Chest - normal breath sounds Heart - normal rate and regular rhythm Abdomen - soft and non-tender, uterus below umbilicus Extremities - 1+ pitting edema, no clonus, no calf tenderness bilaterally Skin - warm and dry   Fetal Monitoring:  Baseline: 120 bpm, Variability: moderate, Accelerations: +15x15 x2 present, and Decelerations: Absent    >> Reactive x 2  Labs:  Results for orders placed or performed during the hospital encounter of 11/01/20 (from the past 24 hour(s))  Glucose, capillary   Collection Time: 11/10/20 10:01 AM  Result Value Ref Range   Glucose-Capillary 142 (H) 70 - 99 mg/dL  Glucose, capillary   Collection Time: 11/10/20  1:46 PM  Result Value Ref Range    Glucose-Capillary 104 (H) 70 - 99 mg/dL  Glucose, capillary   Collection Time: 11/10/20  8:16 PM  Result Value Ref Range   Glucose-Capillary 98 70 - 99 mg/dL  Glucose, capillary   Collection Time: 11/10/20 10:05 PM  Result Value Ref Range   Glucose-Capillary 94 70 - 99 mg/dL  Glucose, capillary   Collection Time: 11/11/20  5:10 AM  Result Value Ref Range   Glucose-Capillary 80 70 - 99 mg/dL  Glucose, capillary   Collection Time: 11/11/20  7:42 AM  Result Value Ref Range   Glucose-Capillary 74 70 - 99 mg/dL    Imaging Studies:    [place] MFM FETAL BPP WO NON STRESS  Result Date: 11/08/2020 ----------------------------------------------------------------------  OBSTETRICS REPORT                       (Signed Final 11/08/2020 01:18 pm) ---------------------------------------------------------------------- Patient Info  ID #:       [id number]                          D.O.B.:  04/10/1985 (35 yrs)  Name:       Martha [name]              Visit Date: 11/08/2020 09:43 am ---------------------------------------------------------------------- Performed By  Attending:        Lin Landsman      Referred By:      [name] MAU/Triage  MD  Performed By:     Sandi Mealy        [name]                                     [facility] ---------------------------------------------------------------------- Orders  #  Description                           Code        Ordered By  1  [place] MFM FETAL BPP WO NON               76819.01    PAULA DUNCAN     STRESS ----------------------------------------------------------------------  #  Order #                     Accession #                Episode #  1  [id number]                   [id number]                 [id number]  ---------------------------------------------------------------------- Indications  Gestational hypertension without significant   O13.2  proteinuria, second trimester  Pre-existing diabetes, type 2, in pregnancy,   O24.112  second trimester  Obesity complicating pregnancy, second         O99.212  trimester  Advanced maternal age multigravida 29+,        O73.522  second trimester  Encounter for antenatal screening,             Z36.9  unspecified  [redacted] weeks gestation of pregnancy                Z3A.31  Maternal care for known or suspected poor      O36.5920  fetal growth, second trimester, not applicable  or unspecified IUGR ---------------------------------------------------------------------- Fetal Evaluation  Num Of Fetuses:         1  Fetal Heart Rate(bpm):  124  Cardiac Activity:       Observed  Presentation:           Cephalic  Placenta:               Fundal  P. Cord Insertion:      Previously Visualized  Amniotic Fluid  AFI FV:      Within normal limits  AFI Sum(cm)     %Tile  Largest Pocket(cm)  19.2            72          5.8  RUQ(cm)       RLQ(cm)       LUQ(cm)        LLQ(cm)  5.1           3.4           5.8            4.9 ---------------------------------------------------------------------- Biophysical Evaluation  Amniotic F.V:   Within normal limits       F. Tone:        Observed  F. Movement:    Observed                   Score:          8/8  F. Breathing:   Observed ---------------------------------------------------------------------- OB History  Gravidity:    5         Term:   3        Prem:   1        SAB:   0  TOP:          0       Ectopic:  0        Living: 4 ---------------------------------------------------------------------- Gestational Age  LMP:           31w 4d        Date:  04/01/20                 EDD:   01/06/21  Best:          31w 4d     Det. By:  LMP  (04/01/20)          EDD:   01/06/21 ---------------------------------------------------------------------- Anatomy  Cranium:                Appears normal         Bladder:                Appears normal  Thoracic:              Appears normal ---------------------------------------------------------------------- Impression  Antenatal testing performed given maternal superiposed  preeclampsia with severe features and poorly controlled  T2DM.  The biophysical profile was 8/8 with good fetal movement and  amniotic fluid volume.  Recent MFM consultation performed 10/19 ---------------------------------------------------------------------- Recommendations  Continue daily NST  Weekly BPP  with plan for delivery by 34 weeks. ----------------------------------------------------------------------               Lin Landsman, MD Electronically Signed Final Report   11/08/2020 01:18 pm ----------------------------------------------------------------------  [place] MFM OB Detail + 14 Weeks  Result Date: 11/02/2020 ----------------------------------------------------------------------  OBSTETRICS REPORT                       (Signed Final 11/02/2020 08:14 am) ---------------------------------------------------------------------- Patient Info  ID #:       [id number]                          D.O.B.:  [birth year] (35 yrs)  Name:       SHERESE HEYWARD [facility]              Visit Date: 11/01/2020 05:18 pm ---------------------------------------------------------------------- Performed By  Attending:        Noralee Space MD        Referred By:  [facility] MAU/Triage  Performed By:     Percell Boston          [name] ---------------------------------------------------------------------- Orders  #  Description                           Code        Ordered By  1  [place] MFM OB DETAIL +14 WK               76811.01    MATTHEW ECKSTAT  2  [place] MFM UA CORD DOPPLER                76820.02    MATTHEW ECKSTAT ----------------------------------------------------------------------  #  Order #                      Accession #                Episode #  1  [id number]                   [id number]                 [id number]  2  [id number]                   [id number]                 [id number] ---------------------------------------------------------------------- Indications  Gestational hypertension without significant   O13.2  proteinuria, second trimester  Pre-existing diabetes, type 2, in pregnancy,   O24.112  second trimester  [redacted] weeks gestation of pregnancy                Z3A.30  Obesity complicating pregnancy, second         O19.212  trimester  Advanced maternal age multigravida 95+,        O40.522  second trimester  Encounter for antenatal screening,             Z36.9  unspecified  Maternal care for known or suspected poor      O36.5920  fetal growth, second trimester, not applicable  or unspecified IUGR ---------------------------------------------------------------------- Fetal Evaluation  Num Of Fetuses:         1  Fetal Heart Rate(bpm):  135  Cardiac Activity:       Observed  Presentation:           Cephalic  Placenta:               Fundal  P. Cord Insertion:      Not well visualized  Amniotic Fluid  AFI FV:      Within normal limits  AFI Sum(cm)     %Tile       Largest Pocket(cm)  15.8            57          7.1  RUQ(cm)       RLQ(cm)       LUQ(cm)        LLQ(cm)  7.1  4.7           0              4 ---------------------------------------------------------------------- Biometry  BPD:      73.2  mm     G. Age:  29w 3d         10  %    CI:         74.3   %    70 - 86                                                          FL/HC:      20.4   %    19.3 - 21.3  HC:      269.6  mm     G. Age:  29w 3d        2.4  %    HC/AC:      1.05        0.96 - 1.17  AC:      256.3  mm     G. Age:  29w 6d         24  %    FL/BPD:     75.1   %    71 - 87  FL:         55  mm     G. Age:  29w 0d          6  %    FL/AC:      21.5   %    20 - 24  HUM:      45.9  mm     G. Age:  27w 0d        < 5  %  Est. FW:    1400  gm       3 lb 1 oz     10  % ---------------------------------------------------------------------- OB History  Gravidity:    5         Term:   3        Prem:   1        SAB:   0  TOP:          0       Ectopic:  0        Living: 4 ---------------------------------------------------------------------- Gestational Age  LMP:           30w 4d        Date:  04/01/20                 EDD:   01/06/21  U/S Today:     29w 3d                                        EDD:   01/14/21  Best:          30w 4d     Det. By:  LMP  (04/01/20)          EDD:   01/06/21 ---------------------------------------------------------------------- Anatomy  Cranium:               Appears normal         LVOT:  Not well visualized  Cavum:                 Appears normal         Aortic Arch:            Appears normal  Ventricles:            Appears normal         Ductal Arch:            Not well visualized  Choroid Plexus:        Not well visualized    Diaphragm:              Appears normal  Cerebellum:            Appears normal         Stomach:                Appears normal, left                                                                        sided  Posterior Fossa:       Appears normal         Abdomen:                Appears normal  Nuchal Fold:           Not well visualized    Abdominal Wall:         Not well visualized  Face:                  Appears normal         Cord Vessels:           Appears normal (3                         (orbits and profile)                           vessel cord)  Lips:                  Appears normal         Kidneys:                Appear normal  Palate:                Not well visualized    Bladder:                Appears normal  Thoracic:              Appears normal         Spine:                  Appears normal  Heart:                 Not well visualized    Upper Extremities:      Visualized  RVOT:                  Not well visualized    Lower Extremities:      Visualized  Other:  Technically difficult  due to maternal habitus and fetal position. ---------------------------------------------------------------------- Doppler - Fetal Vessels  Umbilical Artery   S/D     %tile      RI    %tile      PI    %tile            ADFV    RDFV   2.68       43    0.63       48    0.96       54                N       N ---------------------------------------------------------------------- Cervix Uterus Adnexa  Cervix  Not visualized (advanced GA >24wks)  Uterus  No abnormality visualized.  Right Ovary  No adnexal mass visualized.  Left Ovary  No adnexal mass visualized.  Cul De Sac  No free fluid seen.  Adnexa  No abnormality visualized. ---------------------------------------------------------------------- Impression  Patient is admitted with severe range blood pressures seen  at office visit. Superimposed preeclampsia. She has poorly-  controlled pregestational diabetes.  On ultrasound, the estimated fetal weight is at the 10th  percentile. Amniotic fluid is normal and good fetal activity is  seen.Fetal anatomical survey appears normal but limited by  advanced gestational age. Umbilical artery Doppler showed  normal forward diastolic flow . ---------------------------------------------------------------------- Recommendations  -Weekly BPP from next week till deiivery. ----------------------------------------------------------------------                  Noralee Space, MD Electronically Signed Final Report   11/02/2020 08:14 am ----------------------------------------------------------------------  [place] MFM UA CORD DOPPLER  Result Date: 11/02/2020 ----------------------------------------------------------------------  OBSTETRICS REPORT                       (Signed Final 11/02/2020 08:14 am) ---------------------------------------------------------------------- Patient Info  ID #:       [id number]                          D.O.B.:  06-21-1985 (35 yrs)  Name:       DORTHEY DEPACE [facility]              Visit Date: 11/01/2020 05:18 pm  ---------------------------------------------------------------------- Performed By  Attending:        Noralee Space MD        Referred By:      [name] MAU/Triage  Performed By:     Percell Boston          [name] ---------------------------------------------------------------------- Orders  #  Description                           Code        Ordered By  1  [place] MFM OB DETAIL +14 WK               40981.19    MATTHEW ECKSTAT  2  [place] MFM UA CORD DOPPLER  16109.60    MATTHEW ECKSTAT ----------------------------------------------------------------------  #  Order #                     Accession #                Episode #  1  [id number]                   [id number]                 [id number]  2  [id number]                   [id number]                 [id number] ---------------------------------------------------------------------- Indications  Gestational hypertension without significant   O13.2  proteinuria, second trimester  Pre-existing diabetes, type 2, in pregnancy,   O24.112  second trimester  [redacted] weeks gestation of pregnancy                Z3A.30  Obesity complicating pregnancy, second         O99.212  trimester  Advanced maternal age multigravida 80+,        O6.522  second trimester  Encounter for antenatal screening,             Z36.9  unspecified  Maternal care for known or suspected poor      O36.5920  fetal growth, second trimester, not applicable  or unspecified IUGR ---------------------------------------------------------------------- Fetal Evaluation  Num Of Fetuses:         1  Fetal Heart Rate(bpm):  135  Cardiac Activity:       Observed  Presentation:           Cephalic  Placenta:               Fundal  P. Cord Insertion:      Not well visualized  Amniotic Fluid  AFI FV:      Within normal limits  AFI Sum(cm)     %Tile       Largest Pocket(cm)  15.8            57          7.1  RUQ(cm)       RLQ(cm)        LUQ(cm)        LLQ(cm)  7.1           4.7           0              4 ---------------------------------------------------------------------- Biometry  BPD:      73.2  mm     G. Age:  29w 3d         10  %    CI:         74.3   %    70 - 86                                                          FL/HC:      20.4   %    19.3 - 21.3  HC:      269.6  mm     G. Age:  76w 3d  2.4  %    HC/AC:      1.05        0.96 - 1.17  AC:      256.3  mm     G. Age:  29w 6d         24  %    FL/BPD:     75.1   %    71 - 87  FL:         55  mm     G. Age:  29w 0d          6  %    FL/AC:      21.5   %    20 - 24  HUM:      45.9  mm     G. Age:  27w 0d        < 5  %  Est. FW:    1400  gm      3 lb 1 oz     10  % ---------------------------------------------------------------------- OB History  Gravidity:    5         Term:   3        Prem:   1        SAB:   0  TOP:          0       Ectopic:  0        Living: 4 ---------------------------------------------------------------------- Gestational Age  LMP:           30w 4d        Date:  04/01/20                 EDD:   01/06/21  U/S Today:     29w 3d                                        EDD:   01/14/21  Best:          30w 4d     Det. By:  LMP  (04/01/20)          EDD:   01/06/21 ---------------------------------------------------------------------- Anatomy  Cranium:               Appears normal         LVOT:                   Not well visualized  Cavum:                 Appears normal         Aortic Arch:            Appears normal  Ventricles:            Appears normal         Ductal Arch:            Not well visualized  Choroid Plexus:        Not well visualized    Diaphragm:              Appears normal  Cerebellum:            Appears normal         Stomach:                Appears normal, left  sided  Posterior Fossa:       Appears normal         Abdomen:                Appears normal  Nuchal Fold:           Not well  visualized    Abdominal Wall:         Not well visualized  Face:                  Appears normal         Cord Vessels:           Appears normal (3                         (orbits and profile)                           vessel cord)  Lips:                  Appears normal         Kidneys:                Appear normal  Palate:                Not well visualized    Bladder:                Appears normal  Thoracic:              Appears normal         Spine:                  Appears normal  Heart:                 Not well visualized    Upper Extremities:      Visualized  RVOT:                  Not well visualized    Lower Extremities:      Visualized  Other:  Technically difficult due to maternal habitus and fetal position. ---------------------------------------------------------------------- Doppler - Fetal Vessels  Umbilical Artery   S/D     %tile      RI    %tile      PI    %tile            ADFV    RDFV   2.68       43    0.63       48    0.96       54                N       N ---------------------------------------------------------------------- Cervix Uterus Adnexa  Cervix  Not visualized (advanced GA >24wks)  Uterus  No abnormality visualized.  Right Ovary  No adnexal mass visualized.  Left Ovary  No adnexal mass visualized.  Cul De Sac  No free fluid seen.  Adnexa  No abnormality visualized. ---------------------------------------------------------------------- Impression  Patient is admitted with severe range blood pressures seen  at office visit. Superimposed preeclampsia. She has poorly-  controlled pregestational diabetes.  On ultrasound, the estimated fetal weight is at the 10th  percentile. Amniotic fluid is normal and good fetal activity is  seen.Fetal anatomical survey appears normal but limited by  advanced gestational age. Umbilical artery Doppler showed  normal forward diastolic flow . ---------------------------------------------------------------------- Recommendations  -Weekly BPP from next week till  deiivery. ----------------------------------------------------------------------                  Noralee Space, MD Electronically Signed Final Report   11/02/2020 08:14 am ----------------------------------------------------------------------    Medications:  Scheduled  docusate sodium  100 mg Oral Daily   insulin aspart  0-14 Units Subcutaneous TID PC   insulin NPH Human  15 Units Subcutaneous BID AC & HS   labetalol  800 mg Oral Q8H   metFORMIN  500 mg Oral BID WC   NIFEdipine  60 mg Oral BID   pantoprazole  40 mg Oral Daily   prenatal multivitamin  1 tablet Oral Q1200   I have reviewed the patient's current medications.  ASSESSMENT: [id number] Estimated Date of Delivery: 01/06/21  cHTN with superimposed preeclampsia T2DM Obesity Language barrier  PLAN: 1) CHTN with superimposed preeclampsia -BP remains stable- currently on Procardia 60mg  bid, Labetalol  600mg  q8hr -MFM following   2) T2DM -continues to have low blood sugars -decreased NPH to 15u bid, continue RISS.  Discontinued mealtime Novolog as it has been held a lot.   3) FWB Category 1 FHR tacing -BPP weekly and NST daily  DISP: Continue with in-house monitoring, plan for delivery @ 34wks or as clinically indicated for maternal and fetal indication  , MD, FACOG Obstetrician & Gynecologist, for Jaynie Collins, [facility]

## 2020-11-11 NOTE — Progress Notes (Signed)
Lunch ordered for patient.

## 2020-11-12 DIAGNOSIS — O24113 Pre-existing diabetes mellitus, type 2, in pregnancy, third trimester: Secondary | ICD-10-CM

## 2020-11-12 LAB — GLUCOSE, CAPILLARY
Glucose-Capillary: 49 mg/dL — ABNORMAL LOW (ref 70–99)
Glucose-Capillary: 111 mg/dL — ABNORMAL HIGH (ref 70–99)
Glucose-Capillary: 187 mg/dL — ABNORMAL HIGH (ref 70–99)
Glucose-Capillary: 193 mg/dL — ABNORMAL HIGH (ref 70–99)
Glucose-Capillary: 76 mg/dL (ref 70–99)
Glucose-Capillary: 147 mg/dL — ABNORMAL HIGH (ref 70–99)

## 2020-11-12 MED ORDER — HYDRALAZINE HCL 20 MG/ML IJ SOLN
10.0000 mg | Freq: Once | INTRAMUSCULAR | Status: AC
  Administered 2020-11-12: 10 mg via INTRAVENOUS
  Filled 2020-11-12: qty 1

## 2020-11-12 MED ORDER — HYDRALAZINE HCL 10 MG PO TABS
10.0000 mg | ORAL_TABLET | Freq: Once | ORAL | Status: AC
  Administered 2020-11-12: 10 mg via ORAL
  Filled 2020-11-12: qty 1

## 2020-11-12 MED ORDER — METFORMIN HCL 500 MG PO TABS
1000.0000 mg | ORAL_TABLET | Freq: Two times a day (BID) | ORAL | Status: DC
  Administered 2020-11-12 – 2020-11-20 (×15): 1000 mg via ORAL
  Filled 2020-11-12 (×15): qty 2

## 2020-11-12 NOTE — Progress Notes (Signed)
Hypoglycemic Event  CBG: 49  Treatment: 4 oz juice/soda  Symptoms:  headache   Follow-up CBG: Time: 0546 CBG Result: 111  Possible Reasons for Event: Inadequate meal intake  Comments/MD notified: Dr Myrla Halsted

## 2020-11-12 NOTE — Progress Notes (Signed)
Patient ID: Martha [name], female   DOB: January 10, 1986, 35 y.o.   MRN: [id number] FACULTY PRACTICE ANTEPARTUM(COMPREHENSIVE) NOTE  Martha [name] is a 35 y.o. [phone] with Estimated Date of Delivery: 01/06/21   By  early ultrasound [redacted]w[redacted]d  who is admitted for [facility] with SIPE, severe features, BP.    Fetal presentation is cephalic. Length of Stay:  11  Days  Date of admission:11/01/2020  Subjective: No complaints, felt sweaty when hyupoglycemic Patient reports the fetal movement as active. Patient reports uterine contraction  activity as none. Patient reports  vaginal bleeding as none. Patient describes fluid per vagina as None.  Vitals:  Blood pressure 133/84, pulse 84, temperature 98 F (36.7 C), temperature source Oral, resp. rate 16, height 5\' 1"  (1.549 m), weight 90.7 kg, last menstrual period 04/01/2020, SpO2 98 %. Vitals:   11/12/20 0414 11/12/20 0550 11/12/20 0551 11/12/20 0754  BP: 121/72 134/81  133/84  Pulse: 72 87 87 84  Resp: 16   16  Temp: 98 F (36.7 C)   98 F (36.7 C)  TempSrc: Oral   Oral  SpO2: 100%   98%  Weight:      Height:       Physical Examination:  General appearance - alert, well appearing, and in no distress Abdomen - soft, nontender, nondistended, no masses or organomegaly Fundal Height:  size equals dates Pelvic Exam:  examination not indicated Cervical Exam: Not evaluated. A Extremities: extremities normal, atraumatic, no cyanosis or edema with DTRs 2+ bilaterally Membranes:intact  Fetal Monitoring:  Baseline: 130s bpm, Variability: Good {> 6 bpm), Accelerations: Reactive, and Decelerations: Absent   reactive  Labs:  Results for orders placed or performed during the hospital encounter of 11/01/20 (from the past 24 hour(s))  Type and screen [facility]   Collection Time: 11/11/20  1:28 PM  Result Value Ref Range   ABO/RH(D) O POS    Antibody Screen NEG    Sample Expiration      11/14/2020,2359 Performed at [facility], [address], [place], [place]   Glucose, capillary   Collection Time: 11/11/20  3:28 PM  Result Value Ref Range   Glucose-Capillary 116 (H) 70 - 99 mg/dL  Glucose, capillary   Collection Time: 11/11/20  8:20 PM  Result Value Ref Range   Glucose-Capillary 114 (H) 70 - 99 mg/dL  Glucose, capillary   Collection Time: 11/11/20  9:57 PM  Result Value Ref Range   Glucose-Capillary 141 (H) 70 - 99 mg/dL  Glucose, capillary   Collection Time: 11/12/20  5:24 AM  Result Value Ref Range   Glucose-Capillary 49 (L) 70 - 99 mg/dL  Glucose, capillary   Collection Time: 11/12/20  5:46 AM  Result Value Ref Range   Glucose-Capillary 111 (H) 70 - 99 mg/dL  Glucose, capillary   Collection Time: 11/12/20  7:51 AM  Result Value Ref Range   Glucose-Capillary 187 (H) 70 - 99 mg/dL  Glucose, capillary   Collection Time: 11/12/20  9:34 AM  Result Value Ref Range   Glucose-Capillary 193 (H) 70 - 99 mg/dL    Imaging Studies:    No results found.   Medications:  Scheduled  docusate sodium  100 mg Oral Daily   insulin aspart  0-14 Units Subcutaneous TID PC   insulin NPH Human  15 Units Subcutaneous BID AC & HS   labetalol  800 mg Oral Q8H   metFORMIN  500 mg Oral BID WC  NIFEdipine  60 mg Oral BID   pantoprazole  40 mg Oral Daily   prenatal multivitamin  1 tablet Oral Q1200   I have reviewed the patient's current medications.  ASSESSMENT: [id number] [redacted]w[redacted]d Estimated Date of Delivery: 01/06/21  Patient Active Problem List   Diagnosis Date Noted   Supervision of high risk pregnancy, antepartum 11/01/2020   Severe pre-eclampsia 11/01/2020   Chronic hypertension affecting pregnancy 10/11/2020   Positive for macroalbuminuria 03/22/2018   Diabetes mellitus type 2, uncontrolled, with complications 03/19/2018   Essential hypertension 03/19/2018   Obesity affecting pregnancy, antepartum 03/19/2018   Diabetes mellitus affecting pregnancy, antepartum 08/10/2013   Language barrier  08/10/2013   Obesity 08/10/2013   History of depression 08/10/2013    Plan: No change in current care plan  1) CHTN with superimposed preeclampsia -BP remains stable- currently on Procardia 60mg  bid, Labetalol  600mg  q8hr -MFM following     2) T2DM -continues to have low blood sugars, BMZ effect is waning -decreased NPH to 15u bid, continue RISS.  Discontinued mealtime Novolog as it has been held a lot.    3) FWB Category 1 FHR tacing -BPP weekly and NST daily   DISP: Continue with in-house monitoring, plan for delivery @ 34wks or as clinically indicated for maternal and fetal indication   11/12/2020,11:06 AM

## 2020-11-12 NOTE — Progress Notes (Signed)
Inpatient Diabetes Program Recommendations  Diabetes Treatment Program Recommendations  ADA Standards of Care 2018 Diabetes in Pregnancy Target Glucose Ranges:  Fasting: 60 - 90 mg/dL Preprandial: 60 - 329 mg/dL 1 hr postprandial: Less than 140mg /dL (from first bite of meal) 2 hr postprandial: Less than 120 mg/dL (from first bite of meal)      Lab Results  Component Value Date   GLUCAP 187 (H) 11/12/2020   HGBA1C 13.1 (A) 03/19/2018    Review of Glycemic Control Results for Martha [name], Martha [name] (MRN [name]) as of 11/12/2020 08:42  Ref. Range 11/11/2020 21:57 11/12/2020 05:24 11/12/2020 05:46 11/12/2020 07:51  Glucose-Capillary Latest Ref Range: 70 - 99 mg/dL 11/14/2020 (H) 49 (L) 962 (H) 187 (H)  Diabetes history: Type 2 DM Outpatient Diabetes medications: NPH 20 units QM, 10 units QHS Current orders for Inpatient glycemic control: NPH 15 units BID, Novolog 0-14 units TID, Metformin 500 mg BID BMZ x 2   Inpatient Diabetes Program Recommendations:    Noted hypoglycemia this AM of 49 mg/dL and continued hypoglycemic events over the weekend despite reductions to insulin.  Would continue to further reduce NPH to 8 units QHS and continue with NPH 15 units QD.   Thanks, 229, MSN, RNC-OB Diabetes Coordinator [phone] (8a-5p)

## 2020-11-13 LAB — GLUCOSE, CAPILLARY
Glucose-Capillary: 110 mg/dL — ABNORMAL HIGH (ref 70–99)
Glucose-Capillary: 101 mg/dL — ABNORMAL HIGH (ref 70–99)
Glucose-Capillary: 154 mg/dL — ABNORMAL HIGH (ref 70–99)
Glucose-Capillary: 98 mg/dL (ref 70–99)
Glucose-Capillary: 173 mg/dL — ABNORMAL HIGH (ref 70–99)

## 2020-11-13 NOTE — Plan of Care (Signed)
  Problem: Health Behavior/Discharge Planning: Goal: Ability to manage health-related needs will improve Outcome: Progressing   Problem: Clinical Measurements: Goal: Diagnostic test results will improve Outcome: Progressing   Problem: Education: Goal: Knowledge of the prescribed therapeutic regimen will improve Outcome: Progressing Goal: Individualized Educational Video(s) Outcome: Progressing   Problem: Clinical Measurements: Goal: Complications related to the disease process, condition or treatment will be avoided or minimized Outcome: Progressing   Problem: Education: Goal: Knowledge of disease or condition will improve Outcome: Progressing Goal: Knowledge of the prescribed therapeutic regimen will improve Outcome: Progressing

## 2020-11-13 NOTE — Progress Notes (Signed)
Patient ID: Martha [name], female   DOB: [birth year], 35 y.o.   MRN: [id number] FACULTY PRACTICE ANTEPARTUM(COMPREHENSIVE) NOTE  Martha [name] is a 35 y.o. [phone] with Estimated Date of Delivery: 01/06/21   By  early ultrasound [redacted]w[redacted]d  who is admitted for [facility] with SIPE, severe features, BP requiring intermittent IV foses of BP med.    Fetal presentation is cephalic. Length of Stay:  12  Days  Date of admission:11/01/2020  Subjective: No complaints Patient reports the fetal movement as active. Patient reports uterine contraction  activity as none. Patient reports  vaginal bleeding as none. Patient describes fluid per vagina as None.  Vitals:  Blood pressure (!) 146/86, pulse 79, temperature 97.8 F (36.6 C), temperature source Oral, resp. rate 16, height 5\' 1"  (1.549 m), weight 90.7 kg, last menstrual period 04/01/2020, SpO2 98 %. Vitals:   11/12/20 2108 11/13/20 0115 11/13/20 0510 11/13/20 0825  BP: (!) 158/87 140/86 135/89 (!) 146/86  Pulse: 84 91 93 79  Resp: 18 17 18 16   Temp: 98 F (36.7 C)  98.1 F (36.7 C) 97.8 F (36.6 C)  TempSrc: Oral  Oral Oral  SpO2: 100% 98% 100% 98%  Weight:      Height:       Physical Examination:  General appearance - alert, well appearing, and in no distress Abdomen - soft, nontender, nondistended, no masses or organomegaly Fundal Height:  size equals dates Pelvic Exam:  examination not indicated Cervical Exam: Not evaluated. Extremities: extremities normal, atraumatic, no cyanosis or edema with DTRs 2+ bilaterally Membranes:intact  Fetal Monitoring:  Baseline: 130 bpm, Variability: Good {> 6 bpm), Accelerations: Reactive, and Decelerations: Absent   reactive  Labs:  Results for orders placed or performed during the hospital encounter of 11/01/20 (from the past 24 hour(s))  Glucose, capillary   Collection Time: 11/12/20  2:07 PM  Result Value Ref Range   Glucose-Capillary 76 70 - 99 mg/dL  Glucose, capillary   Collection Time:  11/12/20  7:46 PM  Result Value Ref Range   Glucose-Capillary 147 (H) 70 - 99 mg/dL  Glucose, capillary   Collection Time: 11/12/20 10:12 PM  Result Value Ref Range   Glucose-Capillary 110 (H) 70 - 99 mg/dL  Glucose, capillary   Collection Time: 11/13/20  5:07 AM  Result Value Ref Range   Glucose-Capillary 101 (H) 70 - 99 mg/dL  Glucose, capillary   Collection Time: 11/13/20  9:26 AM  Result Value Ref Range   Glucose-Capillary 154 (H) 70 - 99 mg/dL    Imaging Studies:    No results found.   Medications:  Scheduled  docusate sodium  100 mg Oral Daily   insulin aspart  0-14 Units Subcutaneous TID PC   labetalol  800 mg Oral Q8H   metFORMIN  1,000 mg Oral BID WC   NIFEdipine  60 mg Oral BID   pantoprazole  40 mg Oral Daily   prenatal multivitamin  1 tablet Oral Q1200   I have reviewed the patient's current medications.  ASSESSMENT: 11/15/20 [redacted]w[redacted]d Estimated Date of Delivery: 01/06/21  Patient Active Problem List   Diagnosis Date Noted   Supervision of high risk pregnancy, antepartum 11/01/2020   Severe pre-eclampsia 11/01/2020   Chronic hypertension affecting pregnancy 10/11/2020   Positive for macroalbuminuria 03/22/2018   Diabetes mellitus type 2, uncontrolled, with complications 03/19/2018   Essential hypertension 03/19/2018   Obesity affecting pregnancy, antepartum 03/19/2018   Diabetes mellitus affecting pregnancy, antepartum 08/10/2013   Language barrier  08/10/2013   Obesity 08/10/2013   History of depression 08/10/2013    PLAN:       1) CHTN with superimposed preeclampsia -BP remains stable- currently on Procardia 60mg  bid, Labetalol  600mg  q8hr, intermittent IV doses    2) T2DM -Metformin 1000 po BID   3) FWB Category 1 FHR tacing -BPP weekly and NST daily   DISP: Continue with in-house monitoring, plan for delivery @ 34wks or as clinically indicated for maternal and fetal indication  Rajendra Spiller 11/13/2020,10:37 AM

## 2020-11-14 LAB — GLUCOSE, CAPILLARY
Glucose-Capillary: 108 mg/dL — ABNORMAL HIGH (ref 70–99)
Glucose-Capillary: 122 mg/dL — ABNORMAL HIGH (ref 70–99)
Glucose-Capillary: 123 mg/dL — ABNORMAL HIGH (ref 70–99)
Glucose-Capillary: 146 mg/dL — ABNORMAL HIGH (ref 70–99)
Glucose-Capillary: 225 mg/dL — ABNORMAL HIGH (ref 70–99)
Glucose-Capillary: 76 mg/dL (ref 70–99)

## 2020-11-14 LAB — TYPE AND SCREEN
ABO/RH(D): O POS
Antibody Screen: NEGATIVE

## 2020-11-14 NOTE — Progress Notes (Signed)
Patient ID: Joaquin Bend Soc, female   DOB: 11/06/1985, 35 y.o.   MRN: [id number]  FACULTY PRACTICE ANTEPARTUM(COMPREHENSIVE) NOTE  TARAHJI RAMTHUN Soc is a 35 y.o. [phone] at [redacted]w[redacted]d  who is admitted for [facility] with SIPE, severe features, BP requiring intermittent IV foses of BP med.    Fetal presentation is cephalic. Length of Stay:  13  Days  Date of admission:11/01/2020  Subjective: Patient reports feeling well and is without complaints Patient reports the fetal movement as active. Patient reports uterine contraction  activity as none. Patient reports  vaginal bleeding as none. Patient describes fluid per vagina as None.  Vitals:  Blood pressure 102/60, pulse 98, temperature 98 F (36.7 C), temperature source Oral, resp. rate 18, height 5\' 1"  (1.549 m), weight 90.7 kg, last menstrual period 04/01/2020, SpO2 100 %. Vitals:   11/14/20 0429 11/14/20 0434 11/14/20 0458 11/14/20 0459  BP: (!) 176/86 (!) 179/92 102/60 102/60  Pulse: 68 75 97 98  Resp:      Temp:      TempSrc:      SpO2:      Weight:      Height:       Physical Examination:  General appearance - alert, well appearing, and in no distress Abdomen - soft, nontender, nondistended, no masses or organomegaly Fundal Height:  size equals dates Pelvic Exam:  examination not indicated Cervical Exam: Not evaluated. Extremities: extremities normal, atraumatic, no cyanosis or edema with DTRs 2+ bilaterally Membranes:intact  Fetal Monitoring:  Baseline: 125 bpm, Variability: Good {> 6 bpm), Accelerations: Reactive, and Decelerations: Absent   reactive  Labs:  Results for orders placed or performed during the hospital encounter of 11/01/20 (from the past 24 hour(s))  Glucose, capillary   Collection Time: 11/13/20  9:26 AM  Result Value Ref Range   Glucose-Capillary 154 (H) 70 - 99 mg/dL  Glucose, capillary   Collection Time: 11/13/20  1:45 PM  Result Value Ref Range   Glucose-Capillary 98 70 - 99 mg/dL  Glucose, capillary    Collection Time: 11/13/20  9:35 PM  Result Value Ref Range   Glucose-Capillary 173 (H) 70 - 99 mg/dL  Glucose, capillary   Collection Time: 11/13/20 11:55 PM  Result Value Ref Range   Glucose-Capillary 76 70 - 99 mg/dL  Glucose, capillary   Collection Time: 11/14/20 12:46 AM  Result Value Ref Range   Glucose-Capillary 123 (H) 70 - 99 mg/dL  Glucose, capillary   Collection Time: 11/14/20  4:56 AM  Result Value Ref Range   Glucose-Capillary 122 (H) 70 - 99 mg/dL    Imaging Studies:    No results found.   Medications:  Scheduled  docusate sodium  100 mg Oral Daily   insulin aspart  0-14 Units Subcutaneous TID PC   labetalol  800 mg Oral Q8H   metFORMIN  1,000 mg Oral BID WC   NIFEdipine  60 mg Oral BID   pantoprazole  40 mg Oral Daily   prenatal multivitamin  1 tablet Oral Q1200   I have reviewed the patient's current medications.  ASSESSMENT: 11/16/20 [redacted]w[redacted]d Estimated Date of Delivery: 01/06/21  Patient Active Problem List   Diagnosis Date Noted   Supervision of high risk pregnancy, antepartum 11/01/2020   Severe pre-eclampsia 11/01/2020   Chronic hypertension affecting pregnancy 10/11/2020   Positive for macroalbuminuria 03/22/2018   Diabetes mellitus type 2, uncontrolled, with complications 03/19/2018   Essential hypertension 03/19/2018   Obesity affecting pregnancy, antepartum 03/19/2018   Diabetes  mellitus affecting pregnancy, antepartum 08/10/2013   Language barrier 08/10/2013   Obesity 08/10/2013   History of depression 08/10/2013    PLAN:       1) CHTN with superimposed preeclampsia -BP remains stable- currently on Procardia 60mg  bid, Labetalol  600mg  q8hr, intermittent IV doses    2) T2DM - Continue close CBG monitoring -Metformin 1000 po BID   3) FWB Category 1 FHR tacing -BPP weekly and NST daily   Continue with in-house monitoring Plan for delivery @ 34wks or as clinically indicated for maternal and fetal indication  Shamieka Gullo 11/14/2020,7:42 AM

## 2020-11-15 ENCOUNTER — Telehealth: Payer: Self-pay | Admitting: Radiology

## 2020-11-15 LAB — GLUCOSE, CAPILLARY
Glucose-Capillary: 130 mg/dL — ABNORMAL HIGH (ref 70–99)
Glucose-Capillary: 127 mg/dL — ABNORMAL HIGH (ref 70–99)
Glucose-Capillary: 214 mg/dL — ABNORMAL HIGH (ref 70–99)
Glucose-Capillary: 180 mg/dL — ABNORMAL HIGH (ref 70–99)
Glucose-Capillary: 196 mg/dL — ABNORMAL HIGH (ref 70–99)

## 2020-11-15 MED ORDER — GLYBURIDE 5 MG PO TABS
5.0000 mg | ORAL_TABLET | Freq: Every day | ORAL | Status: DC
  Administered 2020-11-15 – 2020-11-16 (×2): 5 mg via ORAL
  Filled 2020-11-15 (×3): qty 1

## 2020-11-15 MED ORDER — HYDRALAZINE HCL 20 MG/ML IJ SOLN
10.0000 mg | INTRAMUSCULAR | Status: DC | PRN
  Administered 2020-11-15 – 2020-11-17 (×2): 10 mg via INTRAVENOUS
  Filled 2020-11-15: qty 1

## 2020-11-15 MED ORDER — LABETALOL HCL 5 MG/ML IV SOLN
20.0000 mg | INTRAVENOUS | Status: DC | PRN
  Filled 2020-11-15: qty 4

## 2020-11-15 MED ORDER — LABETALOL HCL 5 MG/ML IV SOLN: 40.0000 mg | INTRAVENOUS | Status: DC | PRN

## 2020-11-15 MED ORDER — HYDRALAZINE HCL 20 MG/ML IJ SOLN
5.0000 mg | INTRAMUSCULAR | Status: DC | PRN
  Administered 2020-11-15 – 2020-11-17 (×2): 5 mg via INTRAVENOUS
  Filled 2020-11-15 (×2): qty 1

## 2020-11-15 NOTE — Telephone Encounter (Signed)
Received fax from [facility] stating that they have tried multiply times to contact patient to schedule appointment and have been unsuccessful. Referral made by Dr Shawnie Pons.

## 2020-11-15 NOTE — Progress Notes (Signed)
Inpatient Diabetes Program Recommendations  Diabetes Treatment Program Recommendations  ADA Standards of Care 2018 Diabetes in Pregnancy Target Glucose Ranges:  Fasting: 60 - 90 mg/dL Preprandial: 60 - 326 mg/dL 1 hr postprandial: Less than 140mg /dL (from first bite of meal) 2 hr postprandial: Less than 120 mg/dL (from first bite of meal)    Lab Results  Component Value Date   GLUCAP 130 (H) 11/15/2020   HGBA1C 13.1 (A) 03/19/2018    Review of Glycemic Control Results for ARIAN, MCQUITTY (MRN [name]) as of 11/15/2020 08:38  Ref. Range 11/14/2020 08:40 11/14/2020 14:33 11/14/2020 21:58 11/15/2020 05:04  Glucose-Capillary Latest Ref Range: 70 - 99 mg/dL 11/17/2020 (H) 833 (H) 825 (H) 130 (H)   Diabetes history: Type 2 DM Outpatient Diabetes medications: NPH 20 units QM, 10 units QHS Current orders for Inpatient glycemic control: Novolog 0-14 units TID, Metformin 1000 mg BID BMZ x 2   Inpatient Diabetes Program Recommendations:    Insulin was discontinued due to hypoglycemia following dosing adjustments post steroids. However, feel patient requires low dose basal.  Consider adding back Levemir 14 units QD.  Thanks, 053, MSN, RNC-OB Diabetes Coordinator [phone] (8a-5p)

## 2020-11-15 NOTE — Progress Notes (Signed)
Patient ID: Martha [name], female   DOB: 07-17-1985, 35 y.o.   MRN: [id number] FACULTY PRACTICE ANTEPARTUM(COMPREHENSIVE) NOTE  Martha [name] is a 35 y.o. [phone] with Estimated Date of Delivery: 01/06/21   By  early ultrasound [redacted]w[redacted]d  who is admitted for [facility] w/SIPE, severe featrures, BP, requires episodic IV meds.    Fetal presentation is cephalic. Length of Stay:  14  Days  Date of admission:11/01/2020  Subjective: No complaints Patient reports the fetal movement as active. Patient reports uterine contraction  activity as none. Patient reports  vaginal bleeding as none. Patient describes fluid per vagina as None.  Vitals:  Blood pressure (!) 157/86, pulse 79, temperature 98.2 F (36.8 C), temperature source Oral, resp. rate 18, height 5\' 1"  (1.549 m), weight 90.7 kg, last menstrual period 04/01/2020, SpO2 97 %. Vitals:   11/15/20 0413 11/15/20 0630 11/15/20 0758 11/15/20 1128  BP: 129/78 (!) 147/89 108/68 (!) 157/86  Pulse: 91 82 78 79  Resp: 18  18 18   Temp: 98.1 F (36.7 C)  (!) 97.4 F (36.3 C) 98.2 F (36.8 C)  TempSrc: Oral  Oral Oral  SpO2: 96%  98% 97%  Weight:      Height:       Physical Examination:  General appearance - alert, well appearing, and in no distress Abdomen - soft, nontender, nondistended, no masses or organomegaly Fundal Height:  size equals dates Pelvic Exam:  examination not indicated Cervical Exam: Not evaluated.  Extremities: extremities normal, atraumatic, no cyanosis or edema with DTRs 2+ bilaterally Membranes:intact  Fetal Monitoring:  Baseline: 140 bpm, Variability: Good {> 6 bpm), Accelerations: Reactive, and Decelerations: Absent   reactive  Labs:  Results for orders placed or performed during the hospital encounter of 11/01/20 (from the past 24 hour(s))  Type and screen [facility]   Collection Time: 11/14/20  1:19 PM  Result Value Ref Range   ABO/RH(D) O POS    Antibody Screen NEG    Sample Expiration       11/17/2020,2359 Performed at [facility], [address] [place], [address] [place]   Glucose, capillary   Collection Time: 11/14/20  2:33 PM  Result Value Ref Range   Glucose-Capillary 225 (H) 70 - 99 mg/dL  Glucose, capillary   Collection Time: 11/14/20  9:58 PM  Result Value Ref Range   Glucose-Capillary 108 (H) 70 - 99 mg/dL  Glucose, capillary   Collection Time: 11/15/20  5:04 AM  Result Value Ref Range   Glucose-Capillary 130 (H) 70 - 99 mg/dL  Glucose, capillary   Collection Time: 11/15/20 11:26 AM  Result Value Ref Range   Glucose-Capillary 127 (H) 70 - 99 mg/dL    Imaging Studies:    No results found.   Medications:  Scheduled  docusate sodium  100 mg Oral Daily   glyBURIDE  5 mg Oral QHS   insulin aspart  0-14 Units Subcutaneous TID PC   labetalol  800 mg Oral Q8H   metFORMIN  1,000 mg Oral BID WC   NIFEdipine  60 mg Oral BID   pantoprazole  40 mg Oral Daily   prenatal multivitamin  1 tablet Oral Q1200   I have reviewed the patient's current medications.  ASSESSMENT: 11/17/20 [redacted]w[redacted]d Estimated Date of Delivery: 01/06/21  Patient Active Problem List   Diagnosis Date Noted   Supervision of high risk pregnancy, antepartum 11/01/2020   Severe pre-eclampsia 11/01/2020   Chronic hypertension affecting pregnancy 10/11/2020  Positive for macroalbuminuria 03/22/2018   Diabetes mellitus type 2, uncontrolled, with complications 03/19/2018   Essential hypertension 03/19/2018   Obesity affecting pregnancy, antepartum 03/19/2018   Diabetes mellitus affecting pregnancy, antepartum 08/10/2013   Language barrier 08/10/2013   Obesity 08/10/2013   History of depression 08/10/2013    PLAN: >Continue Procardia 60 BID, Labetalol 600 q8h, episodic doses of IV meds prn >Continue metformin 1000 mg BID, add glyburide 5 qhs(became consistently hypoglycemic on insulin) >weekly BPP, daily NST  Plan IOL 34 weeks or earlier as indicated  [employer] 11/15/2020,12:31  PM

## 2020-11-16 ENCOUNTER — Encounter: Payer: Self-pay | Admitting: Obstetrics & Gynecology

## 2020-11-16 ENCOUNTER — Inpatient Hospital Stay (HOSPITAL_BASED_OUTPATIENT_CLINIC_OR_DEPARTMENT_OTHER): Payer: Medicaid Other

## 2020-11-16 DIAGNOSIS — O24113 Pre-existing diabetes mellitus, type 2, in pregnancy, third trimester: Secondary | ICD-10-CM

## 2020-11-16 DIAGNOSIS — O1413 Severe pre-eclampsia, third trimester: Secondary | ICD-10-CM

## 2020-11-16 DIAGNOSIS — O09523 Supervision of elderly multigravida, third trimester: Secondary | ICD-10-CM

## 2020-11-16 DIAGNOSIS — O99213 Obesity complicating pregnancy, third trimester: Secondary | ICD-10-CM

## 2020-11-16 DIAGNOSIS — Z3A32 32 weeks gestation of pregnancy: Secondary | ICD-10-CM

## 2020-11-16 DIAGNOSIS — E669 Obesity, unspecified: Secondary | ICD-10-CM

## 2020-11-16 DIAGNOSIS — E119 Type 2 diabetes mellitus without complications: Secondary | ICD-10-CM

## 2020-11-16 LAB — GLUCOSE, CAPILLARY
Glucose-Capillary: 78 mg/dL (ref 70–99)
Glucose-Capillary: 106 mg/dL — ABNORMAL HIGH (ref 70–99)
Glucose-Capillary: 125 mg/dL — ABNORMAL HIGH (ref 70–99)
Glucose-Capillary: 275 mg/dL — ABNORMAL HIGH (ref 70–99)

## 2020-11-16 MED ORDER — SODIUM CHLORIDE 0.9% FLUSH
3.0000 mL | Freq: Two times a day (BID) | INTRAVENOUS | Status: DC
  Administered 2020-11-16 – 2020-11-17 (×2): 3 mL via INTRAVENOUS

## 2020-11-16 NOTE — Progress Notes (Signed)
Patient endorses +FM. Denies vaginal bleeding or LOF. Patient is comfortable, denies any contractions, HA or visual disturbances. POC discussed with patient. Patient verbalized understanding.

## 2020-11-16 NOTE — Progress Notes (Signed)
Patient ID: Martha [name], female   DOB: Jun 11, 1985, 35 y.o.   MRN: [id number] FACULTY PRACTICE ANTEPARTUM(COMPREHENSIVE) NOTE  Martha [name] is a 35 y.o. [phone] with Estimated Date of Delivery: 01/06/21   By  early ultrasound [redacted]w[redacted]d  who is admitted for [facility] w/SIPE, severe features, BP, headache.    Fetal presentation is cephalic. Length of Stay:  15  Days  Date of admission:11/01/2020  Subjective: No complaints except headache with IV antihypertensives Patient reports the fetal movement as active. Patient reports uterine contraction  activity as none. Patient reports  vaginal bleeding as none. Patient describes fluid per vagina as None.  Vitals:  Blood pressure 110/68, pulse 83, temperature 97.8 F (36.6 C), temperature source Oral, resp. rate 18, height 5\' 1"  (1.549 m), weight 90.7 kg, last menstrual period 04/01/2020, SpO2 97 %. Vitals:   11/15/20 2333 11/16/20 0434 11/16/20 0435 11/16/20 0831  BP: (!) 157/91 (!) 149/91  110/68  Pulse: 88 78  83  Resp: 18 18  18   Temp: 98.2 F (36.8 C) 97.9 F (36.6 C)  97.8 F (36.6 C)  TempSrc: Oral Oral  Oral  SpO2: 100% 100% 98% 97%  Weight:      Height:       Physical Examination:  General appearance - alert, well appearing, and in no distress Abdomen - soft, nontender, nondistended, no masses or organomegaly Fundal Height:  size equals dates Pelvic Exam:  examination not indicated Cervical Exam: Not evaluated.  Extremities: extremities normal, atraumatic, no cyanosis or edema with DTRs 2+ bilaterally Membranes:intact  Fetal Monitoring:  Baseline: 130 bpm, Variability: Good {> 6 bpm), Accelerations: Reactive, and Decelerations: Absent   reactive  Labs:  Results for orders placed or performed during the hospital encounter of 11/01/20 (from the past 24 hour(s))  Glucose, capillary   Collection Time: 11/15/20  2:13 PM  Result Value Ref Range   Glucose-Capillary 214 (H) 70 - 99 mg/dL  Glucose, capillary   Collection Time:  11/15/20  7:43 PM  Result Value Ref Range   Glucose-Capillary 180 (H) 70 - 99 mg/dL   Comment 1 Notify RN   Glucose, capillary   Collection Time: 11/15/20  9:54 PM  Result Value Ref Range   Glucose-Capillary 196 (H) 70 - 99 mg/dL  Glucose, capillary   Collection Time: 11/16/20  5:10 AM  Result Value Ref Range   Glucose-Capillary 78 70 - 99 mg/dL  Glucose, capillary   Collection Time: 11/16/20 10:22 AM  Result Value Ref Range   Glucose-Capillary 106 (H) 70 - 99 mg/dL   Comment 1 Notify RN    Comment 2 Document in Chart     Imaging Studies:    No results found.   Medications:  Scheduled  docusate sodium  100 mg Oral Daily   glyBURIDE  5 mg Oral QHS   insulin aspart  0-14 Units Subcutaneous TID PC   labetalol  800 mg Oral Q8H   metFORMIN  1,000 mg Oral BID WC   NIFEdipine  60 mg Oral BID   pantoprazole  40 mg Oral Daily   prenatal multivitamin  1 tablet Oral Q1200   sodium chloride flush  3 mL Intravenous Q12H   I have reviewed the patient's current medications.  ASSESSMENT: 11/18/20 [redacted]w[redacted]d Estimated Date of Delivery: 01/06/21  Patient Active Problem List   Diagnosis Date Noted   Supervision of high risk pregnancy, antepartum 11/01/2020   Severe pre-eclampsia 11/01/2020   Chronic hypertension affecting pregnancy 10/11/2020  Positive for macroalbuminuria 03/22/2018   Diabetes mellitus type 2, uncontrolled, with complications 03/19/2018   Essential hypertension 03/19/2018   Obesity affecting pregnancy, antepartum 03/19/2018   Diabetes mellitus affecting pregnancy, antepartum 08/10/2013   Language barrier 08/10/2013   Obesity 08/10/2013   History of depression 08/10/2013    PLAN: >Continue Procardia 60 BID, Labetalol 600 q8h, episodic doses of IV meds prn, yesterday 5 +10 mg apresoline >Continue metformin 1000 mg BID, add glyburide 5 qhs(became consistently hypoglycemic on insulin), fasting 78 this am which should lead to better glycemic control during the day, was  quite hypoglycemic when used insulin in low doses >weekly BPP, ordered for today, daily NST   Plan IOL 34 weeks or earlier as indicated  [employer] 11/16/2020,11:51 AM

## 2020-11-16 NOTE — Progress Notes (Signed)
Spoke to patient with help of in-house Spanish language interpreter.  Reviewed today's care plan, clarified questions from physical assessment, and answered patient's questions.

## 2020-11-17 ENCOUNTER — Inpatient Hospital Stay (HOSPITAL_COMMUNITY): Payer: Medicaid Other | Admitting: Anesthesiology

## 2020-11-17 ENCOUNTER — Encounter (HOSPITAL_COMMUNITY)
Admission: AD | Disposition: A | Discharge status: Home / Self Care | Payer: Self-pay | Source: Home / Self Care | Attending: Obstetrics and Gynecology

## 2020-11-17 ENCOUNTER — Encounter (HOSPITAL_COMMUNITY): Payer: Self-pay | Admitting: Family Medicine

## 2020-11-17 ENCOUNTER — Inpatient Hospital Stay (HOSPITAL_BASED_OUTPATIENT_CLINIC_OR_DEPARTMENT_OTHER): Payer: Medicaid Other

## 2020-11-17 DIAGNOSIS — O1414 Severe pre-eclampsia complicating childbirth: Secondary | ICD-10-CM

## 2020-11-17 DIAGNOSIS — O99213 Obesity complicating pregnancy, third trimester: Secondary | ICD-10-CM

## 2020-11-17 DIAGNOSIS — O2412 Pre-existing diabetes mellitus, type 2, in childbirth: Secondary | ICD-10-CM

## 2020-11-17 DIAGNOSIS — E669 Obesity, unspecified: Secondary | ICD-10-CM

## 2020-11-17 DIAGNOSIS — O09523 Supervision of elderly multigravida, third trimester: Secondary | ICD-10-CM

## 2020-11-17 DIAGNOSIS — Z3A32 32 weeks gestation of pregnancy: Secondary | ICD-10-CM

## 2020-11-17 DIAGNOSIS — O24113 Pre-existing diabetes mellitus, type 2, in pregnancy, third trimester: Secondary | ICD-10-CM

## 2020-11-17 DIAGNOSIS — E119 Type 2 diabetes mellitus without complications: Secondary | ICD-10-CM

## 2020-11-17 LAB — CBC WITH DIFFERENTIAL/PLATELET
Abs Immature Granulocytes: 0.06 10*3/uL (ref 0.00–0.07)
Abs Immature Granulocytes: 0.04 10*3/uL (ref 0.00–0.07)
Basophils Absolute: 0.1 10*3/uL (ref 0.0–0.1)
Basophils Absolute: 0.1 10*3/uL (ref 0.0–0.1)
Basophils Relative: 1 %
Basophils Relative: 1 %
Eosinophils Absolute: 0.4 10*3/uL (ref 0.0–0.5)
Eosinophils Absolute: 0.3 10*3/uL (ref 0.0–0.5)
Eosinophils Relative: 5 %
Eosinophils Relative: 4 %
HCT: 39.5 % (ref 36.0–46.0)
HCT: 40.8 % (ref 36.0–46.0)
Hemoglobin: 13.6 g/dL (ref 12.0–15.0)
Hemoglobin: 14 g/dL (ref 12.0–15.0)
Immature Granulocytes: 1 %
Immature Granulocytes: 1 %
Lymphocytes Relative: 27 %
Lymphocytes Relative: 30 %
Lymphs Abs: 2.3 10*3/uL (ref 0.7–4.0)
Lymphs Abs: 2.5 10*3/uL (ref 0.7–4.0)
MCH: 30.7 pg (ref 26.0–34.0)
MCH: 30.4 pg (ref 26.0–34.0)
MCHC: 34.4 g/dL (ref 30.0–36.0)
MCHC: 34.3 g/dL (ref 30.0–36.0)
MCV: 89.2 fL (ref 80.0–100.0)
MCV: 88.7 fL (ref 80.0–100.0)
Monocytes Absolute: 0.8 10*3/uL (ref 0.1–1.0)
Monocytes Absolute: 0.7 10*3/uL (ref 0.1–1.0)
Monocytes Relative: 9 %
Monocytes Relative: 9 %
Neutro Abs: 4.9 10*3/uL (ref 1.7–7.7)
Neutro Abs: 4.7 10*3/uL (ref 1.7–7.7)
Neutrophils Relative %: 57 %
Neutrophils Relative %: 55 %
Platelets: 209 10*3/uL (ref 150–400)
Platelets: 227 10*3/uL (ref 150–400)
RBC: 4.43 MIL/uL (ref 3.87–5.11)
RBC: 4.6 MIL/uL (ref 3.87–5.11)
RDW: 12.4 % (ref 11.5–15.5)
RDW: 12.4 % (ref 11.5–15.5)
WBC: 8.5 10*3/uL (ref 4.0–10.5)
WBC: 8.3 10*3/uL (ref 4.0–10.5)
nRBC: 0 % (ref 0.0–0.2)
nRBC: 0 % (ref 0.0–0.2)

## 2020-11-17 LAB — COMPREHENSIVE METABOLIC PANEL
ALT: 17 U/L (ref 0–44)
AST: 22 U/L (ref 15–41)
Albumin: 1.8 g/dL — ABNORMAL LOW (ref 3.5–5.0)
Alkaline Phosphatase: 92 U/L (ref 38–126)
Anion gap: 7 (ref 5–15)
BUN: 18 mg/dL (ref 6–20)
CO2: 19 mmol/L — ABNORMAL LOW (ref 22–32)
Calcium: 9.4 mg/dL (ref 8.9–10.3)
Chloride: 107 mmol/L (ref 98–111)
Creatinine, Ser: 0.68 mg/dL (ref 0.44–1.00)
GFR, Estimated: >60 mL/min (ref >60)
Glucose, Bld: 95 mg/dL (ref 70–99)
Potassium: 4.4 mmol/L (ref 3.5–5.1)
Sodium: 133 mmol/L — ABNORMAL LOW (ref 135–145)
Total Bilirubin: 0.6 mg/dL (ref 0.3–1.2)
Total Protein: 5 g/dL — ABNORMAL LOW (ref 6.5–8.1)

## 2020-11-17 LAB — PROTEIN / CREATININE RATIO, URINE
Creatinine, Urine: 39.91 mg/dL
Protein Creatinine Ratio: 7.72 mg/mg{Cre} — ABNORMAL HIGH (ref 0.00–0.15)
Total Protein, Urine: 308 mg/dL

## 2020-11-17 LAB — GLUCOSE, CAPILLARY
Glucose-Capillary: 101 mg/dL — ABNORMAL HIGH (ref 70–99)
Glucose-Capillary: 179 mg/dL — ABNORMAL HIGH (ref 70–99)
Glucose-Capillary: 78 mg/dL (ref 70–99)
Glucose-Capillary: 90 mg/dL (ref 70–99)
Glucose-Capillary: 94 mg/dL (ref 70–99)

## 2020-11-17 LAB — TYPE AND SCREEN
ABO/RH(D): O POS
Antibody Screen: NEGATIVE

## 2020-11-17 SURGERY — Surgical Case
Anesthesia: Spinal | Wound class: Clean Contaminated

## 2020-11-17 MED ORDER — PHENYLEPHRINE 40 MCG/ML (10ML) SYRINGE FOR IV PUSH (FOR BLOOD PRESSURE SUPPORT)
PREFILLED_SYRINGE | INTRAVENOUS | Status: AC
  Filled 2020-11-17: qty 10

## 2020-11-17 MED ORDER — LACTATED RINGERS IV SOLN: INTRAVENOUS | Status: DC

## 2020-11-17 MED ORDER — MORPHINE SULFATE (PF) 0.5 MG/ML IJ SOLN
INTRAMUSCULAR | Status: AC
  Filled 2020-11-17: qty 10

## 2020-11-17 MED ORDER — SOD CITRATE-CITRIC ACID 500-334 MG/5ML PO SOLN
30.0000 mL | ORAL | Status: AC
  Administered 2020-11-17: 30 mL via ORAL
  Filled 2020-11-17: qty 30

## 2020-11-17 MED ORDER — MORPHINE SULFATE (PF) 0.5 MG/ML IJ SOLN
INTRAMUSCULAR | Status: DC | PRN
  Administered 2020-11-17: 150 ug via INTRATHECAL

## 2020-11-17 MED ORDER — LACTATED RINGERS IV BOLUS
500.0000 mL | Freq: Once | INTRAVENOUS | Status: AC
  Administered 2020-11-17: 500 mL via INTRAVENOUS

## 2020-11-17 MED ORDER — FENTANYL CITRATE (PF) 100 MCG/2ML IJ SOLN
INTRAMUSCULAR | Status: DC | PRN
  Administered 2020-11-17: 15 ug via INTRATHECAL

## 2020-11-17 MED ORDER — POVIDONE-IODINE 10 % EX SWAB
2.0000 "application " | Freq: Once | CUTANEOUS | Status: AC
  Administered 2020-11-17: 2 via TOPICAL

## 2020-11-17 MED ORDER — CEFAZOLIN SODIUM-DEXTROSE 2-3 GM-%(50ML) IV SOLR
INTRAVENOUS | Status: DC | PRN
  Administered 2020-11-17: 2 g via INTRAVENOUS

## 2020-11-17 MED ORDER — DEXAMETHASONE SODIUM PHOSPHATE 4 MG/ML IJ SOLN
INTRAMUSCULAR | Status: AC
  Filled 2020-11-17: qty 1

## 2020-11-17 MED ORDER — OXYTOCIN-SODIUM CHLORIDE 30-0.9 UT/500ML-% IV SOLN
INTRAVENOUS | Status: AC
  Filled 2020-11-17: qty 500

## 2020-11-17 MED ORDER — BUPIVACAINE IN DEXTROSE 0.75-8.25 % IT SOLN
INTRATHECAL | Status: DC | PRN
  Administered 2020-11-17: 1.55 mL via INTRATHECAL

## 2020-11-17 MED ORDER — FENTANYL CITRATE (PF) 100 MCG/2ML IJ SOLN
INTRAMUSCULAR | Status: AC
  Filled 2020-11-17: qty 2

## 2020-11-17 MED ORDER — SODIUM CHLORIDE 0.9 % IR SOLN
Status: DC | PRN
  Administered 2020-11-17: 1000 mL

## 2020-11-17 MED ORDER — PHENYLEPHRINE HCL-NACL 20-0.9 MG/250ML-% IV SOLN
INTRAVENOUS | Status: AC
  Filled 2020-11-17: qty 250

## 2020-11-17 MED ORDER — PHENYLEPHRINE HCL-NACL 20-0.9 MG/250ML-% IV SOLN
INTRAVENOUS | Status: DC | PRN
  Administered 2020-11-17: 30 ug/min via INTRAVENOUS

## 2020-11-17 MED ORDER — OXYTOCIN-SODIUM CHLORIDE 30-0.9 UT/500ML-% IV SOLN
INTRAVENOUS | Status: DC | PRN
  Administered 2020-11-17: 400 [IU] via INTRAVENOUS

## 2020-11-17 MED ORDER — LACTATED RINGERS IV SOLN: INTRAVENOUS | Status: DC | PRN

## 2020-11-17 MED ORDER — SCOPOLAMINE 1 MG/3DAYS TD PT72
MEDICATED_PATCH | TRANSDERMAL | Status: AC
  Filled 2020-11-17: qty 1

## 2020-11-17 MED ORDER — SCOPOLAMINE 1 MG/3DAYS TD PT72
MEDICATED_PATCH | TRANSDERMAL | Status: DC | PRN
  Administered 2020-11-17: 1 via TRANSDERMAL

## 2020-11-17 MED ORDER — BUPIVACAINE HCL (PF) 0.5 % IJ SOLN
INTRAMUSCULAR | Status: DC | PRN
  Administered 2020-11-17: 30 mL

## 2020-11-17 MED ORDER — DEXAMETHASONE SODIUM PHOSPHATE 4 MG/ML IJ SOLN
INTRAMUSCULAR | Status: DC | PRN
  Administered 2020-11-17: 4 mg via INTRAVENOUS

## 2020-11-17 MED ORDER — ONDANSETRON HCL 4 MG/2ML IJ SOLN
INTRAMUSCULAR | Status: AC
  Filled 2020-11-17: qty 2

## 2020-11-17 MED ORDER — PHENYLEPHRINE HCL (PRESSORS) 10 MG/ML IV SOLN
INTRAVENOUS | Status: DC | PRN
  Administered 2020-11-17 (×2): 80 ug via INTRAVENOUS

## 2020-11-17 MED ORDER — BUPIVACAINE HCL (PF) 0.5 % IJ SOLN
INTRAMUSCULAR | Status: AC
  Filled 2020-11-17: qty 30

## 2020-11-17 MED ORDER — ONDANSETRON HCL 4 MG/2ML IJ SOLN
INTRAMUSCULAR | Status: DC | PRN
  Administered 2020-11-17: 4 mg via INTRAVENOUS

## 2020-11-17 SURGICAL SUPPLY — 32 items
BARRIER ADHS 3X4 INTERCEED (GAUZE/BANDAGES/DRESSINGS) IMPLANT
BENZOIN TINCTURE PRP APPL 2/3 (GAUZE/BANDAGES/DRESSINGS) ×2 IMPLANT
CHLORAPREP W/TINT 26ML (MISCELLANEOUS) ×2 IMPLANT
CLAMP CORD UMBIL (MISCELLANEOUS) IMPLANT
CLOTH BEACON ORANGE TIMEOUT ST (SAFETY) ×2 IMPLANT
CLSR STERI-STRIP ANTIMIC 1/2X4 (GAUZE/BANDAGES/DRESSINGS) ×2 IMPLANT
DRSG OPSITE POSTOP 4X10 (GAUZE/BANDAGES/DRESSINGS) ×2 IMPLANT
ELECT REM PT RETURN 9FT ADLT (ELECTROSURGICAL) ×2
ELECTRODE REM PT RTRN 9FT ADLT (ELECTROSURGICAL) ×1 IMPLANT
EXTRACTOR VACUUM KIWI (MISCELLANEOUS) IMPLANT
GLOVE BIO SURGEON STRL SZ 6.5 (GLOVE) ×2 IMPLANT
GLOVE BIOGEL PI IND STRL 7.0 (GLOVE) ×2 IMPLANT
GLOVE BIOGEL PI INDICATOR 7.0 (GLOVE) ×2
GOWN STRL REUS W/TWL LRG LVL3 (GOWN DISPOSABLE) ×4 IMPLANT
KIT ABG SYR 3ML LUER SLIP (SYRINGE) IMPLANT
NEEDLE HYPO 22GX1.5 SAFETY (NEEDLE) IMPLANT
NEEDLE HYPO 25X5/8 SAFETYGLIDE (NEEDLE) IMPLANT
NS IRRIG 1000ML POUR BTL (IV SOLUTION) ×2 IMPLANT
PACK C SECTION WH (CUSTOM PROCEDURE TRAY) ×2 IMPLANT
PAD OB MATERNITY 4.3X12.25 (PERSONAL CARE ITEMS) ×2 IMPLANT
PENCIL SMOKE EVAC W/HOLSTER (ELECTROSURGICAL) ×2 IMPLANT
RETRACTOR WND ALEXIS 25 LRG (MISCELLANEOUS) IMPLANT
RTRCTR WOUND ALEXIS 25CM LRG (MISCELLANEOUS)
SUT VIC AB 0 CT1 36 (SUTURE) ×12 IMPLANT
SUT VIC AB 2-0 CT1 27 (SUTURE) ×1
SUT VIC AB 2-0 CT1 TAPERPNT 27 (SUTURE) ×1 IMPLANT
SUT VIC AB 4-0 KS 27 (SUTURE) ×2 IMPLANT
SUT VIC AB 4-0 PS2 27 (SUTURE) ×2 IMPLANT
SYR CONTROL 10ML LL (SYRINGE) IMPLANT
TOWEL OR 17X24 6PK STRL BLUE (TOWEL DISPOSABLE) ×2 IMPLANT
TRAY FOLEY W/BAG SLVR 14FR LF (SET/KITS/TRAYS/PACK) IMPLANT
WATER STERILE IRR 1000ML POUR (IV SOLUTION) ×2 IMPLANT

## 2020-11-17 NOTE — Discharge Summary (Signed)
Postpartum Discharge Summary  Date of Service updated     Patient Name: Martha [name] Soc DOB: [birth year] MRN: [id number]  Date of admission: 11/01/2020 Delivery date:11/17/2020  Delivering provider: Woodroe Mode  Date of discharge: 11/20/2020  Admitting diagnosis: Severe pre-eclampsia [O14.10] Intrauterine pregnancy: [redacted]w[redacted]d    Secondary diagnosis:  Principal Problem:   Severe pre-eclampsia Active Problems:   Diabetes mellitus affecting pregnancy, antepartum   Language barrier   Obesity affecting pregnancy, antepartum   Chronic hypertension affecting pregnancy  Additional problems: None    Discharge diagnosis: Preterm Pregnancy Delivered, Preeclampsia (severe), CHTN, and Type 2 DM                                              Post partum procedures: None Augmentation: N/A Complications: None  Hospital course: Sceduled C/S   35y.o. yo [id number] admitted to the hospital 11/01/2020 for chronic hypertension with newly diagnosed superimposed pre-eclampsia with severe features (BP, HA). She was managed with Mg, BMZ, and an increased titrated regimen of procardia 6066m labetalol 800 TID. Her T2DM was managed with insulin/SSI. At [id number] previously reassuring antenatal testing now showed a BPP 6/10 (-2 for breathing and non-reactive NST), thus delivery was recommended. C-section was opted due to spontaneous decelerations and likelihood of fetal intolerance to labor. She had a scheduled cesarean section with the following indication:Non-Reassuring FHR on 11/17/2020.   Delivery details are as follows:  Membrane Rupture Time/Date: 10:38 PM ,11/17/2020   Delivery Method:C-Section, Low Transverse  Details of operation can be found in separate operative note.  Patient had an uncomplicated postpartum course.  She is ambulating, tolerating a regular diet, passing flatus, and urinating well. Patient is discharged home in stable condition on  11/20/20. Her blood pressure has  been well controlled on her medications and she has been tolerating the Metformin well.         Newborn Data: Birth date:11/17/2020  Birth time:10:39 PM  Gender:Female  Living status:Living  Apgars:7 ,9  Weight:1450 g     Magnesium Sulfate received: Yes: Neuroprotection and seizure prophylaxis  BMZ received: Yes Rhophylac:No MMR:No T-DaP:Given prenatally Flu: No Transfusion:No  Physical exam  Vitals:   11/19/20 1950 11/19/20 2256 11/20/20 0425 11/20/20 0810  BP: 135/76 124/77 105/79 128/84  Pulse: 88 89 81 100  Resp: '16 16 17 18  ' Temp: 98.6 F (37 C) 98.3 F (36.8 C) 98.1 F (36.7 C) 98.4 F (36.9 C)  TempSrc: Oral Oral Oral Oral  SpO2: 97% 95% 98% 99%  Weight:      Height:       General: alert, cooperative, and no distress Lochia: appropriate Uterine Fundus: firm Incision: Healing well with no significant drainage, No significant erythema, Dressing is clean, dry, and intact DVT Evaluation: No evidence of DVT seen on physical exam. Negative Homan's sign. No significant calf/ankle edema. Labs: Lab Results  Component Value Date   WBC 16.0 (H) 11/18/2020   HGB 12.7 11/18/2020   HCT 37.0 11/18/2020   MCV 89.6 11/18/2020   PLT 226 11/18/2020   CMP Latest Ref Rng & Units 11/19/2020  Glucose 70 - 99 mg/dL 100(H)  BUN 6 - 20 mg/dL 16  Creatinine 0.44 - 1.00 mg/dL 0.68  Sodium 135 - 145 mmol/L 135  Potassium 3.5 - 5.1 mmol/L 4.1  Chloride 98 - 111  mmol/L 104  CO2 22 - 32 mmol/L 23  Calcium 8.9 - 10.3 mg/dL 8.8(L)  Total Protein 6.5 - 8.1 g/dL -  Total Bilirubin 0.3 - 1.2 mg/dL -  Alkaline Phos 38 - 126 U/L -  AST 15 - 41 U/L -  ALT 0 - 44 U/L -   Edinburgh Score: Edinburgh Postnatal Depression Scale Screening Tool 11/20/2020  I have been able to laugh and see the funny side of things. 2  I have looked forward with enjoyment to things. 3  I have blamed myself unnecessarily when things went wrong. 2  I have been anxious or worried for no good reason. 2  I  have felt scared or panicky for no good reason. 2  Things have been getting on top of me. 1  I have been so unhappy that I have had difficulty sleeping. 0  I have felt sad or miserable. 0  I have been so unhappy that I have been crying. 1  The thought of harming myself has occurred to me. 0  Edinburgh Postnatal Depression Scale Total 13     After visit meds:  Allergies as of 11/20/2020       Reactions   Influenza Vaccines Itching, Rash        Medication List     STOP taking these medications    insulin NPH Human 100 UNIT/ML injection Commonly known as: NOVOLIN N   ondansetron 4 MG disintegrating tablet Commonly known as: Zofran ODT   promethazine 25 MG tablet Commonly known as: PHENERGAN       TAKE these medications    acetaminophen 500 MG tablet Commonly known as: TYLENOL Take 2 tablets (1,000 mg total) by mouth every 6 (six) hours.   dibucaine 1 % Oint Commonly known as: NUPERCAINAL Place 1 application rectally as needed for hemorrhoids.   furosemide 20 MG tablet Commonly known as: LASIX Take 1 tablet (20 mg total) by mouth 2 (two) times daily.   ibuprofen 600 MG tablet Commonly known as: ADVIL Take 1 tablet (600 mg total) by mouth every 6 (six) hours.   labetalol 200 MG tablet Commonly known as: NORMODYNE Take 2 tablets (400 mg total) by mouth 2 (two) times daily.   metFORMIN 1000 MG tablet Commonly known as: GLUCOPHAGE Take 1 tablet (1,000 mg total) by mouth 2 (two) times daily with a meal.   NIFEdipine 60 MG 24 hr tablet Commonly known as: ADALAT CC Take 1 tablet (60 mg total) by mouth 2 (two) times daily. What changed:  medication strength how much to take when to take this   PrePLUS 27-1 MG Tabs Take 1 tablet by mouth daily.         Discharge home in stable condition Infant Feeding: Breast Infant Disposition:NICU Discharge instruction: per After Visit Summary and Postpartum booklet. Activity: Advance as tolerated. Pelvic rest for  6 weeks.  Diet: carb modified diet and low salt diet Future Appointments: Future Appointments  Date Time Provider [place]  11/27/2020  1:30 PM Constant, Vickii Chafe, MD [facility] None  12/31/2020 10:15 AM Constant, Vickii Chafe, MD [facility] None   Follow up Visit:  Message sent on 10/30 by Dr Higinio Plan to [facility]:   Please schedule this patient for a In person postpartum visit in 4 weeks with the following provider: MD. Additional Postpartum F/U:2 hour GTT, Incision check 1 week, and BP check 1 week. I recommended follow up within four weeks with PCP for DM.  High risk pregnancy complicated by:  N2TF and  cHTN with SIPE + severe features  Delivery mode:  C-Section, Low Transverse  Anticipated Birth Control:  Nexplanon   11/20/2020 Radene Gunning, MD

## 2020-11-17 NOTE — Progress Notes (Signed)
Patient ID: Martha [name], female   DOB: 08/07/1985, 35 y.o.   MRN: [id number] FHR tracing persistently non-reactive today. BPP ordered  Adam Phenix, MD 4:39 PM

## 2020-11-17 NOTE — Progress Notes (Signed)
Patient ID: Joaquin Bend Soc, female   DOB: July 09, 1985, 35 y.o.   MRN: [id number] Fetal tracing still NR occasion deceleration BPP 6/8, 6/10, recommend delivery per Dr. Parke Poisson. As there are decelerations spontaneously I recommend cesarean section rather than IOL which would likely result in fetal intolerance  The risks of surgery were discussed with the patient including but were not limited to: bleeding which may require transfusion or reoperation; infection which may require antibiotics; injury to bowel, bladder, ureters or other surrounding organs; injury to the fetus; need for additional procedures including hysterectomy in the event of a life-threatening hemorrhage; formation of adhesions; placental abnormalities with subsequent pregnancies; incisional problems; thromboembolic phenomenon and other postoperative/anesthesia complications.  The patient concurred with the proposed plan, giving informed written consent for the procedure.   Patient has been NPO since 1300 she will remain NPO for procedure. Anesthesia and OR aware. Preoperative prophylactic antibiotics and SCDs ordered on call to the OR.  To OR when ready.   Adam Phenix, MD 11/17/2020

## 2020-11-17 NOTE — Anesthesia Postprocedure Evaluation (Signed)
Anesthesia Post Note  Patient: Martha [name] Soc  Procedure(s) Performed: CESAREAN SECTION     Patient location during evaluation: PACU Anesthesia Type: Spinal Level of consciousness: oriented and awake and alert Pain management: pain level controlled Vital Signs Assessment: post-procedure vital signs reviewed and stable Respiratory status: spontaneous breathing, respiratory function stable and patient connected to nasal cannula oxygen Cardiovascular status: blood pressure returned to baseline and stable Postop Assessment: no headache, no backache and no apparent nausea or vomiting Anesthetic complications: no   No notable events documented.  Last Vitals:  Vitals:   11/17/20 2339 11/17/20 2345  BP: 108/70 116/87  Pulse: 80 77  Resp: 14 18  Temp: (!) 36.4 C   SpO2:  98%    Last Pain:  Vitals:   11/17/20 2004  TempSrc: Oral  PainSc:    Pain Goal: Patients Stated Pain Goal: 2 (11/16/20 0845)              Epidural/Spinal Function Cutaneous sensation: No Sensation (11/17/20 2345), Patient able to flex knees: No (11/17/20 2345), Patient able to lift hips off bed: No (11/17/20 2345), Back pain beyond tenderness at insertion site: No (11/17/20 2345), Progressively worsening motor and/or sensory loss: No (11/17/20 2345), Bowel and/or bladder incontinence post epidural: No (11/17/20 2345)  Harbert Fitterer

## 2020-11-17 NOTE — Anesthesia Procedure Notes (Addendum)
Spinal  Patient location during procedure: OR Start time: 11/17/2020 10:10 PM End time: 11/17/2020 10:15 PM Reason for block: surgical anesthesia Staffing Anesthesiologist: Bethena Midget, MD Preanesthetic Checklist Completed: patient identified, IV checked, site marked, risks and benefits discussed, surgical consent, monitors and equipment checked, pre-op evaluation and timeout performed Spinal Block Patient position: sitting Prep: DuraPrep Patient monitoring: heart rate, cardiac monitor, continuous pulse ox and blood pressure Approach: midline Location: L3-4 Injection technique: single-shot Needle Needle type: Sprotte  Needle gauge: 24 G Needle length: 9 cm Assessment Sensory level: T4 Events: CSF return

## 2020-11-17 NOTE — Anesthesia Preprocedure Evaluation (Addendum)
Anesthesia Evaluation  Patient identified by MRN, date of birth, ID band Patient awake    Reviewed: Allergy & Precautions, H&P , NPO status , Patient's Chart, lab work & pertinent test results, reviewed documented beta blocker date and time   Airway Mallampati: II  TM Distance: >3 FB Neck ROM: full    Dental no notable dental hx. (+) Teeth Intact, Dental Advisory Given   Pulmonary neg pulmonary ROS,    Pulmonary exam normal breath sounds clear to auscultation       Cardiovascular hypertension, Pt. on medications Normal cardiovascular exam Rhythm:regular Rate:Normal     Neuro/Psych negative neurological ROS  negative psych ROS   GI/Hepatic negative GI ROS, Neg liver ROS,   Endo/Other  diabetes, Type obesity  Renal/GU Renal disease  negative genitourinary   Musculoskeletal   Abdominal   Peds  Hematology negative hematology ROS (+)   Anesthesia Other Findings   Reproductive/Obstetrics (+) Pregnancy                             Anesthesia Physical Anesthesia Plan  ASA: 3 and emergent  Anesthesia Plan: Spinal   Post-op Pain Management:    Induction:   PONV Risk Score and Plan: 2 and Treatment may vary due to age or medical condition  Airway Management Planned: Natural Airway  Additional Equipment: None  Intra-op Plan:   Post-operative Plan:   Informed Consent: I have reviewed the patients History and Physical, chart, labs and discussed the procedure including the risks, benefits and alternatives for the proposed anesthesia with the patient or authorized representative who has indicated his/her understanding and acceptance.       Plan Discussed with: Anesthesiologist and CRNA  Anesthesia Plan Comments:         Anesthesia Quick Evaluation

## 2020-11-17 NOTE — Progress Notes (Signed)
FACULTY PRACTICE ANTEPARTUM(COMPREHENSIVE) NOTE  Martha [name] Soc is a 35 y.o. [phone] with Estimated Date of Delivery: 01/06/21   By  early ultrasound [redacted]w[redacted]d  who is admitted for chronic HTN with superimposed preeclampsia with severe features.    **Spanish interpreter via video was used for the entirety of the visit  Fetal presentation is cephalic. Length of Stay:  16  Days  Date of admission:11/01/2020  Subjective: Pt notes mild headache this am- requesting tylenol.  Denies blurry or change in vision.  Notes feeling hot.  Of note, she feels like this happens every few days after she takes  Patient reports the fetal movement as active. Patient reports uterine contraction  activity as none.2 Patient reports  vaginal bleeding as none. Patient describes fluid per vagina as None.  Vitals:  Blood pressure 135/76, pulse 82, temperature 97.7 F (36.5 C), temperature source Oral, resp. rate 18, height 5\' 1"  (1.549 m), weight 90.7 kg, last menstrual period 04/01/2020, SpO2 99 %. Vitals:   11/16/20 2018 11/17/20 0016 11/17/20 0349 11/17/20 0756  BP: (!) 141/84 140/81 123/67 135/76  Pulse: 93 88 91 82  Resp: 17 18 17 18   Temp: 97.7 F (36.5 C) 98.1 F (36.7 C) 97.7 F (36.5 C) 97.7 F (36.5 C)  TempSrc: Oral Oral Oral Oral  SpO2: 100%  98% 99%  Weight:      Height:       Physical Examination:  General appearance - alert, well appearing, and in no distress Mental status - normal mood, behavior, speech, dress, motor activity, and thought processes Chest - CTAB Heart - normal rate and regular rhythm Abdomen - gravid, soft and non-tender Extremities - pedal edema 1 + Skin - warm and dry  Fetal Monitoring:  Baseline: 130 bpm, Variability: minimal, Accelerations: no accels, and Decelerations: occasional variable decels    nonreactive  Labs:  Results for orders placed or performed during the hospital encounter of 11/01/20 (from the past 24 hour(s))  Glucose, capillary   Collection  Time: 11/16/20 10:22 AM  Result Value Ref Range   Glucose-Capillary 106 (H) 70 - 99 mg/dL   Comment 1 Notify RN    Comment 2 Document in Chart   Glucose, capillary   Collection Time: 11/16/20  1:28 PM  Result Value Ref Range   Glucose-Capillary 125 (H) 70 - 99 mg/dL   Comment 1 Notify RN    Comment 2 Document in Chart   Glucose, capillary   Collection Time: 11/16/20  9:36 PM  Result Value Ref Range   Glucose-Capillary 275 (H) 70 - 99 mg/dL  Glucose, capillary   Collection Time: 11/17/20 12:20 AM  Result Value Ref Range   Glucose-Capillary 90 70 - 99 mg/dL  CBC with Differential/Platelet   Collection Time: 11/17/20  4:09 AM  Result Value Ref Range   WBC 8.5 4.0 - 10.5 K/uL   RBC 4.43 3.87 - 5.11 MIL/uL   Hemoglobin 13.6 12.0 - 15.0 g/dL   HCT 11/19/20 11/19/20 - 44.0 %   MCV 89.2 80.0 - 100.0 fL   MCH 30.7 26.0 - 34.0 pg   MCHC 34.4 30.0 - 36.0 g/dL   RDW 10.2 72.5 - 36.6 %   Platelets 209 150 - 400 K/uL   nRBC 0.0 0.0 - 0.2 %   Neutrophils Relative % 57 %   Neutro Abs 4.9 1.7 - 7.7 K/uL   Lymphocytes Relative 27 %   Lymphs Abs 2.3 0.7 - 4.0 K/uL   Monocytes Relative 9 %  Monocytes Absolute 0.8 0.1 - 1.0 K/uL   Eosinophils Relative 5 %   Eosinophils Absolute 0.4 0.0 - 0.5 K/uL   Basophils Relative 1 %   Basophils Absolute 0.1 0.0 - 0.1 K/uL   Immature Granulocytes 1 %   Abs Immature Granulocytes 0.06 0.00 - 0.07 K/uL  Comprehensive metabolic panel   Collection Time: 11/17/20  4:09 AM  Result Value Ref Range   Sodium 133 (L) 135 - 145 mmol/L   Potassium 4.4 3.5 - 5.1 mmol/L   Chloride 107 98 - 111 mmol/L   CO2 19 (L) 22 - 32 mmol/L   Glucose, Bld 95 70 - 99 mg/dL   BUN 18 6 - 20 mg/dL   Creatinine, Ser 1.61 0.44 - 1.00 mg/dL   Calcium 9.4 8.9 - 09.6 mg/dL   Total Protein 5.0 (L) 6.5 - 8.1 g/dL   Albumin 1.8 (L) 3.5 - 5.0 g/dL   AST 22 15 - 41 U/L   ALT 17 0 - 44 U/L   Alkaline Phosphatase 92 38 - 126 U/L   Total Bilirubin 0.6 0.3 - 1.2 mg/dL   GFR, Estimated >04  >54 mL/min   Anion gap 7 5 - 15  Glucose, capillary   Collection Time: 11/17/20  7:20 AM  Result Value Ref Range   Glucose-Capillary 78 70 - 99 mg/dL    Imaging Studies:    [place] MFM FETAL BPP WO NON STRESS  Result Date: 11/16/2020 ----------------------------------------------------------------------  OBSTETRICS REPORT                       (Signed Final 11/16/2020 02:23 pm) ---------------------------------------------------------------------- Patient Info  ID #:       [id number]                          D.O.B.:  03/24/1985 (35 yrs)  Name:       Martha [name] [facility]              Visit Date: 11/16/2020 01:09 pm ---------------------------------------------------------------------- Performed By  Attending:        Ma Rings MD         Secondary Phy.:   Lazaro Arms                                                             MD  Performed By:     Marcellina Millin          Address:          [address]                    RD[address]                                                             [place], [place]  [address]  Referred By:      [facility] MAU/Triage         [facility] ---------------------------------------------------------------------- Orders  #  Description                           Code        Ordered By  1  [place] MFM FETAL BPP WO NON               76819.01    LUTHER EURE     STRESS ----------------------------------------------------------------------  #  Order #                     Accession #                Episode #  1  [id number]                   [id number]                 [id number] ---------------------------------------------------------------------- Indications  Pre-eclampsia                                  O14.90  [redacted] weeks gestation of pregnancy                Z3A.32  Pre-existing diabetes, type 2, in pregnancy,   O24.113  third trimester  Obesity  complicating pregnancy, third          O91.213  trimester  Advanced maternal age multigravida 14+,        O51.523  third trimester ---------------------------------------------------------------------- Fetal Evaluation  Num Of Fetuses:         1  Fetal Heart Rate(bpm):  141  Cardiac Activity:       Observed  Presentation:           Cephalic  Placenta:               Posterior  Amniotic Fluid  AFI FV:      Within normal limits  AFI Sum(cm)     %Tile       Largest Pocket(cm)  9.7             15          4.5  RUQ(cm)       RLQ(cm)       LUQ(cm)        LLQ(cm)  4.5           1.1           2.1            2 ---------------------------------------------------------------------- Biophysical Evaluation  Amniotic F.V:   Pocket => 2 cm             F. Tone:        Observed  F. Movement:    Observed                   Score:  8/8  F. Breathing:   Observed ---------------------------------------------------------------------- OB History  Gravidity:    5         Term:   3        Prem:   1        SAB:   0  TOP:          0       Ectopic:  0        Living: 4 ---------------------------------------------------------------------- Gestational Age  LMP:           32w 5d        Date:  04/01/20                 EDD:   01/06/21  Best:          Armida Sans 5d     Det. By:  LMP  (04/01/20)          EDD:   01/06/21 ---------------------------------------------------------------------- Anatomy  Stomach:               Appears normal, left   Bladder:                Appears normal                         sided ---------------------------------------------------------------------- Comments  This patient has been hospitalized due to chronic  hypertension with superimposed preeclampsia.  Her  pregnancy has also been complicated by pregestational  diabetes.  A biophysical profile performed today was 8 out of 8.  There was normal amniotic fluid noted on today's ultrasound  exam. ----------------------------------------------------------------------                    Ma Rings, MD Electronically Signed Final Report   11/16/2020 02:23 pm ----------------------------------------------------------------------    Medications:  Scheduled  docusate sodium  100 mg Oral Daily   glyBURIDE  5 mg Oral QHS   insulin aspart  0-14 Units Subcutaneous TID PC   labetalol  800 mg Oral Q8H   metFORMIN  1,000 mg Oral BID WC   NIFEdipine  60 mg Oral BID   pantoprazole  40 mg Oral Daily   prenatal multivitamin  1 tablet Oral Q1200   sodium chloride flush  3 mL Intravenous Q12H   I have reviewed the patient's current medications.  ASSESSMENT: [id number] [redacted]w[redacted]d Estimated Date of Delivery: 01/06/21  Patient Active Problem List   Diagnosis Date Noted   Supervision of high risk pregnancy, antepartum 11/01/2020   Severe pre-eclampsia 11/01/2020   Chronic hypertension affecting pregnancy 10/11/2020   Positive for macroalbuminuria 03/22/2018   Diabetes mellitus type 2, uncontrolled, with complications 03/19/2018   Essential hypertension 03/19/2018   Obesity affecting pregnancy, antepartum 03/19/2018   Diabetes mellitus affecting pregnancy, antepartum 08/10/2013   Language barrier 08/10/2013   Obesity 08/10/2013   History of depression 08/10/2013    PLAN: 1) Preeclampsia with severe features -BP controlled with current medication- Procardia 60mg  bid, Labetalol 600 q 8hr -given tylenol this am, will follow clinical symptoms  2) Fetal well being -NICHD- Cat. 2 this am, will plan for continuous monitoring, care turned over to Dr. -suspect BPP to be ordered  3) GDMA2 -on metformin/glyburide -SSI as needed  4) Contraceptive management -pt wishes to consider all options including sterilization.  Reviewed risk/benefit of surgery including bleeding, infection and injury.  Questions and concerns were addressed. -notes pregnancy with IUD and Nexplanon.  She has also tried pills  and Depot in the past -Wishes husband would have vasectomy, but states that is  not an option for her -After much discussion, she has decided to hold off on tubal and consider Nexplanon   DISPO: Plan for IOL @ 34wks unless clinically indicated based on maternal or fetal indication.  Alessandra Bevels Sahiba Granholm 11/17/2020,8:55 AM

## 2020-11-17 NOTE — Op Note (Signed)
Martha [name] PROCEDURE DATE: 11/17/2020  PREOPERATIVE DIAGNOSES: Intrauterine pregnancy at [redacted]w[redacted]d weeks gestation; non-reassuring fetal status and severe preeclampsia  POSTOPERATIVE DIAGNOSES: The same  PROCEDURE: Primary Low Transverse Cesarean Section  SURGEON:  Dr. Scheryl Darter   ASSISTANT:  Dr. Leticia Penna   ANESTHESIOLOGY TEAM: Anesthesiologist: Bethena Midget, MD CRNA: Trellis Paganini, CRNA  INDICATIONS: Martha [name] [name] is a 35 y.o. [phone] at [redacted]w[redacted]d here for cesarean section secondary to the indications listed under preoperative diagnoses; please see preoperative note for further details.  The risks of surgery were discussed with the patient including but were not limited to: bleeding which may require transfusion or reoperation; infection which may require antibiotics; injury to bowel, bladder, ureters or other surrounding organs; injury to the fetus; need for additional procedures including hysterectomy in the event of a life-threatening hemorrhage; formation of adhesions; placental abnormalities wth subsequent pregnancies; incisional problems; thromboembolic phenomenon and other postoperative/anesthesia complications.  The patient concurred with the proposed plan, giving informed written consent for the procedure.    FINDINGS:  Viable female infant in cephalic presentation.  Apgars 7 and 9.  Clear amniotic fluid.  Intact placenta, three vessel cord.  Normal uterus, fallopian tubes and ovaries bilaterally.  ANESTHESIA: Spinal INTRAVENOUS FLUIDS: 2400 ml   ESTIMATED BLOOD LOSS: 338 ml URINE OUTPUT:  100 ml SPECIMENS: Placenta sent to pathology COMPLICATIONS: None immediate  PROCEDURE IN DETAIL:  The patient preoperatively received intravenous antibiotics and had sequential compression devices applied to her lower extremities.  She was then taken to the operating room where spinal anesthesia was administered and was found to be adequate. She was then placed in a dorsal  supine position with a leftward tilt, and prepped and draped in a sterile manner.  A foley catheter was placed into her bladder and attached to constant gravity.  After an adequate timeout was performed, a Pfannenstiel skin incision was made with scalpel and carried through to the underlying layer of fascia. The fascia was incised in the midline, and this incision was extended bilaterally using the Mayo scissors.  Kocher clamps were applied to the superior aspect of the fascial incision and the underlying rectus muscles were dissected off bluntly and sharply.  A similar process was carried out on the inferior aspect of the fascial incision. The rectus muscles were separated in the midline and the peritoneum was entered bluntly. Moderate amount of ascites with clear fluid was noted upon entering. The Alexis self-retaining retractor was introduced into the abdominal cavity.  Attention was turned to the lower uterine segment where a low transverse hysterotomy was made with a scalpel and extended bilaterally bluntly.    The infant was successfully delivered, the cord was clamped and cut after 30 seconds, and the infant was handed over to the awaiting neonatology team. Uterine massage was then administered, and the placenta delivered intact with a three-vessel cord. The uterus was then cleared of clots and debris.  The hysterotomy was closed with 0 Vicryl in a running locked fashion, and an imbricating layer was also placed with 0 Vicryl. A Figure-of-eight 0 Vicryl serosal stitch was placed to help with hemostasis.  The pelvis was cleared of all clot and debris. Hemostasis was confirmed on all surfaces.  The retractor was removed.  The peritoneum was closed with a 0 Vicryl running stitch. The fascia was then closed using 0 Vicryl in a running fashion.  The subcutaneous layer was irrigated, re-approximated with 2-0 plain gut continuous stitches, and the skin was  closed with a 4-0 Vicryl subcuticular stitch. 30 ml of  Marcaine 0.5% was injected through the incision prior to skin repair. The patient tolerated the procedure well. Sponge, instrument and needle counts were correct x 3.  She was taken to the recovery room in stable condition.    Leticia Penna, DO  OB Fellow  [facility] for [employer], [facility]

## 2020-11-17 NOTE — Transfer of Care (Signed)
Immediate Anesthesia Transfer of Care Note  Patient: Martha [name]  Procedure(s) Performed: CESAREAN SECTION  Patient Location: PACU  Anesthesia Type:Spinal  Level of Consciousness: awake, alert  and patient cooperative  Airway & Oxygen Therapy: Patient Spontanous Breathing  Post-op Assessment: Report given to RN and Post -op Vital signs reviewed and stable  Post vital signs: Reviewed and stable  Last Vitals:  Vitals Value Taken Time  BP 108/70 11/17/20 2339  Temp 36.4 C 11/17/20 2339  Pulse 78 11/17/20 2344  Resp 16 11/17/20 2344  SpO2 98 % 11/17/20 2344  Vitals shown include unvalidated device data.  Last Pain:  Vitals:   11/17/20 2004  TempSrc: Oral  PainSc:       Patients Stated Pain Goal: 2 (11/16/20 0845)  Complications: No notable events documented.

## 2020-11-18 ENCOUNTER — Encounter (HOSPITAL_COMMUNITY): Payer: Self-pay | Admitting: Obstetrics & Gynecology

## 2020-11-18 LAB — COMPREHENSIVE METABOLIC PANEL
ALT: 17 U/L (ref 0–44)
AST: 27 U/L (ref 15–41)
Albumin: 1.7 g/dL — ABNORMAL LOW (ref 3.5–5.0)
Alkaline Phosphatase: 79 U/L (ref 38–126)
Anion gap: 7 (ref 5–15)
BUN: 18 mg/dL (ref 6–20)
CO2: 19 mmol/L — ABNORMAL LOW (ref 22–32)
Calcium: 8.4 mg/dL — ABNORMAL LOW (ref 8.9–10.3)
Chloride: 107 mmol/L (ref 98–111)
Creatinine, Ser: 0.89 mg/dL (ref 0.44–1.00)
GFR, Estimated: >60 mL/min (ref >60)
Glucose, Bld: 174 mg/dL — ABNORMAL HIGH (ref 70–99)
Potassium: 5.1 mmol/L (ref 3.5–5.1)
Sodium: 133 mmol/L — ABNORMAL LOW (ref 135–145)
Total Bilirubin: 0.3 mg/dL (ref 0.3–1.2)
Total Protein: 4.6 g/dL — ABNORMAL LOW (ref 6.5–8.1)

## 2020-11-18 LAB — CBC
HCT: 37 % (ref 36.0–46.0)
Hemoglobin: 12.7 g/dL (ref 12.0–15.0)
MCH: 30.8 pg (ref 26.0–34.0)
MCHC: 34.3 g/dL (ref 30.0–36.0)
MCV: 89.6 fL (ref 80.0–100.0)
Platelets: 226 10*3/uL (ref 150–400)
RBC: 4.13 MIL/uL (ref 3.87–5.11)
RDW: 12.5 % (ref 11.5–15.5)
WBC: 16 10*3/uL — ABNORMAL HIGH (ref 4.0–10.5)
nRBC: 0 % (ref 0.0–0.2)

## 2020-11-18 LAB — GLUCOSE, CAPILLARY
Glucose-Capillary: 101 mg/dL — ABNORMAL HIGH (ref 70–99)
Glucose-Capillary: 111 mg/dL — ABNORMAL HIGH (ref 70–99)
Glucose-Capillary: 117 mg/dL — ABNORMAL HIGH (ref 70–99)
Glucose-Capillary: 124 mg/dL — ABNORMAL HIGH (ref 70–99)
Glucose-Capillary: 126 mg/dL — ABNORMAL HIGH (ref 70–99)
Glucose-Capillary: 199 mg/dL — ABNORMAL HIGH (ref 70–99)

## 2020-11-18 MED ORDER — LACTATED RINGERS IV BOLUS
500.0000 mL | Freq: Once | INTRAVENOUS | Status: AC
  Administered 2020-11-18: 500 mL via INTRAVENOUS

## 2020-11-18 MED ORDER — SCOPOLAMINE 1 MG/3DAYS TD PT72: 1.0000 | MEDICATED_PATCH | Freq: Once | TRANSDERMAL | Status: DC

## 2020-11-18 MED ORDER — INSULIN ASPART 100 UNIT/ML IJ SOLN
0.0000 [IU] | INTRAMUSCULAR | Status: DC
  Administered 2020-11-18: 3 [IU] via SUBCUTANEOUS

## 2020-11-18 MED ORDER — KETOROLAC TROMETHAMINE 30 MG/ML IJ SOLN
INTRAMUSCULAR | Status: AC
  Filled 2020-11-18: qty 1

## 2020-11-18 MED ORDER — WITCH HAZEL-GLYCERIN EX PADS
1.0000 "application " | MEDICATED_PAD | CUTANEOUS | Status: DC | PRN
  Administered 2020-11-19: 1 via TOPICAL

## 2020-11-18 MED ORDER — DEXTROSE 5 % IV SOLN
1.0000 ug/kg/h | INTRAVENOUS | Status: DC | PRN
  Filled 2020-11-18: qty 5

## 2020-11-18 MED ORDER — ACETAMINOPHEN 325 MG PO TABS: 650.0000 mg | ORAL_TABLET | ORAL | Status: DC | PRN

## 2020-11-18 MED ORDER — IBUPROFEN 600 MG PO TABS
600.0000 mg | ORAL_TABLET | Freq: Four times a day (QID) | ORAL | Status: DC
  Administered 2020-11-19 – 2020-11-20 (×6): 600 mg via ORAL
  Filled 2020-11-18 (×6): qty 1

## 2020-11-18 MED ORDER — SIMETHICONE 80 MG PO CHEW: 80.0000 mg | CHEWABLE_TABLET | ORAL | Status: DC | PRN

## 2020-11-18 MED ORDER — LACTATED RINGERS IV SOLN: INTRAVENOUS | Status: DC

## 2020-11-18 MED ORDER — FUROSEMIDE 40 MG PO TABS
20.0000 mg | ORAL_TABLET | Freq: Two times a day (BID) | ORAL | Status: DC
  Administered 2020-11-18 – 2020-11-20 (×5): 20 mg via ORAL
  Filled 2020-11-18 (×5): qty 1

## 2020-11-18 MED ORDER — NALBUPHINE HCL 10 MG/ML IJ SOLN
5.0000 mg | INTRAMUSCULAR | Status: DC | PRN
Start: 2020-11-18 — End: 2020-11-20

## 2020-11-18 MED ORDER — NALBUPHINE HCL 10 MG/ML IJ SOLN: 5.0000 mg | INTRAMUSCULAR | Status: DC | PRN

## 2020-11-18 MED ORDER — TETANUS-DIPHTH-ACELL PERTUSSIS 5-2.5-18.5 LF-MCG/0.5 IM SUSY
0.5000 mL | PREFILLED_SYRINGE | Freq: Once | INTRAMUSCULAR | Status: AC
  Administered 2020-11-20: 0.5 mL via INTRAMUSCULAR
  Filled 2020-11-18: qty 0.5

## 2020-11-18 MED ORDER — KETOROLAC TROMETHAMINE 30 MG/ML IJ SOLN: 30.0000 mg | Freq: Four times a day (QID) | INTRAMUSCULAR | Status: DC | PRN

## 2020-11-18 MED ORDER — LABETALOL HCL 200 MG PO TABS
400.0000 mg | ORAL_TABLET | Freq: Two times a day (BID) | ORAL | Status: DC
  Administered 2020-11-18 – 2020-11-20 (×5): 400 mg via ORAL
  Filled 2020-11-18 (×5): qty 2

## 2020-11-18 MED ORDER — PRENATAL MULTIVITAMIN CH
1.0000 | ORAL_TABLET | Freq: Every day | ORAL | Status: DC
  Administered 2020-11-18 – 2020-11-20 (×3): 1 via ORAL
  Filled 2020-11-18 (×3): qty 1

## 2020-11-18 MED ORDER — DIPHENHYDRAMINE HCL 50 MG/ML IJ SOLN: 12.5000 mg | INTRAMUSCULAR | Status: DC | PRN

## 2020-11-18 MED ORDER — MEDROXYPROGESTERONE ACETATE 150 MG/ML IM SUSP
150.0000 mg | INTRAMUSCULAR | Status: AC | PRN
  Administered 2020-11-20: 150 mg via INTRAMUSCULAR
  Filled 2020-11-18: qty 1

## 2020-11-18 MED ORDER — DIPHENHYDRAMINE HCL 25 MG PO CAPS: 25.0000 mg | ORAL_CAPSULE | ORAL | Status: DC | PRN

## 2020-11-18 MED ORDER — OXYCODONE HCL 5 MG PO TABS
5.0000 mg | ORAL_TABLET | ORAL | Status: DC | PRN
  Administered 2020-11-19: 5 mg via ORAL
  Filled 2020-11-18: qty 1

## 2020-11-18 MED ORDER — ACETAMINOPHEN 500 MG PO TABS
1000.0000 mg | ORAL_TABLET | Freq: Four times a day (QID) | ORAL | Status: DC
  Administered 2020-11-18 – 2020-11-20 (×10): 1000 mg via ORAL
  Filled 2020-11-18 (×10): qty 2

## 2020-11-18 MED ORDER — KETOROLAC TROMETHAMINE 30 MG/ML IJ SOLN
30.0000 mg | Freq: Four times a day (QID) | INTRAMUSCULAR | Status: AC
  Administered 2020-11-18 (×4): 30 mg via INTRAVENOUS
  Filled 2020-11-18 (×4): qty 1

## 2020-11-18 MED ORDER — SENNOSIDES-DOCUSATE SODIUM 8.6-50 MG PO TABS
2.0000 | ORAL_TABLET | Freq: Every day | ORAL | Status: DC
  Administered 2020-11-18 – 2020-11-20 (×3): 2 via ORAL
  Filled 2020-11-18 (×3): qty 2

## 2020-11-18 MED ORDER — NALBUPHINE HCL 10 MG/ML IJ SOLN: 5.0000 mg | Freq: Once | INTRAMUSCULAR | Status: DC | PRN

## 2020-11-18 MED ORDER — DIBUCAINE (PERIANAL) 1 % EX OINT: 1.0000 "application " | TOPICAL_OINTMENT | CUTANEOUS | Status: DC | PRN

## 2020-11-18 MED ORDER — COCONUT OIL OIL: 1.0000 "application " | TOPICAL_OIL | Status: DC | PRN

## 2020-11-18 MED ORDER — MEASLES, MUMPS & RUBELLA VAC IJ SOLR: 0.5000 mL | Freq: Once | INTRAMUSCULAR | Status: DC

## 2020-11-18 MED ORDER — ONDANSETRON HCL 4 MG/2ML IJ SOLN: 4.0000 mg | Freq: Three times a day (TID) | INTRAMUSCULAR | Status: DC | PRN

## 2020-11-18 MED ORDER — DIPHENHYDRAMINE HCL 25 MG PO CAPS: 25.0000 mg | ORAL_CAPSULE | Freq: Four times a day (QID) | ORAL | Status: DC | PRN

## 2020-11-18 MED ORDER — SIMETHICONE 80 MG PO CHEW
80.0000 mg | CHEWABLE_TABLET | Freq: Three times a day (TID) | ORAL | Status: DC
  Administered 2020-11-18 – 2020-11-20 (×8): 80 mg via ORAL
  Filled 2020-11-18 (×8): qty 1

## 2020-11-18 MED ORDER — NALOXONE HCL 0.4 MG/ML IJ SOLN: 0.4000 mg | INTRAMUSCULAR | Status: DC | PRN

## 2020-11-18 MED ORDER — SODIUM CHLORIDE 0.9% FLUSH: 3.0000 mL | INTRAVENOUS | Status: DC | PRN

## 2020-11-18 MED ORDER — FUROSEMIDE 10 MG/ML IJ SOLN
20.0000 mg | Freq: Once | INTRAMUSCULAR | Status: AC
  Administered 2020-11-18: 20 mg via INTRAVENOUS
  Filled 2020-11-18: qty 2

## 2020-11-18 MED ORDER — MAGNESIUM HYDROXIDE 400 MG/5ML PO SUSP
30.0000 mL | ORAL | Status: DC | PRN
  Administered 2020-11-19: 30 mL via ORAL
  Filled 2020-11-18: qty 30

## 2020-11-18 MED ORDER — ENOXAPARIN SODIUM 40 MG/0.4ML IJ SOSY
40.0000 mg | PREFILLED_SYRINGE | INTRAMUSCULAR | Status: DC
  Administered 2020-11-18 – 2020-11-19 (×2): 40 mg via SUBCUTANEOUS
  Filled 2020-11-18 (×2): qty 0.4

## 2020-11-18 MED ORDER — MENTHOL 3 MG MT LOZG: 1.0000 | LOZENGE | OROMUCOSAL | Status: DC | PRN

## 2020-11-18 MED ORDER — KETOROLAC TROMETHAMINE 30 MG/ML IJ SOLN
30.0000 mg | Freq: Four times a day (QID) | INTRAMUSCULAR | Status: DC | PRN
  Administered 2020-11-18: 30 mg via INTRAMUSCULAR

## 2020-11-18 NOTE — Progress Notes (Signed)
Subjective: Postpartum Day 1: Cesarean Delivery Patient reports incisional pain and tolerating PO.    Objective: Vital signs in last 24 hours: Temp:  [97.5 F (36.4 C)-98.4 F (36.9 C)] 98 F (36.7 C) (10/30 0410) Pulse Rate:  [68-89] 79 (10/30 0410) Resp:  [12-20] 16 (10/30 0410) BP: (95-194)/(41-136) 120/67 (10/30 0410) SpO2:  [95 %-99 %] 95 % (10/30 0410) Weight:  [91.3 kg] 91.3 kg (10/29 2010)  Physical Exam:  General: alert, cooperative, and no distress Lochia: appropriate Uterine Fundus: firm Incision: healing well, no significant drainage DVT Evaluation: No evidence of DVT seen on physical exam.  Recent Labs    11/17/20 2102 11/18/20 0512  HGB 14.0 12.7  HCT 40.8 37.0    Intake/Output Summary (Last 24 hours) at 11/18/2020 0733 Last data filed at 11/18/2020 0700 Gross per 24 hour  Intake 3140.61 ml  Output 543 ml  Net 2597.61 ml    Assessment/Plan: Status post Cesarean section. Postoperative course complicated by hypertesion, follow U/O   Labetalol 400 mg BID.  Scheryl Darter 11/18/2020, 7:32 AM

## 2020-11-18 NOTE — Lactation Note (Signed)
This note was copied from a baby's chart. Lactation Consultation Note  Patient Name: Martha [name] Soc [id number] Date: 11/18/2020 Reason for consult: Initial assessment;NICU baby;Preterm <34wks;Maternal endocrine disorder;Infant < 6lbs Age:35 hours  Visited with mom of 13 hours old pre-term NICU female, she's a P3 and already started pumping this morning. She doesn't have a pump at home, [facility] referral form was faxed, OB Specialty care RN Thayer Ohm also provided mom with a hand pump in addition to her hospital grade DEBP.  Reviewed pumping schedule, lactogenesis II, expectations, benefits of premature milk and supply/demand. This LC also assisted mom with filling up some forms, she's a [place] speaker.  Plan of care:  Encouraged mom to continue pumping consistently every 3 hours, at least 8 pumping sessions/24 hours Hand expression and breast massage were also encouraged prior pumping  Mom's first child (she's 20 y.o) present. All questions and concerns answered, family to call NICU LC PRN.  Maternal Data Has patient been taught Hand Expression?: Yes Does the patient have breastfeeding experience prior to this delivery?: Yes How long did the patient breastfeed?: 1st one for 4 years and 2nd for 2 years  Feeding Mother's Current Feeding Choice: Breast Milk  Lactation Tools Discussed/Used Tools: Pump Breast pump type: Double-Electric Breast Pump;Manual Pump Education: Setup, frequency, and cleaning;Milk Storage Reason for Pumping: pre-term in NICU Pumping frequency: q 3 hours (recommended) Pumped volume:  (drops)  Interventions Interventions: Breast feeding basics reviewed;DEBP;Hand pump;Education;"The NICU and Your Baby" book;LC Services brochure  Discharge Pump: DEBP;Manual WIC Program: Yes  Consult Status Consult Status: Follow-up Date: 11/18/20   Martha [name] 11/18/2020, 12:06 PM

## 2020-11-18 NOTE — Plan of Care (Signed)
  Problem: Education: Goal: Knowledge of condition will improve Outcome: Progressing   

## 2020-11-19 LAB — BASIC METABOLIC PANEL
Anion gap: 8 (ref 5–15)
BUN: 16 mg/dL (ref 6–20)
CO2: 23 mmol/L (ref 22–32)
Calcium: 8.8 mg/dL — ABNORMAL LOW (ref 8.9–10.3)
Chloride: 104 mmol/L (ref 98–111)
Creatinine, Ser: 0.68 mg/dL (ref 0.44–1.00)
GFR, Estimated: >60 mL/min (ref >60)
Glucose, Bld: 100 mg/dL — ABNORMAL HIGH (ref 70–99)
Potassium: 4.1 mmol/L (ref 3.5–5.1)
Sodium: 135 mmol/L (ref 135–145)

## 2020-11-19 LAB — GLUCOSE, CAPILLARY
Glucose-Capillary: 78 mg/dL (ref 70–99)
Glucose-Capillary: 106 mg/dL — ABNORMAL HIGH (ref 70–99)
Glucose-Capillary: 99 mg/dL (ref 70–99)
Glucose-Capillary: 153 mg/dL — ABNORMAL HIGH (ref 70–99)
Glucose-Capillary: 153 mg/dL — ABNORMAL HIGH (ref 70–99)

## 2020-11-19 MED ORDER — INSULIN ASPART 100 UNIT/ML IJ SOLN
0.0000 [IU] | Freq: Three times a day (TID) | INTRAMUSCULAR | Status: DC
  Administered 2020-11-19 – 2020-11-20 (×3): 3 [IU] via SUBCUTANEOUS

## 2020-11-19 NOTE — Lactation Note (Signed)
This note was copied from a baby's chart.  NICU Lactation Consultation Note  Patient Name: Girl Morgaine Kimball Soc [id number] Date: 11/19/2020 Age:35 years   Subjective Reason for consult: Follow-up assessment Mother continues to pump frequently and without difficulty. She is concerned that she is not seeing colostrum yet. We discussed normalcy at 35 hours pp.   Objective Infant data: Mother's Current Feeding Choice: Breast Milk    Maternal data: [id number]  C-Section, Low Transverse Does the patient have breastfeeding experience prior to this delivery?: Yes How long did the patient breastfeed?: 1st one for 4 years and 2nd for 2 years   Pumping frequency: q3 Pumped volume: 0 mL (drops)   WIC Program: Yes WIC Referral Sent?: Yes Pump: DEBP; Manual   Assessment Maternal: Milk volume: Normal   Intervention/Plan Interventions: Education; Breast feeding basics reviewed Video interpreter  Tools: Pump  Plan: Consult Status: Follow-up  NICU Follow-up type: Verify onset of copious milk; Verify absence of engorgement  Mother to continue pumping q3   Elder Negus 11/19/2020, 11:05 AM

## 2020-11-19 NOTE — Anesthesia Post-op Follow-up Note (Signed)
  Anesthesia Pain Follow-up Note  Patient: Martha [name] Soc  Day #: 2  Date of Follow-up: 11/19/2020 Time: 8:28 PM  Last Vitals:  Vitals:   11/19/20 1553 11/19/20 1950  BP: 116/78 135/76  Pulse: 74 88  Resp: 16 16  Temp: 36.5 C 37 C  SpO2: 99% 97%    Level of Consciousness: alert  Pain: none   Side Effects:left lower extremity numbness per patient  Catheter Site Exam:clean  Anti-Coag Meds (From admission, onward)   Start     Dose/Rate Route Frequency Ordered Stop   11/18/20 1600  enoxaparin (LOVENOX) injection 40 mg        40 mg Subcutaneous Every 24 hours 11/18/20 0157         Plan: Patient POD #2 s/p C/S under spinal anesthetic. Patient reporting left lower extremity numbness above the knee to the hip circumferentially without weakness. Patient ambulating without difficulty, asymptomatic otherwise with normal vitals. No bowel or bladder incontinence. Upon chart review, procedure unremarkable and case uneventful without complication. On exam, patient with normal range of motion and equal bilateral full strength capabilities to lower extremities. Patient reassured that her symptoms likely related to pregnancy related nerve involvement and not 2/2 to anesthesia given her presentation and absence of more ominous symptoms. She was instructed on more serious signs/symptoms and to return to the hospital of her clinical state worsened.   Earl Lites P Mattia Liford

## 2020-11-19 NOTE — Addendum Note (Signed)
Addendum  created 11/19/20 2039 by Atilano Median, DO   Clinical Note Signed

## 2020-11-19 NOTE — Progress Notes (Signed)
Daily Antepartum Note  Admission Date: 11/01/2020 Current Date: 11/19/2020 7:29 AM  Martha [name] Soc is a 35 y.o. [id number], POD#2 s/p pLTCS at 32/6 for non reassuring fetal status, admitted for severe pre-eclampsia superimposed on cHTN.  Pregnancy complicated by: Patient Active Problem List   Diagnosis Date Noted   Supervision of high risk pregnancy, antepartum 11/01/2020   Severe pre-eclampsia 11/01/2020   Chronic hypertension affecting pregnancy 10/11/2020   Positive for macroalbuminuria 03/22/2018   Diabetes mellitus type 2, uncontrolled, with complications 57/84/6962   Essential hypertension 03/19/2018   Obesity affecting pregnancy, antepartum 03/19/2018   Diabetes mellitus affecting pregnancy, antepartum 08/10/2013   Language barrier 08/10/2013   Obesity 08/10/2013   History of depression 08/10/2013    Overnight/24hr events:  SSI needed 2h post prandial x 1  Subjective:  No s/s of pre-eclampsia. +flatus and ambulation and no n/v and taking PO well. Some increased abdominal discomfort this morning. No BMs yet.   Objective:    Current Vital Signs 24h Vital Sign Ranges  T 98.4 F (36.9 C) Temp  Avg: 98.5 F (36.9 C)  Min: 98.3 F (36.8 C)  Max: 98.9 F (37.2 C)  BP 132/82 BP  Min: 129/73  Max: 145/83  HR 76 Pulse  Avg: 73  Min: 57  Max: 82  RR 16 Resp  Avg: 16.8  Min: 16  Max: 18  SaO2 93 % Room Air SpO2  Avg: 95.6 %  Min: 93 %  Max: 97 %       24 Hour I/O Current Shift I/O  Time Ins Outs 10/30 0701 - 10/31 0700 In: 1970.8 [P.O.:1200; I.V.:770.8] Out: 4050 [Urine:4050] No intake/output data recorded.   Patient Vitals for the past 24 hrs:  BP Temp Temp src Pulse Resp SpO2  11/19/20 0402 132/82 98.4 F (36.9 C) Oral 76 16 93 %  11/18/20 2330 -- -- -- -- -- 97 %  11/18/20 2329 (!) 141/74 98.6 F (37 C) Oral 75 16 --  11/18/20 2201 -- -- -- 79 -- --  11/18/20 2009 (!) 145/83 98.4 F (36.9 C) Oral 77 17 96 %  11/18/20 1800 137/79 98.3 F (36.8 C) Oral 82 18  96 %  11/18/20 1441 129/73 98.9 F (37.2 C) Oral (!) 57 18 --  11/18/20 0817 138/82 98.3 F (36.8 C) Oral 65 16 96 %    Physical exam: General: Well nourished, well developed female in no acute distress. Abdomen: gravid, obese, rare BS, mildly distended, nttp. C/d/I honeycomb dressing Cardiovascular: S1, S2 normal, no murmur, rub or gallop, regular rate and rhythm Respiratory: CTAB Extremities: no clubbing, cyanosis or edema Skin: Warm and dry.   Medications: Current Facility-Administered Medications  Medication Dose Route Frequency Provider Last Rate Last Admin   acetaminophen (TYLENOL) tablet 1,000 mg  1,000 mg Oral Q6H Beard, Samantha N, DO   1,000 mg at 11/19/20 0359   acetaminophen (TYLENOL) tablet 650 mg  650 mg Oral Q4H PRN Woodroe Mode, MD       coconut oil  1 application Topical PRN Patriciaann Clan, DO       witch hazel-glycerin (TUCKS) pad 1 application  1 application Topical PRN Patriciaann Clan, DO       And   dibucaine (NUPERCAINAL) 1 % rectal ointment 1 application  1 application Rectal PRN Patriciaann Clan, DO       diphenhydrAMINE (BENADRYL) injection 12.5 mg  12.5 mg Intravenous Q4H PRN Janeece Riggers, MD  Or   diphenhydrAMINE (BENADRYL) capsule 25 mg  25 mg Oral Q4H PRN Janeece Riggers, MD       diphenhydrAMINE (BENADRYL) capsule 25 mg  25 mg Oral Q6H PRN Higinio Plan, Samantha N, DO       enoxaparin (LOVENOX) injection 40 mg  40 mg Subcutaneous Q24H Beard, Samantha N, DO   40 mg at 11/18/20 1602   furosemide (LASIX) tablet 20 mg  20 mg Oral BID Darrelyn Hillock N, DO   20 mg at 11/18/20 1823   ibuprofen (ADVIL) tablet 600 mg  600 mg Oral Q6H Beard, Samantha N, DO   600 mg at 11/19/20 3212   insulin aspart (novoLOG) injection 0-11 Units  0-11 Units Subcutaneous Q4H Aletha Halim, MD   3 Units at 11/18/20 2046   labetalol (NORMODYNE) tablet 400 mg  400 mg Oral BID Woodroe Mode, MD   400 mg at 11/18/20 2201   magnesium hydroxide (MILK OF MAGNESIA) suspension  30 mL  30 mL Oral Q3 days PRN Patriciaann Clan, DO       measles, mumps & rubella vaccine (MMR) injection 0.5 mL  0.5 mL Subcutaneous Once Beard, Samantha N, DO       medroxyPROGESTERone (DEPO-PROVERA) injection 150 mg  150 mg Intramuscular Prior to discharge Beard, Samantha N, DO       menthol-cetylpyridinium (CEPACOL) lozenge 3 mg  1 lozenge Oral Q2H PRN Higinio Plan, Samantha N, DO       metFORMIN (GLUCOPHAGE) tablet 1,000 mg  1,000 mg Oral BID WC Beard, Samantha N, DO   1,000 mg at 11/18/20 1822   nalbuphine (NUBAIN) injection 5 mg  5 mg Intravenous Q4H PRN Janeece Riggers, MD       Or   nalbuphine (NUBAIN) injection 5 mg  5 mg Subcutaneous Q4H PRN Janeece Riggers, MD       nalbuphine (NUBAIN) injection 5 mg  5 mg Intravenous Once PRN Janeece Riggers, MD       Or   nalbuphine (NUBAIN) injection 5 mg  5 mg Subcutaneous Once PRN Janeece Riggers, MD       naloxone [facility]) injection 0.4 mg  0.4 mg Intravenous PRN Janeece Riggers, MD       And   sodium chloride flush (NS) 0.9 % injection 3 mL  3 mL Intravenous PRN Janeece Riggers, MD       naloxone HCl (NARCAN) 2 mg in dextrose 5 % 250 mL infusion  1-4 mcg/kg/hr Intravenous Continuous PRN Janeece Riggers, MD       NIFEdipine (PROCARDIA XL/NIFEDICAL XL) 24 hr tablet 60 mg  60 mg Oral BID Beard, Samantha N, DO   60 mg at 11/18/20 2201   ondansetron (ZOFRAN) injection 4 mg  4 mg Intravenous Q8H PRN Janeece Riggers, MD       oxyCODONE (Oxy IR/ROXICODONE) immediate release tablet 5-10 mg  5-10 mg Oral Q4H PRN Darrelyn Hillock N, DO       prenatal multivitamin tablet 1 tablet  1 tablet Oral Q1200 Beard, Samantha N, DO   1 tablet at 11/18/20 1308   scopolamine (TRANSDERM-SCOP) 1 MG/3DAYS 1.5 mg  1 patch Transdermal Once Janeece Riggers, MD       senna-docusate (Senokot-S) tablet 2 tablet  2 tablet Oral Daily Darrelyn Hillock N, DO   2 tablet at 11/18/20 2482   simethicone (MYLICON) chewable tablet 80 mg  80 mg Oral TID PC Beard, Samantha N, DO   80 mg at 11/18/20 1822    Tdap (BOOSTRIX) injection  0.5 mL  0.5 mL Intramuscular Once Patriciaann Clan, DO        Labs:  Recent Labs  Lab 11/17/20 0409 11/17/20 2102 11/18/20 0512  WBC 8.5 8.3 16.0*  HGB 13.6 14.0 12.7  HCT 39.5 40.8 37.0  PLT 209 227 226    Recent Labs  Lab 11/17/20 0409 11/18/20 0512 11/19/20 0423  NA 133* 133* 135  K 4.4 5.1 4.1  CL 107 107 104  CO2 19* 19* 23  BUN _0 CREATININE 0.68 0.89 0.68  CALCIUM 9.4 8.4* 8.8*  PROT 5.0* 4.6*  --   BILITOT 0.6 0.3  --   ALKPHOS 92 79  --   ALT 17 17  --   AST 22 27  --   GLUCOSE 95 174* 100*   Results for Martha, [name] (MRN [id number]) as of 11/19/2020 07:30  Ref. Range 11/17/2020 19:38 11/18/2020 00:21 11/18/2020 08:14 11/18/2020 10:41 11/18/2020 16:32 11/18/2020 20:09 11/18/2020 23:32 11/19/2020 04:03  Glucose-Capillary Latest Ref Range: 70 - 99 mg/dL 94 111 (H) 124 (H) 117 (H) 101 (H) 199 (H) 126 (H) 78    Radiology:  No new imaging  Assessment & Plan:  Pt stable *Postpartum: routine care. Encouraged PO pain med use while inpatient. Ask about birth control tomorrow. Trying to pump. O POS. Girl *DM2: CBGs improving. Continue to follow *cHTN: BPs improving. Continue to follow *PPx: lovenox *FEN/GI: regular diet *Dispo: likely tomorrow  Interpreter used  Durene Romans MD Attending Center for [place] (Faculty Practice) GYN Consult Phone: [phone] (M-F, [phone]) & [phone]  (Off hours, weekends, holidays)

## 2020-11-19 NOTE — Progress Notes (Signed)
Inpatient Diabetes Program Recommendations  AACE/ADA: New Consensus Statement on Inpatient Glycemic Control   Target Ranges:  Prepandial:   less than 140 mg/dL      Peak postprandial:   less than 180 mg/dL (1-2 hours)      Critically ill patients:  140 - 180 mg/dL   Results for [facility] (MRN [id number]) as of 11/19/2020 07:50  Ref. Range 11/18/2020 08:14 11/18/2020 10:41 11/18/2020 16:32 11/18/2020 20:09 11/18/2020 23:32 11/19/2020 04:03  Glucose-Capillary Latest Ref Range: 70 - 99 mg/dL 621 (H) 308 (H) 657 (H) 199 (H) 126 (H) 78    Review of Glycemic Control  Diabetes history: DM2 Outpatient Diabetes medications: NPH 20 units QM, NPH 10 units QHS Current orders for Inpatient glycemic control: Novolog 0-11 units Q4H, Metformin 1000 mg BID  Inpatient Diabetes Program Recommendations:    Insulin: Please consider changing CBGs to AC&HS and Novolog 0-11 units to AC&HS.  Outpatient DM: Would recommend to discharge patient on Metformin 1000 mg BID and have her follow up with PCP within a week from discharge regarding DM management.   Thanks, Orlando Penner, RN, MSN, CDE Diabetes Coordinator Inpatient Diabetes Program [phone] (Team Pager from 8am to 5pm)

## 2020-11-19 NOTE — Progress Notes (Signed)
This morning I spoke to patient with help of hospital interpreter, Bonnye Fava.  Patient shared that she was unable to have a bowel movement and that she felt a "ball" at her anus that was itchy.  On exam, pt noted to have a small hemorrhoid and was given witch hazel pads for comfort.  Patient also reported having "tingling" in left leg since her cesarean procedure.  She can walk safely, urinate with out difficulty, and denies incontinence.  Phoned Dr. Armond Hang, [facility] anesthesiologist with above information.  She plans to see the patient this afternoon.

## 2020-11-20 ENCOUNTER — Other Ambulatory Visit (HOSPITAL_COMMUNITY): Payer: Self-pay

## 2020-11-20 LAB — SURGICAL PATHOLOGY

## 2020-11-20 LAB — GLUCOSE, CAPILLARY
Glucose-Capillary: 195 mg/dL — ABNORMAL HIGH (ref 70–99)
Glucose-Capillary: 100 mg/dL — ABNORMAL HIGH (ref 70–99)

## 2020-11-20 MED ORDER — FUROSEMIDE 20 MG PO TABS
20.0000 mg | ORAL_TABLET | Freq: Two times a day (BID) | ORAL | 0 refills | Status: DC
  Filled 2020-11-20: qty 4, 2d supply, fill #0

## 2020-11-20 MED ORDER — LABETALOL HCL 200 MG PO TABS
400.0000 mg | ORAL_TABLET | Freq: Two times a day (BID) | ORAL | 0 refills | Status: DC
  Filled 2020-11-20: qty 120, 30d supply, fill #0

## 2020-11-20 MED ORDER — IBUPROFEN 600 MG PO TABS
600.0000 mg | ORAL_TABLET | Freq: Four times a day (QID) | ORAL | 0 refills | Status: DC
  Filled 2020-11-20: qty 30, 8d supply, fill #0

## 2020-11-20 MED ORDER — DIBUCAINE (PERIANAL) 1 % EX OINT
1.0000 "application " | TOPICAL_OINTMENT | CUTANEOUS | 0 refills | Status: DC | PRN
  Filled 2020-11-20: qty 28, fill #0

## 2020-11-20 MED ORDER — ACETAMINOPHEN 500 MG PO TABS
1000.0000 mg | ORAL_TABLET | Freq: Four times a day (QID) | ORAL | 0 refills | Status: DC
  Filled 2020-11-20: qty 30, 4d supply, fill #0

## 2020-11-20 MED ORDER — NIFEDIPINE ER 60 MG PO TB24
60.0000 mg | ORAL_TABLET | Freq: Two times a day (BID) | ORAL | 0 refills | Status: DC
  Filled 2020-11-20: qty 60, 30d supply, fill #0

## 2020-11-20 MED ORDER — METFORMIN HCL 1000 MG PO TABS
1000.0000 mg | ORAL_TABLET | Freq: Two times a day (BID) | ORAL | 0 refills | Status: DC
  Filled 2020-11-20: qty 60, 30d supply, fill #0

## 2020-11-20 NOTE — Lactation Note (Signed)
This note was copied from a baby's chart. Lactation Consultation Note  Patient Name: Martha [name] [id number]'[id number] Date: 11/20/2020 Reason for consult: Follow-up assessment;NICU baby;Preterm <34wks;Maternal discharge;Maternal endocrine disorder Age:35 hours  Visited with mom of 69 hours old pre-term NICU female, she's experiencing the onset of lactogenesis II. Breast are firm in some areas and soft in others, advised mom to continue doing breast massage prior and during pumping.  She's getting discharge today. Reviewed discharge education, engorgement prevention/treatment and sore nipples. She already contacted the [facility] office, she'll be calling them today to let them know she's getting d/c to pick up her pump.  Plan of care:   Encouraged mom to continue pumping consistently every 3 hours, at least 8 pumping sessions/24 hours. She's aware to switch to expression mode and use the bigger bottles instead of the 2 oz. Ones. Hand expression and breast massage were also encouraged prior pumping   Mom's first child (she's 20 y.o) present. All questions and concerns answered, family to call NICU LC PRN.  Maternal Data   Mom's milk is in and supply is WNL  Feeding Mother's Current Feeding Choice: Breast Milk and Donor Milk  Lactation Tools Discussed/Used Tools: Pump Breast pump type: Double-Electric Breast Pump;Manual Pump Education: Setup, frequency, and cleaning;Milk Storage Reason for Pumping: pre-term in NICU Pumping frequency: 6 times/24 hours Pumped volume: 70 mL  Interventions Interventions: Breast massage;Hand express;DEBP;Education;Infant Driven Feeding Algorithm education  Discharge Discharge Education: Engorgement and breast care Pump: DEBP;Manual  Consult Status Consult Status: Follow-up Date: 11/20/20   Martha [name] 11/20/2020, 11:58 AM

## 2020-11-20 NOTE — Care Management Note (Signed)
Case Management Note  Patient Details  Name: Martha [name] MRN: [id number] Date of Birth: 05-May-1985  Subjective/Objective:                   Preterm Pregnancy Delivered, Preeclampsia (severe), CHTN, and Type 2 DM Action/Plan: MATCH  Expected Discharge Date:  11/20/20                 In-House Referral:  CM; Social worker   Additional Comments: Received message from pharmacy that patient's medicines were around 100$. Patient has no insurance and CM verified with Nita M[employer] Financial counselor here at [facility]. MATCH placed in system for patient to get meds from [facility] pharmacy prior to discharge and they will be delivered to patient's room.   Geoffery Lyons, RN 11/20/2020, 9:28 AM

## 2020-11-21 ENCOUNTER — Other Ambulatory Visit: Payer: Self-pay

## 2020-11-21 ENCOUNTER — Ambulatory Visit: Payer: Self-pay

## 2020-11-21 NOTE — Lactation Note (Signed)
This note was copied from a baby's chart.  NICU Lactation Consultation Note  Patient Name: Martha [name] Soc [id number] Date: 11/21/2020 Age:35 days   Subjective Reason for consult: Follow-up assessment Mom's milk is in. She denies engorgement. Mom has apt to pick up [facility] pump tomorrow. She is currently using the symphony pump at the hospital and a hand pump at home.   Objective Infant data: Mother's Current Feeding Choice: Breast Milk   Maternal data: [id number]  C-Section, Low Transverse  Q3  Pump: DEBP; Manual   Assessment Infant: Maternal: Milk volume: Normal   Intervention/Plan Interventions: Education  Tools: Pump  Plan: Consult Status: Follow-up  NICU Follow-up type: -- (verify WIC pump pick up)    Elder Negus 11/21/2020, 12:39 PM

## 2020-11-22 ENCOUNTER — Ambulatory Visit: Payer: Self-pay

## 2020-11-22 NOTE — Lactation Note (Signed)
This note was copied from a baby's chart. Lactation Consultation Note  Patient Name: Martha [name] Soc TXMIW'O Date: 11/22/2020 Reason for consult: Follow-up assessment;Mother's request;NICU baby;Preterm <34wks Age:35 days  Interpretor: Verdon Cummins [id number]. Lactation followed up to assist [name] with lick and learn. We practiced hand expression and positioning at the breast. Baby showed some rooting and licking at the breast. We then discussed her progress with pumping. Ms. Ethelene Hal plans to pick up her [facility] pump at 1500 on Friday 11/4. I recommended that she use the Symphony pump while visiting and then use her manual at home and pump each breast 10 minutes (with the manual) and double pump both breasts with the Symphony for 15-20 minutes.  I recommended that she pump around baby's feeding schedule every three hours.   Maternal Data Has patient been taught Hand Expression?: Yes  Feeding Mother's Current Feeding Choice: Breast Milk   Interventions Interventions: Breast feeding basics reviewed;Assisted with latch;Hand express;Support pillows;Adjust position;Education  Discharge Pump: DEBP;Manual (Picking up WIC pump at 1500 on 11/4)  Consult Status Consult Status: Follow-up Date: 11/22/20 Follow-up type: In-patient    Walker Shadow 11/22/2020, 2:37 PM

## 2020-11-24 ENCOUNTER — Ambulatory Visit: Payer: Self-pay

## 2020-11-24 NOTE — Lactation Note (Signed)
This note was copied from a baby's chart. Lactation Consultation Note  Patient Name: Martha [name] Date: 11/24/2020 Reason for consult: Follow-up assessment;NICU baby;Preterm <34wks;Infant < 6lbs Age:35 days  Visited with mom of 53 25/2 weeks old (adjusted) NICU female, she's a P5 and experienced BF. Mom has taken baby to breast before but she's aware that she needs to do it to at the pumped/empty breast; baby was asleep when entered the room.  Mom picked up her pump from [facility] yesterday but she was told the hospital will provide the pump kit, issued a new DEBP for home use. Reviewed pumping schedule, lactogenesis III, IDF and expectations.  Plan of care:   Encouraged mom to continue pumping consistently every 3 hours, at least 8 pumping sessions/24 hours.  Hand expression and breast massage were also encouraged prior/during pumping   FOB present. All questions and concerns answered, family to call NICU LC PRN.  Maternal Data  Mom's supply continues to increase and is WNL  Feeding Mother's Current Feeding Choice: Breast Milk  Lactation Tools Discussed/Used Tools: Pump Breast pump type: Double-Electric Breast Pump;Manual Pump Education: Setup, frequency, and cleaning;Milk Storage Reason for Pumping: pre-term in NICU Pumping frequency: 6 times/24 hours Pumped volume: 180 mL (up to 240 ml)  Interventions Interventions: DEBP;Education;Breast feeding basics reviewed  Discharge Pump: DEBP;Manual  Consult Status Consult Status: Follow-up Date: 11/24/20 Follow-up type: In-patient   Martha [name] 11/24/2020, 12:57 PM

## 2020-11-27 ENCOUNTER — Ambulatory Visit (INDEPENDENT_AMBULATORY_CARE_PROVIDER_SITE_OTHER): Payer: Medicaid Other | Admitting: Obstetrics and Gynecology

## 2020-11-27 ENCOUNTER — Other Ambulatory Visit: Payer: Self-pay

## 2020-11-27 ENCOUNTER — Encounter: Payer: Self-pay | Admitting: Obstetrics and Gynecology

## 2020-11-27 VITALS — BP 128/86 | HR 112 | Wt 183.0 lb

## 2020-11-27 DIAGNOSIS — Z5189 Encounter for other specified aftercare: Secondary | ICD-10-CM | POA: Diagnosis not present

## 2020-11-27 MED ORDER — PHENYLEPHRINE-MINERAL OIL-PET 0.25-14-74.9 % RE OINT: 1.0000 "application " | TOPICAL_OINTMENT | Freq: Two times a day (BID) | RECTAL | 1 refills | Status: DC | PRN

## 2020-11-27 NOTE — Progress Notes (Signed)
1 week PP presents for Incision check and BP check.  Patient delivered at 32 weeks.

## 2020-11-27 NOTE — Progress Notes (Signed)
35 yo P5 POD#6 from C-section with CHTN and superimposed pre-eclampsia. Patient reports feeling well. Infant is still in NICU. Patient reports incisional pain well managed with pain medication. Patient is here for BP and incision check. Patient is without any complaints  Past Medical History:  Diagnosis Date   Diabetes mellitus type 2, uncontrolled, with complications 03/19/2018   Essential hypertension 03/19/2018   Kidney infection 0105/2017   Psh Family History  Problem Relation Age of Onset   Diabetes Mother    Hypertension Mother    Heart disease Father    Mental retardation Sister    Social History   Tobacco Use   Smoking status: Never   Smokeless tobacco: Never  Vaping Use   Vaping Use: Never used  Substance Use Topics   Alcohol use: No   Drug use: No   ROS See pertinent in HPI. All other systems reviewed and non contributory Blood pressure 128/86, pulse (!) 112, weight 183 lb (83 kg), currently breastfeeding. GENERAL: Well-developed, well-nourished female in no acute distress.  ABDOMEN: Soft, nontender, nondistended. No organomegaly. INCISION: honeycomb dressing remains in place and was remove. Steri strips in place. Patient asked to remove them in the shower. No erythema, induration or drainage EXTREMITIES: No cyanosis, clubbing, or edema, 2+ distal pulses.  A/P 35 POD#6 here for BP and incision check - Continue procardia for BP control - Wound care instructions provided - follow up as scheduled on 12/12 for post partum visit

## 2020-11-28 ENCOUNTER — Ambulatory Visit: Payer: Self-pay

## 2020-11-28 NOTE — Lactation Note (Signed)
This note was copied from a baby's chart. Lactation Consultation Note  Patient Name: Martha [name] Soc [id number]'E Date: 11/28/2020 Reason for consult: Follow-up assessment;Maternal endocrine disorder;NICU baby;Late-preterm 34-36.6wks;Infant < 6lbs Age:35 years  Visited with mom of 35 days old LPI NICU female, she reports a drop in her supply, mom believes is due to dehydration, she's only drinking 24 oz. of water/day.   Reviewed some strategies to increase her supply, power pumping, galactagogues and most importantly making sure she's fully emptying the breast at every pumping session, sometimes mom pumps for just 15 minutes/time.  Maternal Data  Mom's supply has dwindled, but it's still WNL  Feeding Mother's Current Feeding Choice: Breast Milk  Lactation Tools Discussed/Used Tools: Pump Breast pump type: Double-Electric Breast Pump Pump Education: Setup, frequency, and cleaning;Milk Storage Reason for Pumping: LPI in NICU Pumping frequency: 7-8 times/24 hours Pumped volume: 90 mL  Interventions Interventions: Breast feeding basics reviewed;DEBP;Education, Reviewed IDF 1  Plan of care:   Encouraged mom to continue pumping consistently until fully emptying both breasts every 3 hours, at least 8 pumping sessions/24 hours.  She'll call for assistance when she's ready to take baby to breast, she's aware she needs to pump prior feedings/attempts at the breast   No other support person at this time. All questions and concerns answered, mom to call NICU LC PRN.  Consult Status Consult Status: Follow-up Date: 11/28/20 Follow-up type: In-patient   Jaskirat Zertuche [name] 11/28/2020, 10:29 AM

## 2020-11-30 ENCOUNTER — Telehealth (HOSPITAL_COMMUNITY): Payer: Self-pay | Admitting: *Deleted

## 2020-11-30 NOTE — Telephone Encounter (Addendum)
Mom reports feeling well. Incision healing without difficulty. Had PP appt earlier this week. EPDS=8 (hospital score=13) Discussed checking in with provider if anxiety or sadness persists or becomes worse. Mom agrees. Reports no concerns with herself at this time. Baby is still in NICU  Duffy Rhody, RN 11-30-2020 at 10:30am

## 2020-12-04 ENCOUNTER — Ambulatory Visit: Payer: Self-pay

## 2020-12-04 NOTE — Lactation Note (Signed)
This note was copied from a baby's chart. Lactation Consultation Note  Patient Name: Girl Travis Purk Soc JTTSV'X Date: 12/04/2020 Reason for consult: Follow-up assessment;NICU baby;Breastfeeding assistance;Infant < 6lbs;Late-preterm 34-36.6wks Age:35 wk.o.  Visited with mom of 87 63/42 weeks old (adjusted) NICU female, mom has been taking baby to breast and requested a feeding assist.  Baby kept slipping off the bare breast, LC put a NS # 24 on mom which is the right fit for her but too big for baby. Baby would stay latch and would only do a few sucks, then let go. Asked mom permission if we can try with a smaller NS the next time if it's not too painful for her; she agreed to try it at least once.  Mom kept leaking during this feeding assist; even though she told LC she has already pumped. When [facility] asked more questions mom said she had pumped + 3 hours ago. Educated mom to pump 1/2 hour before she puts baby to breast.   LC assisted mom with washing her pump parts and changing baby's outfit. Noticed a couple of baby bottles ([employer] and the other one was off brand) on the bin where she had baby's clothes and she voiced she's tried to bottle feed baby some breast milk on 11/30/20 and 12/02/20.  Educated mom on how we need to protect baby's airways and that SLPs will be the ones who make the call when baby is ready to start bottle feeding. Spent a good amount of time providing education to this family regarding pumping prior feedings and having oral volumes on a baby that is not ready for PO feedings yet. Mom voiced understanding and said she'll wait for SLPs to come and do their evaluation.   Maternal Data  Mom's supply is WNL  Feeding Mother's Current Feeding Choice: Breast Milk  LATCH Score Latch: Repeated attempts needed to sustain latch, nipple held in mouth throughout feeding, stimulation needed to elicit sucking reflex. (baby only did a few sucks with NS # 24)  Audible Swallowing:  None  Type of Nipple: Everted at rest and after stimulation (right, left one is inverted)  Comfort (Breast/Nipple): Soft / non-tender  Hold (Positioning): Assistance needed to correctly position infant at breast and maintain latch.  LATCH Score: 6  Lactation Tools Discussed/Used Tools: Pump;Flanges;Nipple Shields Nipple shield size: 24 Flange Size: 24 Breast pump type: Double-Electric Breast Pump Pump Education: Setup, frequency, and cleaning;Milk Storage Reason for Pumping: LPI in NICU Pumping frequency: 6 times/24 hours Pumped volume: 120 mL (120-150 ml)  Interventions Interventions: Breast feeding basics reviewed;DEBP;Education;Infant Driven Feeding Algorithm education;Assisted with latch;Adjust position;Support pillows;Pre-pump if needed  Plan of care:   Encouraged mom to continue pumping consistently until fully emptying both breasts every 3 hours, at least 8 pumping sessions/24 hours.  She'll continue taking baby to an empty/pump breast 1/2 hour before feeding attempt. Will use NS # 24 PRN, but will try NS # 20 on the next feeding assist on 12/07/20.   20 y.o baby's big sister present. All questions and concerns answered, family to call NICU LC PRN.  Discharge Pump: DEBP;Manual  Consult Status Consult Status: Follow-up Date: 12/04/20 Follow-up type: In-patient   Burhan Barham [name] 12/04/2020, 12:19 PM

## 2020-12-13 ENCOUNTER — Encounter (HOSPITAL_COMMUNITY): Payer: Self-pay | Admitting: Obstetrics & Gynecology

## 2020-12-13 ENCOUNTER — Inpatient Hospital Stay (HOSPITAL_COMMUNITY)
Admission: AD | Admit: 2020-12-13 | Discharge: 2020-12-13 | Disposition: A | Discharge status: Home / Self Care | Payer: Medicaid Other | Attending: Obstetrics & Gynecology | Admitting: Obstetrics & Gynecology

## 2020-12-13 ENCOUNTER — Other Ambulatory Visit: Payer: Self-pay

## 2020-12-13 ENCOUNTER — Ambulatory Visit: Payer: Self-pay

## 2020-12-13 DIAGNOSIS — E669 Obesity, unspecified: Secondary | ICD-10-CM | POA: Insufficient documentation

## 2020-12-13 DIAGNOSIS — N858 Other specified noninflammatory disorders of uterus: Secondary | ICD-10-CM | POA: Insufficient documentation

## 2020-12-13 DIAGNOSIS — O9089 Other complications of the puerperium, not elsewhere classified: Secondary | ICD-10-CM | POA: Insufficient documentation

## 2020-12-13 DIAGNOSIS — Z4889 Encounter for other specified surgical aftercare: Secondary | ICD-10-CM | POA: Diagnosis not present

## 2020-12-13 NOTE — MAU Note (Signed)
Presents for incision check.  States approx 2 weeks ago she sneezed hard and has had pain @ incision since then.  Also reports incision is draining foul smelling discharge.  S/P 11/17/2020.

## 2020-12-13 NOTE — Lactation Note (Signed)
This note was copied from a baby's chart. Lactation Consultation Note  Patient Name: Martha [name] Soc [id number] Date: 12/13/2020 Reason for consult: Follow-up assessment;NICU baby;Infant < 6lbs;Late-preterm 34-36.6wks;Maternal endocrine disorder Age:35 wk.o.  Visited with mom of 81 37/11 weeks old (adjusted) NICU female, she reports having some pain in her left nipple 4 days ago but now it's gone. LC noticed that her left nipple is inverted and it had a very small (about 1 mm) blister on top of it, but blister was almost flat and gone, only thing noticeable from it was a slightly darker color on the edges.  Mom also reported pain on her incision site from her C/S on the last week; advised her to visit MAU since today is a holiday. She reports pumping is going well; she's been taking baby to breast almost daily and would like another feeding assist for this weekend. Reviewed prevention/treatment for sore nipples, pumping schedule, supply/demand, feeding cues and IDF.  Maternal Data  Mom's supply is WNL  Feeding Mother's Current Feeding Choice: Breast Milk  Lactation Tools Discussed/Used Tools: Pump;Flanges Flange Size: 24 Breast pump type: Double-Electric Breast Pump Pump Education: Setup, frequency, and cleaning Reason for Pumping: LPI in NICU Pumping frequency: 7-8 times/24 hours Pumped volume: 120 mL  Interventions Interventions: Breast feeding basics reviewed;Hand express;Education  Plan of care   Encouraged mom to continue pumping consistently until fully emptying both breasts every 3 hours, at least 8 pumping sessions/24 hours.  She'll continue taking baby to an empty/pump breast 1/2 hour before feeding attempt. Will use NS # 24 PRN, but will try NS # 20 on the next feeding assist on 12/15/20.   20 y.o baby's big sister and FOB present. All questions and concerns answered, family to call NICU LC PRN.   Discharge Pump: DEBP  Consult Status Consult Status: Follow-up Date:  12/13/20 Follow-up type: In-patient   Martha [name] 12/13/2020, 12:11 PM

## 2020-12-13 NOTE — MAU Provider Note (Signed)
Event Date/Time   First Provider Initiated Contact with Patient 12/13/20 1305      S [name] Soc is a 35 y.o. [id number] patient who presents to MAU today reporting burning and occ pain at C/S incision site since she sneezed 2 wks ago and a foul-smelling discharge from the incision. She has been putting MecuroChrome on the incision; purchased from [place]. She was in the NICU visiting her baby and working with Advertising copywriter when she mentioned these sx's. The LC advised her to come to MAU for evaluation. Her C/S was on 11/17/2020 for non-reassuring FHR and severe PEC.  O BP 112/79 (BP Location: Right Arm)   Pulse 88   Temp 98.4 F (36.9 C) (Oral)   Resp 20   SpO2 98%  Physical Exam Constitutional:      Appearance: Normal appearance. She is obese.  Cardiovascular:     Rate and Rhythm: Normal rate.  Pulmonary:     Effort: Pulmonary effort is normal.  Abdominal:     Palpations: Abdomen is soft.  Skin:    General: Skin is warm and dry.     Comments: Discoloration around incision site from MecuroChrome used by patient, no s/x of cellulitis, no edema or discharge with firm palpation. No pain with palpation.  Neurological:     Mental Status: She is alert and oriented to person, place, and time.  Psychiatric:        Mood and Affect: Mood normal.        Behavior: Behavior normal.        Thought Content: Thought content normal.        Judgment: Judgment normal.    A Medical screening exam complete Encounter for post surgical wound check - Plan: Discharge patient   P Discharge from MAU in stable condition Keep scheduled postpartum appointment with [facility] on 12/31/20 Warning signs for worsening condition that would warrant emergency follow-up discussed Patient may return to MAU as needed   [name], CNM 12/13/2020 1:12 PM

## 2020-12-13 NOTE — Progress Notes (Signed)
Carloyn Jaeger, CNM into see pt and evaluate incision.  CNM states incision WNL, no drainage or odor.

## 2020-12-15 ENCOUNTER — Ambulatory Visit: Payer: Self-pay

## 2020-12-15 NOTE — Lactation Note (Signed)
This note was copied from a baby's chart. Lactation Consultation Note  Patient Name: Martha [name] Soc [id number] Date: 12/15/2020 Reason for consult: Follow-up assessment;Breastfeeding assistance;NICU baby;Maternal endocrine disorder;Late-preterm 34-36.6wks Age:35 wk.o.  Visited with mom of 70 3/68 weeks old (adjusted) NICU female, she requested a feeding assist. Baby still asleep on her bassinet when entered the room but she arose when mom changed her diaper.  LC took baby in cross cradle position to mother's right breast (where she had the everted nipple, the other one is inverted) baby able to latch without a NS but she kept slipping off the breast. LC tried a NS # 24 and baby was able to sustain the last for 3-4 minutes, she had a few short bursts of nutritive sucking with audible swallows noted.  This feeding assist had to be interrupted when baby started desaturating, mom voiced that she pumped at 8:30 am (2.5 hours ago), pool of EBM on NS after this feeding. Explained again to mom that baby is not ready to get full volumes and that she needs to pump right before every feeding assist.   This LC explained the difference between IDF 1 and IDF 2, mom is also working with SLP Byrd Hesselbach to start bottle feedings. Mom holding baby while the gavage was running when exiting the room; she voiced understanding to teaching.  Maternal Data  Mom's supply is WNL  Feeding Mother's Current Feeding Choice: Breast Milk  LATCH Score Latch: Repeated attempts needed to sustain latch, nipple held in mouth throughout feeding, stimulation needed to elicit sucking reflex. (with NS # 24)  Audible Swallowing: A few with stimulation  Type of Nipple: Everted at rest and after stimulation (right breast, left one is inverted)  Comfort (Breast/Nipple): Soft / non-tender  Hold (Positioning): Assistance needed to correctly position infant at breast and maintain latch.  LATCH Score: 7  Lactation Tools  Discussed/Used Tools: Pump;Nipple Dorris Carnes;Flanges Nipple shield size: 24 Flange Size: 24 Breast pump type: Double-Electric Breast Pump Pump Education: Setup, frequency, and cleaning;Milk Storage Reason for Pumping: LPI in NICU Pumping frequency: 7-8 times/24 hours Pumped volume: 120 mL  Interventions Interventions: Assisted with latch;Breast feeding basics reviewed;Hand express;Breast compression;DEBP;Education;Infant Driven Feeding Algorithm education  Plan of care   Encouraged mom to continue pumping consistently until fully emptying both breasts every 3 hours, at least 8 pumping sessions/24 hours.  She'll continue taking baby to an empty/pump breast 1/2 hour before feeding attempt. Will use NS # 24 PRN, mom prefers to put baby on to the bare breast   No other support person at this time. All questions and concerns answered, family to call NICU LC PRN.  Discharge Pump: DEBP  Consult Status Consult Status: Follow-up Date: 12/15/20 Follow-up type: In-patient   Ivyrose Hashman [name] 12/15/2020, 11:33 AM

## 2020-12-22 ENCOUNTER — Ambulatory Visit: Payer: Self-pay

## 2020-12-22 NOTE — Lactation Note (Signed)
This note was copied from a baby's chart. Lactation Consultation Note  Patient Name: Martha [name] Soc [id number] Date: 12/22/2020 Reason for consult: Follow-up assessment;NICU baby;Maternal endocrine disorder;Early term 22-38.6wks;Infant < 6lbs Age:35 wk.o.  Visited with mom of 35 18/30 weeks old (adjusted) NICU female, she reported that baby continues going to breast but without the NS. She is now using the gold nipple and reported she took 34 ml out of her 48 ml scheduled feeding.  Asked mom to call for assistance the next time she's ready to take baby to breast. Explained to mom that in the past baby wasn't quite ready to take volumes, but now that she's on a bottle she is. Mom understands that lactation would like to assess transfer at the breast during IDF 2.  Reviewed the differences between IDF 1 and 2, pumping schedule, feeding cues and ETI behavior.  Maternal Data  Mom's supply is still WNL but due to decreased pumping she's at risk of low supply. This has been discussed with mom multiple times in the past.  Feeding Mother's Current Feeding Choice: Breast Milk Nipple Type: Nfant Extra Slow Flow (gold)  Lactation Tools Discussed/Used Tools: Pump Breast pump type: Double-Electric Breast Pump Pump Education: Setup, frequency, and cleaning;Milk Storage Reason for Pumping: ETI in NICU Pumping frequency: 5 times/24 hours Pumped volume: 120 mL (120-180 ml)  Interventions Interventions: Breast feeding basics reviewed;DEBP;Education;Infant Driven Feeding Algorithm education  Plan of care   Encouraged mom to continue pumping consistently until fully emptying both breasts every 3 hours, at least 8 pumping sessions/24 hours.  She'll continue taking baby to breast on feeding cues. Will use NS # 24 PRN, mom prefers to put baby on to the bare breast   FOB present but asleep. All questions and concerns answered, family to call NICU LC PRN.  Consult Status Consult Status:  Follow-up Date: 12/22/20 Follow-up type: In-patient   Martha [name] 12/22/2020, 9:15 AM

## 2020-12-22 NOTE — Lactation Note (Signed)
This note was copied from a baby's chart. Lactation Consultation Note  Patient Name: Martha [name] Soc [id number] Date: 12/22/2020 Reason for consult: Follow-up assessment;NICU baby;Maternal endocrine disorder;Breastfeeding assistance;Early term 38-38.6wks;Infant < 6lbs Age:35 wk.o.  NICU RN Aurther Loft called out for a feeding assist at 11 am. Baby latched easily with both, with and without the NS # 20, but kept popping off the breast; mom told LC she was "going to the bathroom" and requested to change her diaper.  Mom was told not to pump prior feedings but she did. Baby latched for about 10 minutes total but only half of them were nutritive sucking. Swallows heard only when LC was doing compressions at the breast due to breast being empty.  However when baby had first let down she did a good job and pacing herself and taking breaks but after the milk was gone from NS # 20 she'd stop sucking. Remind mom to take baby to a full or half/full breast the next time.  Feeding Mother's Current Feeding Choice: Breast Milk Nipple Type: Nfant Extra Slow Flow (gold)  LATCH Score Latch: Repeated attempts needed to sustain latch, nipple held in mouth throughout feeding, stimulation needed to elicit sucking reflex. (baby would stop sucking after mom had a letwdown. Will only suck with stimulation, mom pumped 40 minutes prior this feeding). Used NS # 20 that is a much better fit than NS # 24  Audible Swallowing: A few with stimulation   Type of Nipple: Everted at rest and after stimulation  Comfort (Breast/Nipple): Soft / non-tender  Hold (Positioning): Assistance needed to correctly position infant at breast and maintain latch.  LATCH Score: 7  Lactation Tools Discussed/Used Tools: Pump Breast pump type: Double-Electric Breast Pump Pump Education: Setup, frequency, and cleaning;Milk Storage Reason for Pumping: ETI in NICU Pumping frequency: 5 times/24 hours Pumped volume: 120 mL (120-180  ml)  Interventions Interventions: Breast feeding basics reviewed;Skin to skin;Breast massage;Hand express;Breast compression;Education;Support pillows;Adjust position  Plan of care   Encouraged mom to continue pumping consistently until fully emptying both breasts every 3 hours, at least 8 pumping sessions/24 hours.  She'll continue taking baby to breast on feeding cues. Will use NS # 20 PRN, although mom prefers to put baby on to the bare breast   FOB present but asleep. All questions and concerns answered, family to call NICU LC PRN.  Consult Status Consult Status: Follow-up Date: 12/22/20 Follow-up type: In-patient   Martha [name] [name] 12/22/2020, 11:30 AM

## 2020-12-27 ENCOUNTER — Ambulatory Visit: Payer: Self-pay

## 2020-12-27 NOTE — Lactation Note (Signed)
This note was copied from a baby's chart. Lactation Consultation Note  Patient Name: Martha [name] Soc [id number] Date: 12/27/2020 Reason for consult:  (SLP request) Age:35 wk.o.  Lactation reported to Martha [name]' room to observe and assist with breast feeding. Baby Martha [name] has been moved to ad lib feeding status. I used an Engineer, technical sales via tabled: [name].  Upon entry, Martha [name] had just fed baby on the right breast. Baby was alert and hiccupping. She appeared to still be cueing to feed. When she stopped hiccupping, we attempted to latch her to the left breast. Baby had difficulty grasping the nipple on this side. I placed a size 20 NS on this side. Baby latched briefly with a few suckles and then would stop, turn her head, and let go. We would latch her again, and I noted that she appeared to become more agitated with the NS with increased WOB. We removed the shield and discontinued this attempt and soothed her with a pacifier.  When baby cued again, Martha [name] attempted to latch to the right breast. It took several minutes for baby to latch, and her latch appeared to be shallow and non-nutritive. However, baby had fed prior to my entry, and I could not assess the full feeding.  We discussed maternal milk supply. Martha [name] states that she can pump about 4 ounces in a session. Her leading milk producer is her left breast. I indicated that baby would likely need to transfer at least 45 mls from the right breast, and if she cannot, then we will need to find an alternate way of supplementing her with breast feeding. I recommended that Martha [name] continue to pump the left breast to maintain and support her milk volume on that side.  I put in a request for lactation to follow up on 12/9.  Maternal Data Has patient been taught Hand Expression?: Yes Does the patient have breastfeeding experience prior to this delivery?: Yes  Feeding Mother's Current Feeding Choice: Breast Milk  LATCH Score Latch:  Repeated attempts needed to sustain latch, nipple held in mouth throughout feeding, stimulation needed to elicit sucking reflex.  Audible Swallowing: None  Type of Nipple: Everted at rest and after stimulation  Comfort (Breast/Nipple): Soft / non-tender  Hold (Positioning): No assistance needed to correctly position infant at breast.  LATCH Score: 7   Lactation Tools Discussed/Used Breast pump type: Double-Electric Breast Pump Pump Education: Setup, frequency, and cleaning Reason for Pumping: NICU; support milk production Pumping frequency: rec pumping left breast even when ad lib today Pumped volume: 120 mL (120-180)  Interventions Interventions: Breast feeding basics reviewed;Skin to skin;Education;Assisted with latch;Hand express  Discharge Pump: DEBP  Consult Status Consult Status: Follow-up Date: 12/27/20 Follow-up type: In-patient    [name] 12/27/2020, 2:14 PM

## 2020-12-28 ENCOUNTER — Ambulatory Visit: Payer: Self-pay

## 2020-12-28 ENCOUNTER — Other Ambulatory Visit: Payer: Self-pay

## 2020-12-28 ENCOUNTER — Inpatient Hospital Stay (HOSPITAL_COMMUNITY)
Admission: AD | Admit: 2020-12-28 | Discharge: 2020-12-28 | Disposition: A | Discharge status: Home / Self Care | Payer: Medicaid Other | Attending: Family Medicine | Admitting: Family Medicine

## 2020-12-28 DIAGNOSIS — O9122 Nonpurulent mastitis associated with the puerperium: Secondary | ICD-10-CM | POA: Insufficient documentation

## 2020-12-28 DIAGNOSIS — N61 Mastitis without abscess: Secondary | ICD-10-CM | POA: Diagnosis not present

## 2020-12-28 MED ORDER — DICLOXACILLIN SODIUM 500 MG PO CAPS: 500.0000 mg | ORAL_CAPSULE | Freq: Four times a day (QID) | ORAL | 0 refills | Status: AC

## 2020-12-28 NOTE — MAU Provider Note (Signed)
None    S [name] Soc is a 35 y.o. [id number] patient who presents to MAU today with complaint of breast pain. Patient reports bilateral breast pain x4 days. Woke up this morning and noticed some redness in left breast. She thinks pain and redness is related to breast pumping. Breast get hard but improves after she pumps. She reports a fever, however has never taken her temperature. She was seen by lactation today who told her to come in for further evaluation.   O BP 128/77 (BP Location: Right Arm)   Pulse (!) 111   Temp 99 F (37.2 C) (Oral)   Resp 20   Wt 82.8 kg   SpO2 98%   BMI 31.33 kg/m  Physical Exam Vitals and nursing note reviewed. Exam conducted with a chaperone present.  Constitutional:      General: She is not in acute distress.    Appearance: She is obese.  Cardiovascular:     Rate and Rhythm: Tachycardia present.  Pulmonary:     Effort: Pulmonary effort is normal.  Chest:  Breasts:    Right: No mass, nipple discharge or tenderness.     Left: Tenderness present.     Comments: Left breast with red streaking, tenderness and warm. Left nipple is cracked Neurological:     General: No focal deficit present.     Mental Status: She is alert and oriented to person, place, and time.  Psychiatric:        Mood and Affect: Mood normal.        Behavior: Behavior normal.        Thought Content: Thought content normal.        Judgment: Judgment normal.    A Medical screening exam complete Mastitis  P VSS stable, patient afebrile Discharge from MAU in stable condition Dicloxacillin sent to pharmacy Patient to follow up with lactation as previously scheduled Warning signs for worsening condition that would warrant emergency follow-up discussed Patient may return to MAU as needed    Brand Males, CNM 12/28/2020 3:47 PM ,

## 2020-12-28 NOTE — MAU Note (Signed)
Presents with bilateral breast pain x4 days and cracks in nipples.  Reports pumping breast secondary infant in NICU.  S/P Cesarean Section 11/17/2020.

## 2020-12-28 NOTE — Lactation Note (Signed)
This note was copied from a baby's chart.  NICU Lactation Consultation Note  Patient Name: Girl Shadee Rathod Soc [id number] Date: 12/28/2020 Age:35 wk.o.   Subjective Reason for consult: Other (Comment) (RN request) Mother sweating and complaining of breast pain. She endorses fever and L breast that is warm to the touch. Mother has not pumped in past 12 hours. LC provided education and instructions for decreasing breast fullness and pain> LC referred mother to MAU for MD evaluation.   Objective Infant data: Mother's Current Feeding Choice: Breast Milk  Infant feeding assessment Scale for Readiness: 1 Scale for Quality: 3 (stridor at rest and when feeding)    Maternal data: [id number]  C-Section, Low Transverse Significant Breast History:: Inner quadrant L breast pain and erythema  Does the patient have breastfeeding experience prior to this delivery?: Yes  Pumping frequency: rec pumping left breast even when ad lib today Pumped volume: 120 mL (120-180)  WIC Program: Yes WIC Referral Sent?: Yes Pump: DEBP  Assessment Infant: LATCH Score: 7  Feeding Status: Ad lib   Maternal: Milk volume: Normal (left side is the higher producer) Mother has s/s of L breast mastitis.   Intervention/Plan Interventions: Breast feeding basics reviewed; Skin to skin; Education; Assisted with latch; Hand express  Pump Education: Setup, frequency, and cleaning  Plan: Consult Status: Follow-up  NICU Follow-up type: Assist with IDF-2 (Mother does not need to pre-pump before breastfeeding)  Mother to ice breasts for 5-10 minutes followed by pumping. Afterwards, mother to present at MAU for MD evaluation and treatment.  LC will plan f/u visit tomorrow to continue with bf assistance.   Elder Negus 12/28/2020, 11:57 AM

## 2020-12-29 ENCOUNTER — Ambulatory Visit: Payer: Self-pay

## 2020-12-29 NOTE — Lactation Note (Signed)
This note was copied from a baby's chart.  NICU Lactation Consultation Note  Patient Name: Martha [name] Soc [id number] Date: 12/29/2020 Age:35 wk.o.   Subjective Reason for consult: Follow-up assessment; Other (Comment) (recent mastitis dx) Mother presented to MAU yesterday and received antibiotics for L breast mastitis. LC observed slight decrease in erythema this morning, and mother endorses decrease in pain. She continues to pump frequently today and understands the importance of frequent milk removal.   RN advised LC of infant's recent breastfeeding aversion. We discussed increasing sts care and bf'ing only with infant cues.   Mother and RN are aware of LC services.  Objective Infant data: Mother's Current Feeding Choice: Breast Milk  Infant feeding assessment Scale for Readiness: 4  Maternal data: [id number]  C-Section, Low Transverse Significant Breast History:: Inner quadrant L breast pain and erythema  Current breast feeding challenges:: acute maternal illness  WIC Program: Yes WIC Referral Sent?: Yes Pump: DEBP  Assessment Mother is responding to antibiotics.   Intervention/Plan Plan: Consult Status: Follow-up  NICU Follow-up type: Weekly NICU follow up; Assist with IDF-2 (Mother does not need to pre-pump before breastfeeding)  Mother to continue antibiotics, as directed. Mother to pump frequently.   Infant to come to breast with cues. LC will return to assist prn.   Elder Negus 12/29/2020, 10:41 AM

## 2020-12-30 ENCOUNTER — Ambulatory Visit: Payer: Self-pay

## 2020-12-30 NOTE — Lactation Note (Addendum)
This note was copied from a baby's chart. Lactation Consultation Note  Patient Name: Martha [name] Soc [id number] Date: 12/30/2020 Reason for consult: Follow-up assessment;NICU baby;Maternal endocrine disorder;Infant < 6lbs;Other (Comment);Term (mastitis on L breast) Age:35 wk.o.  Visited with mom of 4 49/93 weeks old (adjusted) NICU female, she reports that pain and redness has decreased even further; there is still a red streak on the bottom on her left breast but it's almost fainted. L breast is still tender to touch/pressure; and mom voiced that she hasn't been putting baby to breast because of that.  Right breast looks and feels "normal" to mom, no visible s/s of engorgement or mastitis on R breast, it was soft and tender upon examination. Advised mom to start taking baby to right breast if pain has subsided.   Provided ice packs, reviewed pumping schedule, engorgement prevention/treatment and stressed to mom the importance of finishing her antibiotic course. Mom has been throwing away her breastmilk because it had a slight pinkish tint. Let mom know that it's safe for her to provide that milk to baby and advised her to start keeping it.   Maternal Data  Mom's supply has decreased due to mastitis episode and now is [employer] Mother's Current Feeding Choice: Breast Milk Nipple Type: Dr. Levert Feinstein Preemie  Lactation Tools Discussed/Used Breast pump type: Double-Electric Breast Pump Pump Education: Setup, frequency, and cleaning;Milk Storage Reason for Pumping: NICU infant Pumping frequency: 3 times/24 hours Pumped volume: 60 mL  Interventions Interventions: Breast feeding basics reviewed;DEBP;Ice;Education  Plan of care   Encouraged mom to start pumping consistently every 3 hours, icing the breast was also encouraged prior pumping sessions She'll start baby to breast to breast again on feeding cues at feeding times. Will use NS # 20 PRN, although mom prefers to put baby on  to the bare breast   No other support person at this time. All questions and concerns answered, mom to call NICU LC PRN.  Discharge Discharge Education: Engorgement and breast care Pump: DEBP  Consult Status Consult Status: Follow-up Date: 12/30/20 Follow-up type: In-patient   Kain Milosevic [name] 12/30/2020, 11:28 AM

## 2020-12-31 ENCOUNTER — Ambulatory Visit (INDEPENDENT_AMBULATORY_CARE_PROVIDER_SITE_OTHER): Payer: Medicaid Other | Admitting: Obstetrics and Gynecology

## 2020-12-31 ENCOUNTER — Encounter: Payer: Self-pay | Admitting: Obstetrics and Gynecology

## 2020-12-31 ENCOUNTER — Other Ambulatory Visit: Payer: Self-pay

## 2020-12-31 NOTE — Progress Notes (Signed)
Post Partum Visit Note  Martha [name] Soc is a 35 y.o. [phone] female who presents for a postpartum visit. She is 2  months  postpartum following a primary cesarean section.  I have fully reviewed the prenatal and intrapartum course. The delivery was at [redacted]w[redacted]d gestational weeks.  Anesthesia: spinal. Postpartum course has been un remarkable with the exception of recent treatment for mastitis. Baby is in NICU. Baby is feeding by breast. Bleeding no bleeding. Bowel function is normal. Bladder function is normal. Patient is sexually active. Contraception method is  pt unsure which one . She tends to become pregnant on all options. Postpartum depression screening: negative. EPDS= 4   The pregnancy intention screening data noted above was reviewed. Potential methods of contraception were discussed. The patient elected to proceed with No data recorded.   Edinburgh Postnatal Depression Scale - 12/31/20 1044       Edinburgh Postnatal Depression Scale:  In the Past 7 Days   I have been able to laugh and see the funny side of things. 0    I have looked forward with enjoyment to things. 0    I have blamed myself unnecessarily when things went wrong. 0    I have been anxious or worried for no good reason. 2    I have felt scared or panicky for no good reason. 0    Things have been getting on top of me. 0    I have been so unhappy that I have had difficulty sleeping. 0    I have felt sad or miserable. 2    I have been so unhappy that I have been crying. 0    The thought of harming myself has occurred to me. 0    Edinburgh Postnatal Depression Scale Total 4             Health Maintenance Due  Topic Date Due   OPHTHALMOLOGY EXAM  08/19/2014   HEMOGLOBIN A1C  09/17/2018   Pneumococcal Vaccine 13-70 Years old (2 - PCV) 03/20/2019   FOOT EXAM  03/20/2019   URINE MICROALBUMIN  03/20/2019   INFLUENZA VACCINE  08/20/2020       Review of Systems Pertinent items noted in HPI and remainder of  comprehensive ROS otherwise negative.  Objective:  BP 122/87   Pulse 88   Wt 182 lb (82.6 kg)   BMI 31.24 kg/m    General:  alert, cooperative, and no distress   Breasts:  normal  Lungs: clear to auscultation bilaterally  Heart:  regular rate and rhythm  Abdomen: soft, non-tender; bowel sounds normal; no masses,  no organomegaly   Wound well approximated incision  GU exam:  not indicated       Assessment:    There are no diagnoses linked to this encounter.  Normal postpartum exam.   Plan:   Essential components of care per ACOG recommendations:  1.  Mood and well being: Patient with negative depression screening today. Reviewed local resources for support.  - Patient tobacco use? No.   - hx of drug use? No.    2. Infant care and feeding:  -Patient currently breastmilk feeding? Yes. Reviewed importance of draining breast regularly to support lactation.  -Social determinants of health (SDOH) reviewed in EPIC. No concerns  3. Sexuality, contraception and birth spacing - Patient does not want a pregnancy in the next year.  Desired family size is 5 children.  - Reviewed forms of contraception in tiered fashion. Patient desired  Nexplanon .   - Discussed birth spacing of 18 months  4. Sleep and fatigue -Encouraged family/partner/community support of 4 hrs of uninterrupted sleep to help with mood and fatigue  5. Physical Recovery  - Discussed patients delivery and complications.  - Patient had a C-section emergent. Patient expressed understanding - Patient has urinary incontinence? No. - Patient is safe to resume physical and sexual activity  6.  Health Maintenance - HM due items addressed Yes - Last pap smear  Diagnosis  Date Value Ref Range Status  10/11/2020   Final   - Negative for intraepithelial lesion or malignancy (NILM)   Pap smear not done at today's visit.  -Breast Cancer screening indicated? No.   7. Chronic Disease/Pregnancy Condition follow up:  Hypertension and Type 2 DM  - PCP follow up  Catalina Antigua, MD [facility] for [facility], [facility]  [phone]

## 2021-01-01 ENCOUNTER — Ambulatory Visit: Payer: Self-pay

## 2021-01-01 NOTE — Lactation Note (Signed)
This note was copied from a baby's chart. Lactation Consultation Note  Patient Name: Martha [name] Soc [id number]'N Date: 01/01/2021 Reason for consult: Follow-up assessment;NICU baby;Maternal endocrine disorder;Term Age:35 wk.o.  Visited with mom of 50 35/67 weeks old (adjusted) NICU female, she's reports improvement on her mastitis, pain has decreased as well as redness/inflammation. She still has a very thin red streak but it's barely noticeable, bottom of left breast felt firm upon examination, performed breast massage but mom voiced pain/discomfort upon pressure.   Baby "Martha [name]" is doing better now that she's taking her breastmilk with the oatmeal cereal. Mom voiced concerns on what to do upon discharge if her milk dries out, she doesn't trust formula, due to all the recalls and the history she had with her other babies, they did not do well with formula. Discussed milk sharing options with mom.   Maternal Data  Moms' supply has greatly decreased, she's afraid it will dry out completely. Reviewed strategies to increase her supply, in addition to power pumping, she'll also ask her provider if she can take [place] (she has DM2).  Feeding Mother's Current Feeding Choice: Breast Milk Nipple Type: Dr. Irving Burton level 4  Lactation Tools Discussed/Used Tools: Pump;Flanges Flange Size: 24 Breast pump type: Double-Electric Breast Pump Pump Education: Setup, frequency, and cleaning;Milk Storage Reason for Pumping: NICU infant Pumping frequency: 4-5 times/24 hours Pumped volume: 15 mL (15-20 ml)  Interventions Interventions: Breast feeding basics reviewed;DEBP;Education  Plan of care   Encouraged mom to start pumping consistently every 3 hours, icing the breast was also encouraged prior pumping sessions She'll take baby to breast for comfort if she starts cueing, but she's aware to limit the BF sessions to no more than 20 minutes/time   Older sibling (50 y.o) present and supportive and caring  for baby. All questions and concerns answered, mom to call NICU LC PRN.   Discharge Pump: DEBP  Consult Status Consult Status: Follow-up Date: 01/01/21 Follow-up type: In-patient   Bradley Bostelman [name] 01/01/2021, 12:30 PM

## 2021-01-03 ENCOUNTER — Ambulatory Visit: Payer: Self-pay

## 2021-01-03 NOTE — Lactation Note (Signed)
This note was copied from a baby's chart. Lactation Consultation Note  Patient Name: Martha [name] Soc [id number] Date: 01/03/2021   Age:35 wk.o.  Lactation attempted to follow up. Ms. [name] older daughter was present, but [name] was not. Lactation to follow up at another time.   Feeding Nipple Type: Dr. Irving Burton level 4    Walker Shadow 01/03/2021, 2:38 PM

## 2021-01-04 ENCOUNTER — Ambulatory Visit: Payer: Self-pay

## 2021-01-04 NOTE — Lactation Note (Signed)
This note was copied from a baby's chart. Lactation Consultation Note  Patient Name: Martha [name] Soc OHYWV'P Date: 01/04/2021 Reason for consult: Follow-up assessment;NICU baby;Maternal endocrine disorder;Term Age:35 wk.o.  Visited with mom of 58 69/11 weeks old NICU female, she reports that she still has the knots on her left breast (the side where she had the mastitis) and voiced she was on her second round of "antibiotic" treatment. However when [facility] started asking what's the second antibiotic (and compared it to the first one), found out that mom was taking tylenol instead of an antibiotic until yesterday afternoon (which she thought it was an antibiotic) for her mastitis.  The only antibiotic prescribed to her was just picked up yesterday afternoon at the [facility] pharmacy, family had issues picking it up because the pharmacy didn't get to Rx from mom's provider until a week later. Mom has been taking it once/day even though is prescribed 4 times/24 hours. Provided extensive education regarding mastitis treatment, antibiotic schedule and pumping schedule as well, she's been pumping only one side at a time.   Second visit @ 11:15 AM  Provided assistance with pumping from beginning to end, showed mom how to do deep tissue massage, provided another pumping band and advised bilateral pumping. L breast is no longer showing a red streak, just a very faint discoloration on the bottom of the breast, almost invisible to the naked eye. R breast looks WNL.  SLP Irving Burton assisted with bottle feeding, and discussed with mom proposed feeding plan for baby "Martha [name]" and explained to importance of adding cereal to the 1 oz. Breastmilk + 1 oz. Formula ratio. LC advised mom not to mix different pumping times in the same bottle either, even though she's not getting the same volumes she used to get before the mastitis.  Maternal Data  Mom's supply has slightly increased but is still BNL.  Feeding Mother's Current  Feeding Choice: Breast Milk Nipple Type: Dr. Irving Burton level 4  Lactation Tools Discussed/Used Tools: Pump Breast pump type: Double-Electric Breast Pump Pump Education: Setup, frequency, and cleaning;Milk Storage Reason for Pumping: NICU infant Pumping frequency: 6-7 times/24 hours Pumped volume: 20 mL (only from right breast) left breast; which is the side where she had the mastitis is only drops Mom pumped 40 ml with NICU LC during LC consultation (28 ml-R and 12 ml-L)  Interventions Interventions: Breast feeding basics reviewed;Education  Plan of care   Encouraged mom to start pumping consistently every 3 hours, and do follow all the steps as shown when [facility] assisted with pumping, including bilateral pumping She'll take baby to breast for comfort if she starts cueing, but in the meantime baby will continue working on bottle feedings with the thickener added as recommended by SLP   FOB present. All questions and concerns answered, mom to call NICU LC PRN.  Discharge Discharge Education: Engorgement and breast care  Consult Status Consult Status: Follow-up Date: 01/04/21 Follow-up type: In-patient   Jillyn Stacey [name] 01/04/2021, 10:56 AM

## 2022-02-04 ENCOUNTER — Encounter: Payer: Self-pay | Admitting: Certified Nurse Midwife

## 2022-02-04 ENCOUNTER — Ambulatory Visit (INDEPENDENT_AMBULATORY_CARE_PROVIDER_SITE_OTHER): Payer: Self-pay

## 2022-02-04 ENCOUNTER — Other Ambulatory Visit: Payer: Self-pay

## 2022-02-04 DIAGNOSIS — O0990 Supervision of high risk pregnancy, unspecified, unspecified trimester: Secondary | ICD-10-CM | POA: Insufficient documentation

## 2022-02-04 NOTE — Progress Notes (Signed)
New OB Intake  I connected withNAME@  on 02/04/22 at  8:15 AM EST by In Person Visit and verified that I am speaking with the correct person using two identifiers. Nurse is located at [facility] and pt is located at [facility].  I discussed the limitations, risks, security and privacy concerns of performing an evaluation and management service by telephone and the availability of in person appointments. I also discussed with the patient that there may be a patient responsible charge related to this service. The patient expressed understanding and agreed to proceed.  I explained I am completing New OB Intake today. We discussed EDD of 08/23/22 that is based on LMP of 11/16/21. Pt is G6/P3. I reviewed her allergies, medications, Medical/Surgical/OB history, and appropriate screenings. I informed her of [facility] services. [facility] information placed in AVS. Based on history, this is a high risk pregnancy.  Patient Active Problem List   Diagnosis Date Noted   Supervision of high risk pregnancy, antepartum 11/01/2020   Severe pre-eclampsia 11/01/2020   Chronic hypertension affecting pregnancy 10/11/2020   Positive for macroalbuminuria 03/22/2018   Diabetes mellitus type 2, uncontrolled, with complications 63/14/9702   Essential hypertension 03/19/2018   Obesity affecting pregnancy, antepartum 03/19/2018   Diabetes mellitus affecting pregnancy, antepartum 08/10/2013   Language barrier 08/10/2013   Obesity 08/10/2013   History of depression 08/10/2013    Concerns addressed today  Delivery Plans Plans to deliver at [facility] Patient given information for [facility] Healthy Baby website for more information about [facility]. Patient is not interested in water birth. Offered upcoming OB visit with CNM to discuss further.  MyChart/Babyscripts MyChart access verified. I explained pt will have some visits in office and some virtually. Babyscripts instructions given and order placed. Patient verifies  receipt of registration text/e-mail. Account successfully created and app downloaded.  Blood Pressure Cuff/Weight Scale Patient is self-pay; explained patient will be given BP cuff at first prenatal appt. Explained after first prenatal appt pt will check weekly and document in 75. Patient does have weight scale.  Anatomy US Explained first scheduled [place] will be around 19 weeks. Anatomy US scheduled for  at . Pt notified to arrive at .  Labs Discussed Johnsie Cancel genetic screening with patient. Would like both Panorama and Horizon drawn at new OB visit. Routine prenatal labs needed.  COVID Vaccine Patient has had COVID vaccine.   Is patient a CenteringPregnancy candidate?  Not sure of due date Declined due to  na Not a candidate due to  na If accepted,    Is patient a Mom+Baby Combined Care candidate?  Not a candidate   If accepted, Mom+Baby staff notified  Social Determinants of Health Food Insecurity: Patient denies food insecurity. WIC Referral: Patient is interested in referral to [facility].  Transportation: Patient denies transportation needs. Childcare: Discussed no children allowed at ultrasound appointments. Offered childcare services; patient declines childcare services at this time.  First visit review I reviewed new OB appt with patient. I explained they will have a provider visit that includes . Explained pt will be seen by Gaylan Gerold, CNM at first visit; encounter routed to appropriate provider. Explained that patient will be seen by pregnancy navigator following visit with provider.   Bethanne Ginger, CMA 02/04/2022  8:32 AM

## 2022-02-04 NOTE — Progress Notes (Signed)
Pt advsd that previous records should have arrived to verify dating, Pt was not sure of LMP, states that she remembers due date to bi in June.

## 2022-02-05 ENCOUNTER — Ambulatory Visit (HOSPITAL_COMMUNITY)
Admission: RE | Admit: 2022-02-05 | Discharge: 2022-02-05 | Disposition: A | Discharge status: Home / Self Care | Payer: Self-pay | Source: Ambulatory Visit | Attending: Certified Nurse Midwife | Admitting: Certified Nurse Midwife

## 2022-02-05 ENCOUNTER — Other Ambulatory Visit (HOSPITAL_COMMUNITY)
Admission: RE | Admit: 2022-02-05 | Discharge: 2022-02-05 | Disposition: A | Discharge status: Home / Self Care | Payer: Self-pay | Source: Ambulatory Visit | Attending: Certified Nurse Midwife | Admitting: Certified Nurse Midwife

## 2022-02-05 ENCOUNTER — Ambulatory Visit (INDEPENDENT_AMBULATORY_CARE_PROVIDER_SITE_OTHER): Payer: Self-pay | Admitting: Certified Nurse Midwife

## 2022-02-05 ENCOUNTER — Other Ambulatory Visit: Payer: Self-pay | Admitting: Certified Nurse Midwife

## 2022-02-05 VITALS — BP 112/66 | HR 78 | Wt 178.0 lb

## 2022-02-05 DIAGNOSIS — O0932 Supervision of pregnancy with insufficient antenatal care, second trimester: Secondary | ICD-10-CM

## 2022-02-05 DIAGNOSIS — O24919 Unspecified diabetes mellitus in pregnancy, unspecified trimester: Secondary | ICD-10-CM

## 2022-02-05 DIAGNOSIS — Z3A11 11 weeks gestation of pregnancy: Secondary | ICD-10-CM

## 2022-02-05 DIAGNOSIS — O0990 Supervision of high risk pregnancy, unspecified, unspecified trimester: Secondary | ICD-10-CM

## 2022-02-05 DIAGNOSIS — O24912 Unspecified diabetes mellitus in pregnancy, second trimester: Secondary | ICD-10-CM

## 2022-02-05 DIAGNOSIS — O10919 Unspecified pre-existing hypertension complicating pregnancy, unspecified trimester: Secondary | ICD-10-CM

## 2022-02-05 DIAGNOSIS — Z3A19 19 weeks gestation of pregnancy: Secondary | ICD-10-CM

## 2022-02-05 DIAGNOSIS — R8271 Bacteriuria: Secondary | ICD-10-CM

## 2022-02-05 DIAGNOSIS — O0991 Supervision of high risk pregnancy, unspecified, first trimester: Secondary | ICD-10-CM | POA: Insufficient documentation

## 2022-02-05 NOTE — Progress Notes (Signed)
History:   Martha [name] is a 37 y.o. [id number] at [redacted]w[redacted]d by midtrimester ultrasound being seen today for her first obstetrical visit.  Her obstetrical history is significant for  type 2 diabetes and gestational hypertension with preeclampsia. Was hospitalized for preeclampsia with last pregnancy resulting in preterm Cesarean delivery at 32wks. Stopped taking all meds once she had the baby because she "felt good" . Patient does intend to breast feed. Pregnancy history fully reviewed.  Patient reports no complaints.   HISTORY: OB History  Gravida Para Term Preterm AB Living  6 5 3 2  0 5  SAB IAB Ectopic Multiple Live Births  0 0 0 0 5    # Outcome Date GA Lbr Len/2nd Weight Sex Delivery Anes PTL Lv  6 Current           5 Preterm 11/17/20 [redacted]w[redacted]d  3 lb 3.2 oz (1.45 kg) F CS-LTranv Spinal  LIV     Birth Comments: preterm     Name: CHARLITA, BRIAN     Apgar1: 7  Apgar5: 9  4 Preterm 11/21/13 [redacted]w[redacted]d 01:00 / 00:03 7 lb 15.3 oz (3.609 kg) M Vag-Spont Local  LIV     Birth Comments: On phototherapy for jaundice     Name: GIGI, ONSTAD     Apgar1: 7  Apgar5: 8  3 Term 01/28/08 [redacted]w[redacted]d  7 lb (3.175 kg) M Vag-Spont None  LIV  2 Term 08/02/03 [redacted]w[redacted]d  7 lb 5 oz (3.317 kg) M Vag-Spont None  LIV  1 Term 11/02/99 [redacted]w[redacted]d  6 lb (2.722 kg) F Vag-Spont None  LIV    Last pap smear was done 2022 and was normal  Past Medical History:  Diagnosis Date   Diabetes mellitus type 2, uncontrolled, with complications 29/56/2130   Essential hypertension 03/19/2018   Kidney infection 0105/2017   Severe pre-eclampsia 11/01/2020   Past Surgical History:  Procedure Laterality Date   APPENDECTOMY     CESAREAN SECTION N/A 11/17/2020   Procedure: CESAREAN SECTION;  Surgeon: Woodroe Mode, MD;  Location: [facility] LD ORS;  Service: Obstetrics;  Laterality: N/A;   CHOLECYSTECTOMY     Family History  Problem Relation Age of Onset   Diabetes Mother    Hypertension Mother    Heart disease Father    Mental  retardation Sister    Social History   Tobacco Use   Smoking status: Never   Smokeless tobacco: Never  Vaping Use   Vaping Use: Never used  Substance Use Topics   Alcohol use: No   Drug use: No   Allergies  Allergen Reactions   Influenza Vaccines Itching and Rash   Current Outpatient Medications on File Prior to Visit  Medication Sig Dispense Refill   Prenatal Vit-Fe Fumarate-FA (PREPLUS) 27-1 MG TABS Take 1 tablet by mouth daily. 30 tablet 8   acetaminophen (TYLENOL) 500 MG tablet Take 2 tablets (1,000 mg total) by mouth every 6 (six) hours. (Patient not taking: Reported on 02/05/2022) 30 tablet 0   dibucaine (NUPERCAINAL) 1 % OINT Place 1 application rectally as needed for hemorrhoids. (Patient not taking: Reported on 12/31/2020) 28 g 0   furosemide (LASIX) 20 MG tablet Take 1 tablet (20 mg total) by mouth 2 (two) times daily. (Patient not taking: Reported on 12/31/2020) 4 tablet 0   ibuprofen (ADVIL) 600 MG tablet Take 1 tablet (600 mg total) by mouth every 6 (six) hours. (Patient not taking: Reported on 02/04/2022) 30 tablet 0   labetalol (  NORMODYNE) 200 MG tablet Take 2 tablets (400 mg total) by mouth 2 (two) times daily. 120 tablet 0   metFORMIN (GLUCOPHAGE) 1000 MG tablet Take 1 tablet (1,000 mg total) by mouth 2 (two) times daily with a meal. (Patient not taking: Reported on 02/04/2022) 60 tablet 0   NIFEdipine (ADALAT CC) 60 MG 24 hr tablet Take 1 tablet (60 mg total) by mouth 2 (two) times daily. (Patient not taking: Reported on 02/04/2022) 60 tablet 0   phenylephrine-shark liver oil-mineral oil-petrolatum (PREPARATION H) 0.25-14-74.9 % rectal ointment Place 1 application rectally 2 (two) times daily as needed for hemorrhoids. (Patient not taking: Reported on 12/31/2020) 28 g 1   No current facility-administered medications on file prior to visit.    Review of Systems Pertinent items noted in HPI and remainder of comprehensive ROS otherwise negative. Physical Exam:   Vitals:    02/05/22 1435 02/05/22 1437  BP:  112/66  Pulse:  78  Weight: 178 lb (80.7 kg) 178 lb (80.7 kg)   Fetal Heart Rate (bpm): 150  Constitutional: Well-developed, well-nourished pregnant female in no acute distress.  HEENT: PERRLA Skin: normal color and turgor, no rash Cardiovascular: normal rate & rhythm, warm and well perfused Respiratory: normal effort, no problems with respiration noted GI: Abd soft, non-tender, pos BS x 4, gravid appropriate for gestational age MS: Extremities nontender, no edema, normal ROM Neurologic: Alert and oriented x 4.  GU: no CVA tenderness Pelvic: exam deferred  Assessment:    Pregnancy: [id number] Patient Active Problem List   Diagnosis Date Noted   Supervision of high risk pregnancy, antepartum 02/04/2022   Chronic hypertension affecting pregnancy 10/11/2020   Positive for macroalbuminuria 03/22/2018   Diabetes mellitus type 2, uncontrolled, with complications 03/19/2018   Essential hypertension 03/19/2018   Obesity affecting pregnancy, antepartum 03/19/2018   Diabetes mellitus affecting pregnancy, antepartum 08/10/2013   Language barrier 08/10/2013   Obesity 08/10/2013   History of depression 08/10/2013     Plan:    1. Supervision of high risk pregnancy in first trimester - No complaints, feeling well, surprised by more advanced gestation than expected - Culture, OB Urine - GC/Chlamydia probe amp ([place])not at [facility] - CBC/D/Plt+RPR+Rh+ABO+RubIgG... - [place] MFM OB DETAIL +14 WK; Future  2. [redacted] weeks gestation of pregnancy - Per dating U/S this morning - AFP, Serum, Open Spina Bifida  3. Chronic hypertension affecting pregnancy - BP normal today, will get baseline labs - CMP14+EGFR - Protein / creatinine ratio, urine  4. Diabetes mellitus affecting pregnancy, antepartum - Hemoglobin A1c - Ambulatory referral to Nutrition and Diabetic Education  5. Initial obstetric visit in second trimester - Initial labs drawn. - Continue  prenatal vitamins. - Problem list reviewed and updated. - Genetic Screening discussed, First trimester screen, Quad screen, and NIPS: ordered. - Ultrasound discussed; fetal anatomic survey: ordered. - Anticipatory guidance about prenatal visits given including labs, ultrasounds, and testing. - Discussed usage of Babyscripts and virtual visits as additional source of managing and completing prenatal visits in midst of coronavirus and pandemic.   - Encouraged to complete MyChart Registration for her ability to review results, send requests, and have questions addressed.  - The nature of [facility] [facility]ulty Practice with multiple MDs and Advanced Practice Providers was explained to patient; also emphasized that residents, students are part of our team. - Routine obstetric precautions reviewed. Encouraged to seek out care at office or emergency room [facility] preferred) for urgent and/or emergent concerns.  Return  in about 4 weeks (around 03/05/2022) for IN-PERSON, HOB.    Gaylan Gerold, MSN, CNM, [place] Certified Nurse Midwife, [facility]

## 2022-02-06 LAB — PROTEIN / CREATININE RATIO, URINE
Creatinine, Urine: 42.6 mg/dL
Protein, Ur: 87.9 mg/dL
Protein/Creat Ratio: 2063 mg/g creat — ABNORMAL HIGH (ref 0–200)

## 2022-02-06 LAB — GC/CHLAMYDIA PROBE AMP (~~LOC~~) NOT AT ARMC
Chlamydia: NEGATIVE
Comment: NEGATIVE
Comment: NORMAL
Neisseria Gonorrhea: NEGATIVE

## 2022-02-07 LAB — CBC/D/PLT+RPR+RH+ABO+RUBIGG...
Antibody Screen: NEGATIVE
Basophils Absolute: 0 x10E3/uL (ref 0.0–0.2)
Basos: 1 %
EOS (ABSOLUTE): 0.3 x10E3/uL (ref 0.0–0.4)
Eos: 5 %
HCV Ab: NONREACTIVE
HIV Screen 4th Generation wRfx: NONREACTIVE
Hematocrit: 38.6 % (ref 34.0–46.6)
Hemoglobin: 12.9 g/dL (ref 11.1–15.9)
Hepatitis B Surface Ag: NEGATIVE
Immature Grans (Abs): 0.1 x10E3/uL (ref 0.0–0.1)
Immature Granulocytes: 1 %
Lymphocytes Absolute: 1.5 x10E3/uL (ref 0.7–3.1)
Lymphs: 24 %
MCH: 30.6 pg (ref 26.6–33.0)
MCHC: 33.4 g/dL (ref 31.5–35.7)
MCV: 92 fL (ref 79–97)
Monocytes Absolute: 0.4 x10E3/uL (ref 0.1–0.9)
Monocytes: 6 %
Neutrophils Absolute: 4 x10E3/uL (ref 1.4–7.0)
Neutrophils: 63 %
Platelets: 396 x10E3/uL (ref 150–450)
RBC: 4.22 x10E6/uL (ref 3.77–5.28)
RDW: 12 % (ref 11.7–15.4)
RPR Ser Ql: NONREACTIVE
Rh Factor: POSITIVE
Rubella Antibodies, IGG: 5.62 {index}
WBC: 6.2 x10E3/uL (ref 3.4–10.8)

## 2022-02-07 LAB — AFP, SERUM, OPEN SPINA BIFIDA
AFP MoM: 0.81
AFP Value: 37.1 ng/mL
Gest. Age on Collection Date: 19.1 weeks
Maternal Age At EDD: 37.4 yr
OSBR Risk 1 IN: 10000
Test Results:: NEGATIVE
Weight: 178 [lb_av]

## 2022-02-07 LAB — CMP14+EGFR
ALT: 12 IU/L (ref 0–32)
AST: 9 IU/L (ref 0–40)
Albumin/Globulin Ratio: 1.1 — ABNORMAL LOW (ref 1.2–2.2)
Albumin: 3.4 g/dL — ABNORMAL LOW (ref 3.9–4.9)
Alkaline Phosphatase: 83 IU/L (ref 44–121)
BUN/Creatinine Ratio: 23 (ref 9–23)
BUN: 9 mg/dL (ref 6–20)
Bilirubin Total: <0.2 mg/dL (ref 0.0–1.2)
CO2: 19 mmol/L — ABNORMAL LOW (ref 20–29)
Calcium: 9.8 mg/dL (ref 8.7–10.2)
Chloride: 101 mmol/L (ref 96–106)
Creatinine, Ser: 0.4 mg/dL — ABNORMAL LOW (ref 0.57–1.00)
Globulin, Total: 3 g/dL (ref 1.5–4.5)
Glucose: 286 mg/dL — ABNORMAL HIGH (ref 70–99)
Potassium: 4.4 mmol/L (ref 3.5–5.2)
Sodium: 134 mmol/L (ref 134–144)
Total Protein: 6.4 g/dL (ref 6.0–8.5)
eGFR: 131 mL/min/{1.73_m2} (ref >59)

## 2022-02-07 LAB — HEMOGLOBIN A1C
Est. average glucose Bld gHb Est-mCnc: 260 mg/dL
Hgb A1c MFr Bld: 10.7 % — ABNORMAL HIGH (ref 4.8–5.6)

## 2022-02-07 LAB — HCV INTERPRETATION

## 2022-02-08 LAB — URINE CULTURE, OB REFLEX

## 2022-02-08 LAB — CULTURE, OB URINE

## 2022-02-11 ENCOUNTER — Telehealth: Payer: Self-pay

## 2022-02-11 ENCOUNTER — Other Ambulatory Visit: Payer: Self-pay

## 2022-02-11 MED ORDER — AMOXICILLIN 500 MG PO CAPS
500.0000 mg | ORAL_CAPSULE | Freq: Three times a day (TID) | ORAL | 0 refills | Status: AC
Start: 2022-02-11 — End: 2022-03-03
  Filled 2022-02-11 – 2022-02-19 (×2): qty 21, 7d supply, fill #0

## 2022-02-11 MED ORDER — INSULIN NPH (HUMAN) (ISOPHANE) 100 UNIT/ML ~~LOC~~ SUSP
10.0000 [IU] | Freq: Two times a day (BID) | SUBCUTANEOUS | 3 refills | Status: DC
Start: 2022-02-11 — End: 2022-03-05
  Filled 2022-02-11: qty 15, 75d supply, fill #0
  Filled 2022-02-19: qty 10, 28d supply, fill #0

## 2022-02-11 MED ORDER — INSULIN NPH (HUMAN) (ISOPHANE) 100 UNIT/ML ~~LOC~~ SUSP
10.0000 [IU] | Freq: Two times a day (BID) | SUBCUTANEOUS | 3 refills | Status: DC
  Filled 2022-02-11 – 2022-02-17 (×2): qty 10, 50d supply, fill #0

## 2022-02-11 MED ORDER — "INSULIN SYRINGE-NEEDLE U-100 30G X 5/16"" 0.3 ML MISC"
Freq: Two times a day (BID) | 0 refills | Status: AC
  Filled 2022-02-11 – 2022-02-20 (×2): qty 100, 50d supply, fill #0

## 2022-02-11 NOTE — Telephone Encounter (Signed)
Attempt to call patient to inform on results and provider's advising. Martha [name] interpreter assist with the call. Will try to call patient again. LVM for patient to call back.  BY/CMA

## 2022-02-11 NOTE — Progress Notes (Signed)
Attempt to call patient to inform on results and provider advising. Martha [name] Spanish interpreter assist with the call. No answer and LVM to call back.

## 2022-02-11 NOTE — Addendum Note (Signed)
Addended by: Gaylan Gerold on: 02/11/2022 08:38 AM   Modules accepted: Orders

## 2022-02-11 NOTE — Telephone Encounter (Signed)
-----  Message from Gabriel Carina, [place] sent at 02/11/2022  8:42 AM EST ----- Please call the patient with Spanish interpreter to let her know that she needs to be on insulin twice daily to start + antibiotics for her UTI. Please stress the importance of the insulin as her uncontrolled blood sugar can cause heart malformations for the baby. Also let her know that the scripts were sent to the [facility] on [address] and give education about the discount card.

## 2022-02-13 ENCOUNTER — Other Ambulatory Visit: Payer: Self-pay

## 2022-02-14 LAB — PANORAMA PRENATAL TEST FULL PANEL:PANORAMA TEST PLUS 5 ADDITIONAL MICRODELETIONS: FETAL FRACTION: 6.4

## 2022-02-16 LAB — HORIZON CUSTOM: REPORT SUMMARY: NEGATIVE

## 2022-02-17 ENCOUNTER — Other Ambulatory Visit: Payer: Self-pay

## 2022-02-19 ENCOUNTER — Other Ambulatory Visit: Payer: Self-pay | Admitting: Family Medicine

## 2022-02-19 ENCOUNTER — Ambulatory Visit (INDEPENDENT_AMBULATORY_CARE_PROVIDER_SITE_OTHER): Payer: Self-pay | Admitting: Obstetrics and Gynecology

## 2022-02-19 ENCOUNTER — Other Ambulatory Visit: Payer: Self-pay

## 2022-02-19 VITALS — BP 126/73 | HR 84 | Wt 179.1 lb

## 2022-02-19 DIAGNOSIS — O24919 Unspecified diabetes mellitus in pregnancy, unspecified trimester: Secondary | ICD-10-CM

## 2022-02-19 DIAGNOSIS — O0992 Supervision of high risk pregnancy, unspecified, second trimester: Secondary | ICD-10-CM

## 2022-02-19 DIAGNOSIS — O0990 Supervision of high risk pregnancy, unspecified, unspecified trimester: Secondary | ICD-10-CM

## 2022-02-19 DIAGNOSIS — O09522 Supervision of elderly multigravida, second trimester: Secondary | ICD-10-CM

## 2022-02-19 DIAGNOSIS — O10912 Unspecified pre-existing hypertension complicating pregnancy, second trimester: Secondary | ICD-10-CM

## 2022-02-19 DIAGNOSIS — O10919 Unspecified pre-existing hypertension complicating pregnancy, unspecified trimester: Secondary | ICD-10-CM

## 2022-02-19 DIAGNOSIS — Z789 Other specified health status: Secondary | ICD-10-CM

## 2022-02-19 DIAGNOSIS — Z3A21 21 weeks gestation of pregnancy: Secondary | ICD-10-CM

## 2022-02-19 DIAGNOSIS — O24912 Unspecified diabetes mellitus in pregnancy, second trimester: Secondary | ICD-10-CM

## 2022-02-19 DIAGNOSIS — Z98891 History of uterine scar from previous surgery: Secondary | ICD-10-CM

## 2022-02-19 MED ORDER — ASPIRIN 81 MG PO CHEW
81.0000 mg | CHEWABLE_TABLET | Freq: Every day | ORAL | 5 refills | Status: DC
  Filled 2022-02-19: qty 30, 30d supply, fill #0

## 2022-02-19 MED ORDER — ASPIRIN 81 MG PO CHEW: 81.0000 mg | CHEWABLE_TABLET | Freq: Every day | ORAL | 5 refills | Status: DC

## 2022-02-19 NOTE — Progress Notes (Signed)
Late entry: A1C results reviewed with Dr. Dione Plover on 02/12/22, also reviewed patient presentation and findings with diabetes educator, Levie Heritage (who recalls caring for the patient in her previous pregnancy). Since patient has been untreated and has no way to check her blood sugar at this time, Dr. Dione Plover suggested starting her on 10U NPH BID until she can get a glucometer, with more precise dosing after seeing Levada Dy and at next MD visit. She was scheduled with Levada Dy quickly and corrected NPH order to the [place] after confirming that [facility] prescription would only be $10 (pt is uninsured). Should be an MD only patient going forward.  Gaylan Gerold, CNM, MSN, [place] Certified Nurse Midwife, [facility]

## 2022-02-20 ENCOUNTER — Ambulatory Visit (INDEPENDENT_AMBULATORY_CARE_PROVIDER_SITE_OTHER): Payer: Self-pay | Admitting: Registered"

## 2022-02-20 ENCOUNTER — Other Ambulatory Visit: Payer: Self-pay

## 2022-02-20 DIAGNOSIS — O24919 Unspecified diabetes mellitus in pregnancy, unspecified trimester: Secondary | ICD-10-CM

## 2022-02-20 DIAGNOSIS — Z3A21 21 weeks gestation of pregnancy: Secondary | ICD-10-CM

## 2022-02-20 NOTE — Progress Notes (Signed)
   PRENATAL VISIT NOTE  Subjective:  Martha [name] is a 37 y.o. [phone] at [redacted]w[redacted]d being seen today for ongoing prenatal care.  She is currently monitored for the following issues for this high-risk pregnancy and has Diabetes mellitus affecting pregnancy, antepartum; Language barrier; Obesity; History of depression; Diabetes mellitus type 2, uncontrolled, with complications; Essential hypertension; Obesity affecting pregnancy, antepartum; Positive for macroalbuminuria; Chronic hypertension affecting pregnancy; Supervision of high risk pregnancy, antepartum; History of cesarean delivery; and Advanced maternal age in multigravida, second trimester on their problem list.  Patient doing well with no acute concerns today. She reports no complaints.  Contractions: Not present. Vag. Bleeding: None.  Movement: Present. Denies leaking of fluid.   The following portions of the patient's history were reviewed and updated as appropriate: allergies, current medications, past family history, past medical history, past social history, past surgical history and problem list. Problem list updated.  Objective:   Vitals:   02/19/22 1545  BP: 126/73  Pulse: 84  Weight: 179 lb 1.6 oz (81.2 kg)    Fetal Status: Fetal Heart Rate (bpm): 153   Movement: Present     General:  Alert, oriented and cooperative. Patient is in no acute distress.  Skin: Skin is warm and dry. No rash noted.   Cardiovascular: Normal heart rate noted  Respiratory: Normal respiratory effort, no problems with respiration noted  Abdomen: Soft, gravid, appropriate for gestational age.  Pain/Pressure: Absent     Pelvic: Cervical exam deferred        Extremities: Normal range of motion.  Edema: None  Mental Status:  Normal mood and affect. Normal behavior. Normal judgment and thought content.   Assessment and Plan:  Pregnancy: [id number] at [redacted]w[redacted]d  1. [redacted] weeks gestation of pregnancy   2. Chronic hypertension affecting pregnancy BP WNL,  will start baby ASA as it was not previously prescribed  - aspirin 81 MG chewable tablet; Chew 1 tablet (81 mg total) by mouth daily.  Dispense: 30 tablet; Refill: 5  3. Diabetes mellitus affecting pregnancy, antepartum Pt's A1c was 10.7.  Explained to the patient that uncontrolled diabetes will put her pregnancy at much higher risk, she missed her initial diabetes appointment but has been rescheduled.  I am concerned that compliance may be an issue as the patient had not done any maintenance for her diabetes previously.  - aspirin 81 MG chewable tablet; Chew 1 tablet (81 mg total) by mouth daily.  Dispense: 30 tablet; Refill: 5  4. Supervision of high risk pregnancy, antepartum Continue routine prenatal care  5. Language barrier Live interpreter present  6. History of cesarean delivery Pt desires repeat c/s with BTL if possible, consent needs to be signed  7. Advanced maternal age in multigravida, second trimester   Preterm labor symptoms and general obstetric precautions including but not limited to vaginal bleeding, contractions, leaking of fluid and fetal movement were reviewed in detail with the patient.  Please refer to After Visit Summary for other counseling recommendations.   Return in about 2 weeks (around 03/05/2022) for [facility], in person.   Lynnda Shields, MD Faculty Attending Center for [facility]

## 2022-02-20 NOTE — Progress Notes (Signed)
Patient was seen for Type 2 Diabetes in pregnancy self-management on 02/20/22  Start time 1430 and End time 1530   Estimated due date: 06/29/2022; [redacted]w[redacted]d  [employer] video Interpreter Elray Mcgregor #[id number]  Clinical: Medications: NPH 10 bid; metformin 1000 bid -pt not taking because Rx 11/20/20 has 0 refills, aspirin Medical History: T2D Labs: OGTT n/a, A1c 10.7% on 02/05/2022  Assessement Pt states she picked up insulin yesterday and started 10 u last night. Pt injected dose as she did last pregnancy, directly into the belly button. CDCES reviewed correct insulin injection technique including site selection and rotation. Patient demonstrated understanding via teach back.   Pt states she did not inject another dose this morning because she was waiting for diabetes education visit to get meter and supplies to check BG.   Pt reports she is not taking metformin because she ran out of original Rx (sent to [facility] in Nov - no refills). Per MD patient saw yesterday, instead of taking metformin, she will be prescribed Regular insulin in addition to NPH.  Pt states she used insulin last pregnancy and concerned about her blood sugar being so high because she delivery last baby at 32 weeks was having issues with fainting, tremors, SOB and pre-eclampsia.  Pt reports frustration not understanding her long list of medications. Pt states she took her AVS to the [employer] and was only given 2 of the medications on the long list.  24 hr Recall: not assessed  Blood glucose monitor given: [employer] Lot # [id number] Strips lot# [id number] Strips Exp: 2023-05-25 CBG: 393 mg/dL; verified with second check 371 mg/dL Pt states 30 min ago she ate 2 tortillas, beef and beans, only drank water  Patient instructed to monitor glucose levels: FBS: 70 - ? 95 mg/dL; 2 hour: ? 120 mg/dL  Patient received handouts: Nutrition Diabetes and Pregnancy Carbohydrate Counting List  Patient will  be seen for follow-up on 02/24/2022 or as needed.

## 2022-02-21 ENCOUNTER — Other Ambulatory Visit: Payer: Self-pay

## 2022-02-21 ENCOUNTER — Other Ambulatory Visit: Payer: Self-pay | Admitting: Obstetrics and Gynecology

## 2022-02-21 ENCOUNTER — Telehealth: Payer: Self-pay

## 2022-02-21 DIAGNOSIS — O24919 Unspecified diabetes mellitus in pregnancy, unspecified trimester: Secondary | ICD-10-CM

## 2022-02-21 MED ORDER — INSULIN LISPRO 100 UNIT/ML IJ SOLN
8.0000 [IU] | Freq: Three times a day (TID) | INTRAMUSCULAR | 11 refills | Status: DC
  Filled 2022-02-21: qty 10, 28d supply, fill #0

## 2022-02-21 NOTE — Progress Notes (Signed)
Regular insulin sent to pharmacy, pt will be informed

## 2022-02-24 ENCOUNTER — Encounter: Payer: Self-pay | Attending: Internal Medicine | Admitting: Registered"

## 2022-02-24 ENCOUNTER — Other Ambulatory Visit: Payer: Self-pay | Admitting: *Deleted

## 2022-02-24 ENCOUNTER — Ambulatory Visit: Payer: Self-pay | Attending: Certified Nurse Midwife

## 2022-02-24 ENCOUNTER — Encounter: Payer: Self-pay | Admitting: Registered"

## 2022-02-24 ENCOUNTER — Other Ambulatory Visit: Payer: Self-pay

## 2022-02-24 ENCOUNTER — Ambulatory Visit: Payer: Self-pay | Admitting: *Deleted

## 2022-02-24 VITALS — BP 145/81 | HR 118

## 2022-02-24 VITALS — BP 139/84 | HR 114

## 2022-02-24 DIAGNOSIS — O24919 Unspecified diabetes mellitus in pregnancy, unspecified trimester: Secondary | ICD-10-CM | POA: Insufficient documentation

## 2022-02-24 DIAGNOSIS — O09899 Supervision of other high risk pregnancies, unspecified trimester: Secondary | ICD-10-CM

## 2022-02-24 DIAGNOSIS — O10912 Unspecified pre-existing hypertension complicating pregnancy, second trimester: Secondary | ICD-10-CM

## 2022-02-24 DIAGNOSIS — O99212 Obesity complicating pregnancy, second trimester: Secondary | ICD-10-CM

## 2022-02-24 DIAGNOSIS — O24112 Pre-existing diabetes mellitus, type 2, in pregnancy, second trimester: Secondary | ICD-10-CM

## 2022-02-24 DIAGNOSIS — O0990 Supervision of high risk pregnancy, unspecified, unspecified trimester: Secondary | ICD-10-CM | POA: Insufficient documentation

## 2022-02-24 DIAGNOSIS — O0991 Supervision of high risk pregnancy, unspecified, first trimester: Secondary | ICD-10-CM | POA: Insufficient documentation

## 2022-02-24 NOTE — Progress Notes (Signed)
Patient was seen for Type 2 Diabetes in pregnancy self-management on 02/24/22  Start time 1430 and End time 1500   Estimated due date: 06/29/2022; [redacted]w[redacted]d  [place] from [facility]  Clinical: Medications: NPH 10 bid; Humalog 8 u tid Medical History: T2D Labs: OGTT n/a, A1c 10.7% on 02/05/2022  Assessment Pt reports she has not picked up additional medication yet. Reviewed action of NPH and fast acting insulin. Pt states she will pick up medication after this appointment.  Pt brought blood sugar log to appointment all values continue to be >300 mg/dL. Pt states she was confused how to use the paper. Pt is checking and recording fasting blood sugar correctly. Pt was checking blood sugar at the same time as she started eating. Pt states how she will correct the timing of SMBG.   Pt states beverages she consumes includes water, juice, coffee, milk. Pt states she was already told this morning at [place] appt not to drink juice. Pt states she has already stopped eating rice and bread because causes gastric upset. Pt states she eats 2-3 tortillas with some meals.   24 hr Recall: not assessed  Plan: Drink only unsweetened beverages: water, a small amount of coffee, not more than 1 glass of milk at a time Limit your tortillas to 2 until you return for additional nutrition education. Take your medications as directed  Handout given: Insulin action of NPH and Regular - CDCES wrote in doses of NPH and Humalog.   Next visit talk about food and if appropriate teach how to mix Humalog and NPH insulin.

## 2022-02-24 NOTE — Telephone Encounter (Signed)
Called pt with Rosemarie Ax to follow up on new insulin prescription sent Friday, 02/21/22. Pt states this was reviewed at diabetes education appt today.

## 2022-02-25 NOTE — Telephone Encounter (Signed)
Patient seen by a MD for her Prenatal Care visit. Her labs were addressed.

## 2022-03-04 ENCOUNTER — Other Ambulatory Visit: Payer: Self-pay

## 2022-03-04 ENCOUNTER — Encounter (HOSPITAL_COMMUNITY): Payer: Self-pay

## 2022-03-04 ENCOUNTER — Inpatient Hospital Stay (HOSPITAL_COMMUNITY)
Admission: EM | Admit: 2022-03-04 | Discharge: 2022-03-04 | Disposition: A | Discharge status: Home / Self Care | Payer: Self-pay | Attending: Obstetrics and Gynecology | Admitting: Obstetrics and Gynecology

## 2022-03-04 DIAGNOSIS — O24112 Pre-existing diabetes mellitus, type 2, in pregnancy, second trimester: Secondary | ICD-10-CM | POA: Insufficient documentation

## 2022-03-04 DIAGNOSIS — R1012 Left upper quadrant pain: Secondary | ICD-10-CM | POA: Insufficient documentation

## 2022-03-04 DIAGNOSIS — O0990 Supervision of high risk pregnancy, unspecified, unspecified trimester: Secondary | ICD-10-CM

## 2022-03-04 DIAGNOSIS — Z79899 Other long term (current) drug therapy: Secondary | ICD-10-CM | POA: Insufficient documentation

## 2022-03-04 DIAGNOSIS — O10912 Unspecified pre-existing hypertension complicating pregnancy, second trimester: Secondary | ICD-10-CM | POA: Insufficient documentation

## 2022-03-04 DIAGNOSIS — Z3A23 23 weeks gestation of pregnancy: Secondary | ICD-10-CM

## 2022-03-04 DIAGNOSIS — O99612 Diseases of the digestive system complicating pregnancy, second trimester: Secondary | ICD-10-CM | POA: Insufficient documentation

## 2022-03-04 DIAGNOSIS — O26892 Other specified pregnancy related conditions, second trimester: Secondary | ICD-10-CM | POA: Insufficient documentation

## 2022-03-04 DIAGNOSIS — R1032 Left lower quadrant pain: Secondary | ICD-10-CM | POA: Insufficient documentation

## 2022-03-04 DIAGNOSIS — K5909 Other constipation: Secondary | ICD-10-CM | POA: Insufficient documentation

## 2022-03-04 DIAGNOSIS — M79672 Pain in left foot: Secondary | ICD-10-CM | POA: Insufficient documentation

## 2022-03-04 DIAGNOSIS — Z794 Long term (current) use of insulin: Secondary | ICD-10-CM | POA: Insufficient documentation

## 2022-03-04 DIAGNOSIS — O09522 Supervision of elderly multigravida, second trimester: Secondary | ICD-10-CM | POA: Insufficient documentation

## 2022-03-04 DIAGNOSIS — M5432 Sciatica, left side: Secondary | ICD-10-CM

## 2022-03-04 DIAGNOSIS — Z3689 Encounter for other specified antenatal screening: Secondary | ICD-10-CM

## 2022-03-04 DIAGNOSIS — K5900 Constipation, unspecified: Secondary | ICD-10-CM

## 2022-03-04 LAB — COMPREHENSIVE METABOLIC PANEL
ALT: 17 U/L (ref 0–44)
AST: 24 U/L (ref 15–41)
Albumin: 2.4 g/dL — ABNORMAL LOW (ref 3.5–5.0)
Alkaline Phosphatase: 62 U/L (ref 38–126)
Anion gap: 12 (ref 5–15)
BUN: 8 mg/dL (ref 6–20)
CO2: 18 mmol/L — ABNORMAL LOW (ref 22–32)
Calcium: 8.3 mg/dL — ABNORMAL LOW (ref 8.9–10.3)
Chloride: 102 mmol/L (ref 98–111)
Creatinine, Ser: 0.5 mg/dL (ref 0.44–1.00)
GFR, Estimated: >60 mL/min (ref >60)
Glucose, Bld: 263 mg/dL — ABNORMAL HIGH (ref 70–99)
Potassium: 3.7 mmol/L (ref 3.5–5.1)
Sodium: 132 mmol/L — ABNORMAL LOW (ref 135–145)
Total Bilirubin: <0.1 mg/dL — ABNORMAL LOW (ref 0.3–1.2)
Total Protein: 6.2 g/dL — ABNORMAL LOW (ref 6.5–8.1)

## 2022-03-04 LAB — URINALYSIS, ROUTINE W REFLEX MICROSCOPIC
Bilirubin Urine: NEGATIVE
Glucose, UA: >=500 mg/dL — AB
Hgb urine dipstick: NEGATIVE
Ketones, ur: NEGATIVE mg/dL
Nitrite: NEGATIVE
Protein, ur: 30 mg/dL — AB
Specific Gravity, Urine: 1.007 (ref 1.005–1.030)
pH: 7 (ref 5.0–8.0)

## 2022-03-04 LAB — CBC WITH DIFFERENTIAL/PLATELET
Abs Immature Granulocytes: 0.05 10*3/uL (ref 0.00–0.07)
Basophils Absolute: 0 10*3/uL (ref 0.0–0.1)
Basophils Relative: 0 %
Eosinophils Absolute: 0.7 10*3/uL — ABNORMAL HIGH (ref 0.0–0.5)
Eosinophils Relative: 10 %
HCT: 33.6 % — ABNORMAL LOW (ref 36.0–46.0)
Hemoglobin: 12.3 g/dL (ref 12.0–15.0)
Immature Granulocytes: 1 %
Lymphocytes Relative: 20 %
Lymphs Abs: 1.4 10*3/uL (ref 0.7–4.0)
MCH: 31.2 pg (ref 26.0–34.0)
MCHC: 36.6 g/dL — ABNORMAL HIGH (ref 30.0–36.0)
MCV: 85.3 fL (ref 80.0–100.0)
Monocytes Absolute: 0.4 10*3/uL (ref 0.1–1.0)
Monocytes Relative: 6 %
Neutro Abs: 4.3 10*3/uL (ref 1.7–7.7)
Neutrophils Relative %: 63 %
Platelets: 351 10*3/uL (ref 150–400)
RBC: 3.94 MIL/uL (ref 3.87–5.11)
RDW: 12.3 % (ref 11.5–15.5)
WBC: 6.9 10*3/uL (ref 4.0–10.5)
nRBC: 0 % (ref 0.0–0.2)

## 2022-03-04 MED ORDER — CYCLOBENZAPRINE HCL 10 MG PO TABS
10.0000 mg | ORAL_TABLET | Freq: Three times a day (TID) | ORAL | 1 refills | Status: DC | PRN
  Filled 2022-03-04: qty 30, 10d supply, fill #0

## 2022-03-04 MED ORDER — SORBITOL 70 % SOLN
960.0000 mL | TOPICAL_OIL | Freq: Once | ORAL | Status: AC
  Administered 2022-03-04: 960 mL via RECTAL
  Filled 2022-03-04: qty 240

## 2022-03-04 MED ORDER — CYCLOBENZAPRINE HCL 5 MG PO TABS: 10.0000 mg | ORAL_TABLET | Freq: Once | ORAL | Status: DC

## 2022-03-04 MED ORDER — MAG-OXIDE 200 MG PO TABS
400.0000 mg | ORAL_TABLET | Freq: Every day | ORAL | 3 refills | Status: DC
  Filled 2022-03-04: qty 60, 30d supply, fill #0

## 2022-03-04 MED ORDER — CYCLOBENZAPRINE HCL 5 MG PO TABS
5.0000 mg | ORAL_TABLET | Freq: Once | ORAL | Status: AC
  Administered 2022-03-04: 5 mg via ORAL
  Filled 2022-03-04: qty 1

## 2022-03-04 NOTE — MAU Provider Note (Addendum)
Chief Complaint:  Leg Pain, Abdominal Pain, Back Pain, and Constipation   Event Date/Time   First Provider Initiated Contact with Patient 03/04/22 1302     HPI: Martha [name] Soc is a 37 y.o. [id number] at 35w2dwho presents to maternity admissions reporting left abdominal and lower extremity pain. Pregnancy complicated by cHTN, insulin-dependent DMII, late to prenatal care.   Patient reports onset of LLE pain on Saturday. Pain has progressively worsened since onset. She describes pain as sharp, burning pain that radiates down the back of her leg and into the left side of her abdomen to both the lower and upper left quadrants. Pain worse with walking, specifically when stepping onto heel of left foot. She has been taking tylenol with minimal relief of pain, last at 9am this morning. She also reports chronic constipation with current feeling of bloating. Last bowel movement on Friday which she describes as difficult to pass. She typically uses prune juice to ease constipation.    Patient reports that her blood sugars have been running from 190-mid 200s. She has been taking her insulin. She denies vaginal bleeding, LOF, decreased fetal movement, HA, edema, abnormal vaginal discharge, or dysuria.   Pregnancy Course: [facility]  Past Medical History:  Diagnosis Date   Diabetes mellitus type 2, uncontrolled, with complications [id number]  Essential hypertension 03/19/2018   Kidney infection 0105/2017   Severe pre-eclampsia 11/01/2020   OB History  Gravida Para Term Preterm AB Living  6 5 3 2   5  $ SAB IAB Ectopic Multiple Live Births        0 5    # Outcome Date GA Lbr Len/2nd Weight Sex Delivery Anes PTL Lv  6 Current           5 Preterm 11/17/20 [id number] g F CS-LTranv Spinal  LIV     Birth Comments: preterm  4 Preterm 11/21/13 3650w1d:00 / 00:03 3609 g M Vag-Spont Local  LIV     Birth Comments: On phototherapy for jaundice  3 Term 01/28/08  40w17w0d75 g M Vag-Spont None  LIV  2 Term 08/02/03 42w068w0d7 g M Vag-Spont None  LIV  1 Term 11/02/99 38w0d109w0d g F Vag-Spont None  LIV   Past Surgical History:  Procedure Laterality Date   APPENDECTOMY     CESAREAN SECTION N/A 11/17/2020   Procedure: CESAREAN SECTION;  Surgeon: ArnoldWoodroe Mode Location: [facility] LD ORS;  Service: Obstetrics;  Laterality: N/A;   CHOLECYSTECTOMY     Family History  Problem Relation Age of Onset   Diabetes Mother    Hypertension Mother    Heart disease Father    Mental retardation Sister    Social History   Tobacco Use   Smoking status: Never   Smokeless tobacco: Never  Vaping Use   Vaping Use: Never used  Substance Use Topics   Alcohol use: No   Drug use: No   Allergies  Allergen Reactions   Influenza Vaccines Itching and Rash   Medications Prior to Admission  Medication Sig Dispense Refill Last Dose   aspirin 81 MG chewable tablet Chew 1 tablet (81 mg total) by mouth daily. 30 tablet 5 03/03/2022   insulin lispro (HUMALOG) 100 UNIT/ML injection Inject 8 Units into the skin 3 (three) times daily before meals. 10 mL 11 03/04/2022   insulin NPH Human (HUMULIN N) 100 UNIT/ML injection Inject 0.1 mLs (10 Units total)  into the skin 2 (two) times daily before a meal. 10 mL 3 03/04/2022   Insulin Syringe-Needle U-100 30G X 5/16" 0.3 ML MISC Use as directed twice daily. 100 each 0 03/04/2022   Prenatal Vit-Fe Fumarate-FA (PREPLUS) 27-1 MG TABS Take 1 tablet by mouth daily. 30 tablet 8 03/04/2022   acetaminophen (TYLENOL) 500 MG tablet Take 2 tablets (1,000 mg total) by mouth every 6 (six) hours. (Patient not taking: Reported on 02/05/2022) 30 tablet 0    dibucaine (NUPERCAINAL) 1 % OINT Place 1 application rectally as needed for hemorrhoids. (Patient not taking: Reported on 12/31/2020) 28 g 0    furosemide (LASIX) 20 MG tablet Take 1 tablet (20 mg total) by mouth 2 (two) times daily. (Patient not taking: Reported on 12/31/2020) 4 tablet 0     ibuprofen (ADVIL) 600 MG tablet Take 1 tablet (600 mg total) by mouth every 6 (six) hours. (Patient not taking: Reported on 02/04/2022) 30 tablet 0    labetalol (NORMODYNE) 200 MG tablet Take 2 tablets (400 mg total) by mouth 2 (two) times daily. 120 tablet 0    NIFEdipine (ADALAT CC) 60 MG 24 hr tablet Take 1 tablet (60 mg total) by mouth 2 (two) times daily. (Patient not taking: Reported on 02/04/2022) 60 tablet 0    phenylephrine-shark liver oil-mineral oil-petrolatum (PREPARATION H) 0.25-14-74.9 % rectal ointment Place 1 application rectally 2 (two) times daily as needed for hemorrhoids. (Patient not taking: Reported on 12/31/2020) 28 g 1     I have reviewed patient's Past Medical Hx, Surgical Hx, Family Hx, Social Hx, medications and allergies.   ROS:  Pertinent items noted in HPI and remainder of comprehensive ROS otherwise negative.   Physical Exam  Patient Vitals for the past 24 hrs:  BP Temp Temp src Pulse Resp SpO2 Height Weight  03/04/22 1245 138/86 -- -- 78 -- -- -- --  03/04/22 1214 126/73 97.8 F (36.6 C) Oral 94 20 98 % 5' 2"$  (1.575 m) 80.8 kg  03/04/22 1139 (!) 141/104 98.6 F (37 C) -- 77 18 99 % -- --    Constitutional: Well-developed, well-nourished female in mild distress.  Cardiovascular: normal peripheral pulses, warm and well-perfused Respiratory: normal effort, no problems with respiration noted GI: Abd soft, tender in LUQ and LLQ, gravid appropriate for gestational age MS: Extremities nontender to palpation, no edema, posterior RLE pain with dorsiflexion and plantarflexion of foot Neurologic: Alert and oriented x 4.  GU: no CVA tenderness Pelvic: deferred     Fetal Tracing: Baseline: 145 Variability: moderate Accelerations: 10x10 Decelerations: none Toco: irritability    Labs: Results for orders placed or performed during the hospital encounter of 03/04/22 (from the past 24 hour(s))  Urinalysis, Routine w reflex microscopic -Urine, Clean Catch      Status: Abnormal   Collection Time: 03/04/22 12:44 PM  Result Value Ref Range   Color, Urine YELLOW YELLOW   APPearance HAZY (A) CLEAR   Specific Gravity, Urine 1.007 1.005 - 1.030   pH 7.0 5.0 - 8.0   Glucose, UA >=500 (A) NEGATIVE mg/dL   Hgb urine dipstick NEGATIVE NEGATIVE   Bilirubin Urine NEGATIVE NEGATIVE   Ketones, ur NEGATIVE NEGATIVE mg/dL   Protein, ur 30 (A) NEGATIVE mg/dL   Nitrite NEGATIVE NEGATIVE   Leukocytes,Ua TRACE (A) NEGATIVE   RBC / HPF 0-5 0 - 5 RBC/hpf   WBC, UA 6-10 0 - 5 WBC/hpf   Bacteria, UA MANY (A) NONE SEEN   Squamous Epithelial / HPF 6-10  0 - 5 /HPF   Mucus PRESENT   CBC with Differential/Platelet     Status: Abnormal   Collection Time: 03/04/22  1:29 PM  Result Value Ref Range   WBC 6.9 4.0 - 10.5 K/uL   RBC 3.94 3.87 - 5.11 MIL/uL   Hemoglobin 12.3 12.0 - 15.0 g/dL   HCT 33.6 (L) 36.0 - 46.0 %   MCV 85.3 80.0 - 100.0 fL   MCH 31.2 26.0 - 34.0 pg   MCHC 36.6 (H) 30.0 - 36.0 g/dL   RDW 12.3 11.5 - 15.5 %   Platelets 351 150 - 400 K/uL   nRBC 0.0 0.0 - 0.2 %   Neutrophils Relative % 63 %   Neutro Abs 4.3 1.7 - 7.7 K/uL   Lymphocytes Relative 20 %   Lymphs Abs 1.4 0.7 - 4.0 K/uL   Monocytes Relative 6 %   Monocytes Absolute 0.4 0.1 - 1.0 K/uL   Eosinophils Relative 10 %   Eosinophils Absolute 0.7 (H) 0.0 - 0.5 K/uL   Basophils Relative 0 %   Basophils Absolute 0.0 0.0 - 0.1 K/uL   Immature Granulocytes 1 %   Abs Immature Granulocytes 0.05 0.00 - 0.07 K/uL  Comprehensive metabolic panel     Status: Abnormal   Collection Time: 03/04/22  1:29 PM  Result Value Ref Range   Sodium 132 (L) 135 - 145 mmol/L   Potassium 3.7 3.5 - 5.1 mmol/L   Chloride 102 98 - 111 mmol/L   CO2 18 (L) 22 - 32 mmol/L   Glucose, Bld 263 (H) 70 - 99 mg/dL   BUN 8 6 - 20 mg/dL   Creatinine, Ser 0.50 0.44 - 1.00 mg/dL   Calcium 8.3 (L) 8.9 - 10.3 mg/dL   Total Protein 6.2 (L) 6.5 - 8.1 g/dL   Albumin 2.4 (L) 3.5 - 5.0 g/dL   AST 24 15 - 41 U/L   ALT 17 0 - 44  U/L   Alkaline Phosphatase 62 38 - 126 U/L   Total Bilirubin <0.1 (L) 0.3 - 1.2 mg/dL   GFR, Estimated >60 >60 mL/min   Anion gap 12 5 - 15    Imaging:  No results found.  MAU Course: Orders Placed This Encounter  Procedures   Culture, OB Urine   Urinalysis, Routine w reflex microscopic -Urine, Clean Catch   CBC with Differential/Platelet   Comprehensive metabolic panel   Ambulatory referral to Physical Therapy   Discharge patient   Meds ordered this encounter  Medications   DISCONTD: cyclobenzaprine (FLEXERIL) tablet 10 mg   sorbitol, milk of mag, mineral oil, glycerin (SMOG) enema   cyclobenzaprine (FLEXERIL) tablet 5 mg   Magnesium Oxide -Mg Supplement (MAG-OXIDE) 200 MG TABS    Sig: Take 2 tablets (400 mg total) by mouth at bedtime. If that amount causes loose stools in the am, switch to 278m daily at bedtime.    Dispense:  60 tablet    Refill:  3    Order Specific Question:   Supervising Provider    Answer:   PDonnamae Jude[[id number]]   cyclobenzaprine (FLEXERIL) 10 MG tablet    Sig: Take 1 tablet (10 mg total) by mouth every 8 (eight) hours as needed for muscle spasms.    Dispense:  30 tablet    Refill:  1    Order Specific Question:   Supervising Provider    Answer:   PMerrily Pew  Medical screening exam performed. UA, CBC, CMP ordered.  SMOG enema ordered for constipation; 25m flexeril ordered for pain.  Patient received SMOG enema and flexeril. She had two subsequent bowel movements.  Pain improved following bowel movement. Discussed importance of adequate dietary fiber to help with constipation. Discussed that LLE pain is most likely sciatica. Will refer to PT and provide with flexeril prn. She is also provided with Rx for magnesium oxide to be taken nightly which may help with constipation and muscle pain. Demonstrated stretches that she can try at home for sciatica.  Additionally, UA with many bacteria, trace LE; patient denies dysuria. Will send for  culture and defer consideration of antibiotics until results.  MDM: Moderate  Assessment: 1. Constipation during pregnancy in second trimester   2. Supervision of high risk pregnancy, antepartum   3. Sciatica of left side   4. NST (non-stress test) reactive   5. [redacted] weeks gestation of pregnancy     Plan: Discharge home in stable condition with second trimester precautions.     [facility].   Specialty: Obstetrics and Gynecology Why: as scheduled for ongoing prenatal care Contact information: [phone]               Allergies as of 03/04/2022       Reactions   Influenza Vaccines Itching, Rash        Medication List     STOP taking these medications    Acetaminophen Extra Strength 500 MG Tabs   dibucaine 1 % Oint Commonly known as: NUPERCAINAL   furosemide 20 MG tablet Commonly known as: LASIX   ibuprofen 600 MG tablet Commonly known as: ADVIL   labetalol 200 MG tablet Commonly known as: NORMODYNE   NIFEdipine 60 MG 24 hr tablet Commonly known as: ADALAT CC   phenylephrine-shark liver oil-mineral oil-petrolatum 0.25-14-74.9 % rectal ointment Commonly known as: PREPARATION H       TAKE these medications    aspirin 81 MG chewable tablet Chew 1 tablet (81 mg total) by mouth daily.   cyclobenzaprine 10 MG tablet Commonly known as: FLEXERIL Take 1 tablet (10 mg total) by mouth every 8 (eight) hours as needed for muscle spasms.   HumaLOG 100 UNIT/ML injection Generic drug: insulin lispro Inject 8 Units into the skin 3 (three) times daily before meals.   HumuLIN N 100 UNIT/ML injection Generic drug: insulin NPH Human Inject 0.1 mLs (10 Units total) into the skin 2 (two) times daily before a meal.   Mag-Oxide 200 MG Tabs Generic drug: Magnesium Oxide -Mg Supplement Take 2 tablets (400 mg total) by  mouth at bedtime. If that amount causes loose stools in the am, switch to 2028mdaily at bedtime.   PrePLUS 27-1 MG Tabs Take 1 tablet by mouth daily.   TRUEplus Insulin Syringe 30G X 5/16" 0.3 ML Misc Generic drug: Insulin Syringe-Needle U-100 Use as directed twice daily.       LyDarlen RoundMD PGY-1  Attestation of Supervision of Student:  I confirm that I have verified the information documented in the medical student's note and that I have also personally  supervised  the history, physical exam and all medical decision making activities.  I have verified that all services and findings are accurately documented in this student's note; and I agree with management and plan as outlined in the documentation. I have also made any necessary editorial changes.  JaSamella Parr  Olevia Bowens, [place] for [employer], [employer] 03/04/2022 3:31 PM

## 2022-03-04 NOTE — ED Provider Triage Note (Signed)
Emergency Medicine Provider OB Triage Evaluation Note  Martha [name] Soc is a 37 y.o. female, [id number], at 22w2dgestation who presents to the emergency department with complaints of abd pain and leg. Pain starts in med back-radiates down the back left leg. She feels that it radiates into her abdomen.  She has not made a bowel movement in a few days.  She feels like her leg is also weak at times.  Review of  Systems  Positive: back , leg /abd pain  Negative: vomiting  Physical Exam  BP (!) 141/104 (BP Location: Right Arm)   Pulse 77   Temp 98.6 F (37 C)   Resp 18   LMP 11/16/2021   SpO2 99%  General: Awake, no distress  HEENT: Atraumatic  Resp: Normal effort  Cardiac: Normal rate Abd: Nondistended, nontender  MSK: Moves all extremities without difficulty Neuro: Speech clear  Medical Decision Making  Pt evaluated for pregnancy concern and is stable for transfer to MAU. Pt is in agreement with plan for transfer.  11:40 AM Discussed with MAU  provider Dr. KLauretta Chester who accepts patient in transfer.  Clinical Impression  No diagnosis found.     HMargarita Mail PA-C 03/04/22 1146

## 2022-03-04 NOTE — MAU Note (Addendum)
...  Martha [name] Soc is a 37 y.o. at [redacted]w[redacted]d here in MAU reporting: This past Saturday she began feeling left sided back, hip, and leg pain. She reports walking and movement increases her pain but standing still decreases her pain. Last BM this past Friday but reports it was hard. She reports she is constipated. She reports after having bowel movements she experiences rectal bleeding. Denies VB or LOF.   Has hx of hemorrhoids.  Next OB appt tomorrow. Last took two 650 mg of Tylenol two hours ago.   Onset of complaint: This past Saturday  Pain score:  9/10 left lower back 8/10 left lower abdomen 10/10 left leg  FHT: 145 doppler Lab orders placed from triage: UA

## 2022-03-04 NOTE — ED Triage Notes (Signed)
Pt arrived POV from home c/o pain that starts in her back and runs down her left leg and into her abdomen. Pt states she is 6 months pregnant.

## 2022-03-05 ENCOUNTER — Other Ambulatory Visit: Payer: Self-pay

## 2022-03-05 ENCOUNTER — Encounter: Payer: Self-pay | Admitting: Obstetrics & Gynecology

## 2022-03-05 ENCOUNTER — Ambulatory Visit (INDEPENDENT_AMBULATORY_CARE_PROVIDER_SITE_OTHER): Payer: Self-pay | Admitting: Obstetrics & Gynecology

## 2022-03-05 DIAGNOSIS — O24912 Unspecified diabetes mellitus in pregnancy, second trimester: Secondary | ICD-10-CM

## 2022-03-05 DIAGNOSIS — Z3A23 23 weeks gestation of pregnancy: Secondary | ICD-10-CM

## 2022-03-05 DIAGNOSIS — O24919 Unspecified diabetes mellitus in pregnancy, unspecified trimester: Secondary | ICD-10-CM

## 2022-03-05 MED ORDER — INSULIN LISPRO 100 UNIT/ML IJ SOLN: 8.0000 [IU] | Freq: Three times a day (TID) | INTRAMUSCULAR | 11 refills | Status: DC

## 2022-03-05 MED ORDER — INSULIN NPH (HUMAN) (ISOPHANE) 100 UNIT/ML ~~LOC~~ SUSP: 20.0000 [IU] | Freq: Two times a day (BID) | SUBCUTANEOUS | 3 refills | Status: DC

## 2022-03-05 MED ORDER — IBUPROFEN 600 MG PO TABS
600.0000 mg | ORAL_TABLET | Freq: Three times a day (TID) | ORAL | 1 refills | Status: DC | PRN
  Filled 2022-03-05: qty 30, 10d supply, fill #0

## 2022-03-05 NOTE — Progress Notes (Signed)
   PRENATAL VISIT NOTE  Subjective:  Martha [name] is a 37 y.o. [phone] at [redacted]w[redacted]d being seen today for ongoing prenatal care.  She is currently monitored for the following issues for this high-risk pregnancy and has Diabetes mellitus affecting pregnancy, antepartum; Language barrier; Obesity; History of depression; Diabetes mellitus type 2, uncontrolled, with complications; Essential hypertension; Obesity affecting pregnancy, antepartum; Positive for macroalbuminuria; Chronic hypertension affecting pregnancy; Supervision of high risk pregnancy, antepartum; History of cesarean delivery; and Advanced maternal age in multigravida, second trimester on their problem list.  Patient reports backache and sciatica .  Contractions: Not present. Vag. Bleeding: None.  Movement: Present. Denies leaking of fluid.   The following portions of the patient's history were reviewed and updated as appropriate: allergies, current medications, past family history, past medical history, past social history, past surgical history and problem list.   Objective:   Vitals:   03/05/22 1544  BP: 115/83  Pulse: 78  Weight: 80.4 kg    Fetal Status: Fetal Heart Rate (bpm): 150   Movement: Present     General:  Alert, oriented and cooperative. Patient is in no acute distress.  Skin: Skin is warm and dry. No rash noted.   Cardiovascular: Normal heart rate noted  Respiratory: Normal respiratory effort, no problems with respiration noted  Abdomen: Soft, gravid, appropriate for gestational age.  Pain/Pressure: Present     Pelvic: Cervical exam deferred        Extremities: Normal range of motion.  Edema: None  Mental Status: Normal mood and affect. Normal behavior. Normal judgment and thought content.   Assessment and Plan:  Pregnancy: [id number] at [redacted]w[redacted]d 1. Diabetes mellitus affecting pregnancy, antepartum BG > 225 up to 400, f/u with Marshell Garfinkel tomorrow - insulin NPH Human (HUMULIN N) 100 UNIT/ML injection; Inject  0.2 mLs (20 Units total) into the skin 2 (two) times daily before a meal.  Dispense: 10 mL; Refill: 3 - insulin lispro (HUMALOG) 100 UNIT/ML injection; Inject 8 Units into the skin 3 (three) times daily before meals.  Dispense: 10 mL; Refill: 11 Sciatic pain. She has PT appointment - ibuprofen (ADVIL) 600 MG tablet; Take 1 tablet (600 mg total) by mouth every 8 (eight) hours as needed.  Dispense: 30 tablet; Refill: 1  Preterm labor symptoms and general obstetric precautions including but not limited to vaginal bleeding, contractions, leaking of fluid and fetal movement were reviewed in detail with the patient. Please refer to After Visit Summary for other counseling recommendations.   Return in about 1 week (around 03/12/2022).  Future Appointments  Date Time Provider [place]  03/06/2022 11:15 AM [facility]  03/19/2022 11:15 AM Caren Macadam, MD [facility]  03/24/2022 11:15 AM [facility] NURSE [facility]  03/24/2022 11:30 AM [facility] US2 [facility]  04/01/2022 11:15 AM Clarnce Flock, MD [facility]  04/08/2022 11:45 AM Marijo Sanes, PT [facility]  04/15/2022 11:15 AM Clarnce Flock, MD [facility]  04/21/2022 11:15 AM [facility] NURSE [facility]  04/21/2022 11:30 AM [facility] US3 [facility]  05/05/2022 11:15 AM [facility] NURSE [facility]  05/05/2022 11:30 AM [facility] US3 [facility]  05/12/2022 11:15 AM [facility] NURSE [facility]  05/12/2022 11:30 AM [facility] US3 [facility]  05/19/2022 11:15 AM [facility] NURSE [facility]  05/19/2022 11:30 AM [facility] US3 [facility]    Emeterio Reeve, MD

## 2022-03-06 ENCOUNTER — Ambulatory Visit (INDEPENDENT_AMBULATORY_CARE_PROVIDER_SITE_OTHER): Payer: Self-pay | Admitting: Registered"

## 2022-03-06 ENCOUNTER — Other Ambulatory Visit: Payer: Self-pay

## 2022-03-06 ENCOUNTER — Encounter: Payer: Self-pay | Attending: Certified Nurse Midwife | Admitting: Registered"

## 2022-03-06 DIAGNOSIS — Z3A23 23 weeks gestation of pregnancy: Secondary | ICD-10-CM

## 2022-03-06 DIAGNOSIS — O24919 Unspecified diabetes mellitus in pregnancy, unspecified trimester: Secondary | ICD-10-CM

## 2022-03-06 LAB — CULTURE, OB URINE
Culture: >=100000 — AB
Special Requests: NORMAL

## 2022-03-06 NOTE — Progress Notes (Signed)
Patient was seen for Type 2 Diabetes in pregnancy self-management on 03/05/22  Start time 1120 and End time 1145   Estimated due date: 06/29/2022; [redacted]w[redacted]d Next MD visit 03/12/22  [employer] video Interpreter CJenny Reichmann#[phone] dropped, reconnected IDevra Dopp#[id number] Clinical: Medications: NPH 20 bid; Humalog 10 tid with meals, aspirin Medical History: T2D Labs: OGTT n/a, A1c 10.7% on 02/05/2022  Assessement Pt states when she went to the hospital recently after not having a BM for 3 days she was told to eat vegetables and don't drink milk due to constipation.  Pt states she is only drinking water now to help blood sugar and last night started using increased dose of insulin.  Pt wanted clarification about when to use the regular and NPH insulin. Pt repeated back taking Humalog ~15 min before eating meals and NPH with breakfast and dinner.   24 hr Recall:  Breakfast: 1 egg, little bit of beans, 1 slide bread whole wheat, 1 bottle of water  Lunch: scrambled eggs, green beans,  Dinner: boiled 1/4 potato, cheese, 2 tortillas, chayote Beverages: "only water"  Blood glucose monitor given: [employer] Lot # [id number] lot# [id number] Exp: 2023-05-25 CBG: 393 mg/dL; verified with second check 371 mg/dL Pt states 30 min ago she ate 2 tortillas, beef and beans, only drank water  Patient instructed to monitor glucose levels: FBS: 70 - ? 95 mg/dL; 2 hour: ? 120 mg/dL  Patient received handouts: Nutrition Diabetes and Pregnancy Carbohydrate Counting List  Patient will be seen for follow-up as needed.

## 2022-03-06 NOTE — Progress Notes (Unsigned)
Alert received through NCNotify system as follows:   Alert Note: Patient identified as having high BP reading (145/81) on 02/24/2022  Chart review: Blood pressure was rechecked on 03/04/22 BP was 138/86 & 126/80. On 03/05/22 BP WAS 115/83.  No action needed.  Bethanne Ginger, [place] 03/06/2022  2:55 PM

## 2022-03-08 ENCOUNTER — Telehealth: Payer: Self-pay | Admitting: Certified Nurse Midwife

## 2022-03-08 DIAGNOSIS — O2342 Unspecified infection of urinary tract in pregnancy, second trimester: Secondary | ICD-10-CM

## 2022-03-08 MED ORDER — CEFADROXIL 500 MG PO CAPS: 500.0000 mg | ORAL_CAPSULE | Freq: Two times a day (BID) | ORAL | 0 refills | Status: DC

## 2022-03-08 NOTE — Telephone Encounter (Signed)
Pt informed of UTI. Recommend more water intake. Rx sent for [place]. All questions answered.  In-house Spanish interpreter used for encounter.

## 2022-03-12 ENCOUNTER — Other Ambulatory Visit: Payer: Self-pay

## 2022-03-12 ENCOUNTER — Ambulatory Visit (INDEPENDENT_AMBULATORY_CARE_PROVIDER_SITE_OTHER): Payer: Self-pay | Admitting: Obstetrics and Gynecology

## 2022-03-12 ENCOUNTER — Encounter: Payer: Self-pay | Admitting: Obstetrics and Gynecology

## 2022-03-12 VITALS — BP 135/84 | HR 111 | Wt 175.5 lb

## 2022-03-12 DIAGNOSIS — O99212 Obesity complicating pregnancy, second trimester: Secondary | ICD-10-CM

## 2022-03-12 DIAGNOSIS — Z789 Other specified health status: Secondary | ICD-10-CM

## 2022-03-12 DIAGNOSIS — O2340 Unspecified infection of urinary tract in pregnancy, unspecified trimester: Secondary | ICD-10-CM | POA: Insufficient documentation

## 2022-03-12 DIAGNOSIS — O10912 Unspecified pre-existing hypertension complicating pregnancy, second trimester: Secondary | ICD-10-CM

## 2022-03-12 DIAGNOSIS — Z3A24 24 weeks gestation of pregnancy: Secondary | ICD-10-CM

## 2022-03-12 DIAGNOSIS — O10919 Unspecified pre-existing hypertension complicating pregnancy, unspecified trimester: Secondary | ICD-10-CM

## 2022-03-12 DIAGNOSIS — Z603 Acculturation difficulty: Secondary | ICD-10-CM

## 2022-03-12 DIAGNOSIS — O24912 Unspecified diabetes mellitus in pregnancy, second trimester: Secondary | ICD-10-CM

## 2022-03-12 DIAGNOSIS — O09522 Supervision of elderly multigravida, second trimester: Secondary | ICD-10-CM

## 2022-03-12 DIAGNOSIS — O24919 Unspecified diabetes mellitus in pregnancy, unspecified trimester: Secondary | ICD-10-CM

## 2022-03-12 DIAGNOSIS — Z8759 Personal history of other complications of pregnancy, childbirth and the puerperium: Secondary | ICD-10-CM

## 2022-03-12 DIAGNOSIS — Z98891 History of uterine scar from previous surgery: Secondary | ICD-10-CM

## 2022-03-12 DIAGNOSIS — O2342 Unspecified infection of urinary tract in pregnancy, second trimester: Secondary | ICD-10-CM

## 2022-03-12 DIAGNOSIS — Z6832 Body mass index (BMI) 32.0-32.9, adult: Secondary | ICD-10-CM

## 2022-03-12 DIAGNOSIS — M5432 Sciatica, left side: Secondary | ICD-10-CM

## 2022-03-12 DIAGNOSIS — O99210 Obesity complicating pregnancy, unspecified trimester: Secondary | ICD-10-CM

## 2022-03-12 MED ORDER — FOSFOMYCIN TROMETHAMINE 3 G PO PACK
3.0000 g | PACK | Freq: Once | ORAL | 0 refills | Status: AC
  Filled 2022-03-12: qty 3, 1d supply, fill #0

## 2022-03-12 MED ORDER — INSULIN NPH (HUMAN) (ISOPHANE) 100 UNIT/ML ~~LOC~~ SUSP: 25.0000 [IU] | Freq: Two times a day (BID) | SUBCUTANEOUS | 3 refills | Status: DC

## 2022-03-12 MED ORDER — INSULIN LISPRO 100 UNIT/ML IJ SOLN: 12.0000 [IU] | Freq: Three times a day (TID) | INTRAMUSCULAR | 11 refills | Status: DC

## 2022-03-12 NOTE — Progress Notes (Unsigned)
PRENATAL VISIT NOTE  Subjective:  Martha [name] is a 37 y.o. [phone] at 61w3dbeing seen today for ongoing prenatal care.  She is currently monitored for the following issues for this high-risk pregnancy and has Diabetes mellitus affecting pregnancy, antepartum; Language barrier; BMI 32.0-32.9,adult; History of depression; Diabetes mellitus type 2, uncontrolled, with complications; Essential hypertension; Obesity affecting pregnancy, antepartum; Positive for macroalbuminuria; Chronic hypertension affecting pregnancy; Supervision of high risk pregnancy, antepartum; History of cesarean delivery; Advanced maternal age in multigravida, second trimester; and UTI in pregnancy on their problem list.  Patient reports  left sciatic pain .  Contractions: Irritability. Vag. Bleeding: None.  Movement: Present. Denies leaking of fluid.   The following portions of the patient's history were reviewed and updated as appropriate: allergies, current medications, past family history, past medical history, past social history, past surgical history and problem list.   Objective:   Vitals:   03/12/22 1501  BP: 135/84  Pulse: (!) 111  Weight: 175 lb 8 oz (79.6 kg)    Fetal Status: Fetal Heart Rate (bpm): 154   Movement: Present     General:  Alert, oriented and cooperative. Patient is in no acute distress.  Skin: Skin is warm and dry. No rash noted.   Cardiovascular: Normal heart rate noted  Respiratory: Normal respiratory effort, no problems with respiration noted  Abdomen: Soft, gravid, appropriate for gestational age.  Pain/Pressure: Present     Pelvic: Cervical exam deferred        Extremities: Normal range of motion.  Edema: None. No e/o DVT in left leg. Non concerning varicose veins  Mental Status: Normal mood and affect. Normal behavior. Normal judgment and thought content.   Assessment and Plan:  Pregnancy: [id number] 290w3d. [redacted] weeks gestation of pregnancy BTL with c-section; pt is self  pay - Ambulatory referral to Physical Therapy  2. Chronic hypertension affecting pregnancy Doing well on no meds  3. Diabetes mellitus affecting pregnancy, antepartum Patient on nph 20/20 and novolog 10 tidac. AM fasting in the 180s and 2 hour post prandials in the 160s. Increase to NPH 25/25 and novolog 25 tidac.  Follow up fetal echo for 3/1 2/15: 31%, 459g, ac 64%, afi wnl.  Continue serial growth u/s. Start qwk testing at 32wks - insulin NPH Human (HUMULIN N) 100 UNIT/ML injection; Inject 0.25 mLs (25 Units total) into the skin 2 (two) times daily before a meal.  Dispense: 10 mL; Refill: 3 - insulin lispro (HUMALOG) 100 UNIT/ML injection; Inject 0.12 mLs (12 Units total) into the skin 3 (three) times daily before meals.  Dispense: 10 mL; Refill: 11  4. Left sciatic nerve pain Has [facility] PT appt. Will refer to PT here to see if can get earlier visit Will give pregnancy belt and d/w her re: pregnancy massage and youtube exercises - Ambulatory referral to Physical Therapy  5. Urinary tract infection in mother during second trimester of pregnancy Duricef made her feel off so she only took 2-3 doses. Will try fosfomycin  6. Obesity affecting pregnancy, antepartum, unspecified obesity type  7. BMI 32.0-32.9,adult  8. History of cesarean delivery Desires rpt and btl  9. Advanced maternal age in multigravida, second trimester No current issues; low risk panorama.   10. Language barrier Interpreter used  11. History of severe pre-eclampsia 2022 32wk PLTCS for NRFHT, severe pre-x.   Preterm labor symptoms and general obstetric precautions including but not limited to vaginal bleeding, contractions, leaking of fluid and fetal movement were  reviewed in detail with the patient. Please refer to After Visit Summary for other counseling recommendations.   Return in about 1 week (around 03/19/2022) for in person, high risk ob, md visit.  Future Appointments  Date Time Provider [place]  03/19/2022 11:15 AM Caren Macadam, MD [facility]  03/24/2022 11:15 AM [facility] NURSE [facility]  03/24/2022 11:30 AM [facility] US2 [facility]  04/01/2022 11:15 AM Clarnce Flock, MD [facility]  04/08/2022 11:45 AM Marijo Sanes, PT [facility]  04/15/2022 11:15 AM Clarnce Flock, MD [facility]  04/21/2022 11:15 AM [facility] NURSE [facility]  04/21/2022 11:30 AM [facility] US3 [facility]  05/05/2022 11:15 AM [facility] NURSE [facility]  05/05/2022 11:30 AM [facility] US3 [facility]  05/12/2022 11:15 AM [facility] NURSE [facility]  05/12/2022 11:30 AM [facility] US3 [facility]  05/19/2022 11:15 AM [facility] NURSE [facility]  05/19/2022 11:30 AM [facility] US3 [facility]    Aletha Halim, MD  ***

## 2022-03-12 NOTE — Progress Notes (Unsigned)
Pt reorts that she's still having a lot of pain in her left leg, issues with veins.Pain in left lower buttock area radiates down to foot.

## 2022-03-18 ENCOUNTER — Other Ambulatory Visit: Payer: Self-pay

## 2022-03-19 ENCOUNTER — Telehealth: Payer: Self-pay | Admitting: Family Medicine

## 2022-03-19 ENCOUNTER — Encounter: Payer: Self-pay | Admitting: Family Medicine

## 2022-03-19 ENCOUNTER — Ambulatory Visit (INDEPENDENT_AMBULATORY_CARE_PROVIDER_SITE_OTHER): Payer: Self-pay | Admitting: Family Medicine

## 2022-03-19 VITALS — BP 127/85 | HR 112 | Wt 176.2 lb

## 2022-03-19 DIAGNOSIS — O10919 Unspecified pre-existing hypertension complicating pregnancy, unspecified trimester: Secondary | ICD-10-CM

## 2022-03-19 DIAGNOSIS — E119 Type 2 diabetes mellitus without complications: Secondary | ICD-10-CM

## 2022-03-19 DIAGNOSIS — Z98891 History of uterine scar from previous surgery: Secondary | ICD-10-CM

## 2022-03-19 DIAGNOSIS — O0990 Supervision of high risk pregnancy, unspecified, unspecified trimester: Secondary | ICD-10-CM

## 2022-03-19 DIAGNOSIS — O24919 Unspecified diabetes mellitus in pregnancy, unspecified trimester: Secondary | ICD-10-CM

## 2022-03-19 MED ORDER — INSULIN LISPRO 100 UNIT/ML IJ SOLN: 35.0000 [IU] | Freq: Three times a day (TID) | INTRAMUSCULAR | 11 refills | Status: DC

## 2022-03-19 MED ORDER — INSULIN NPH (HUMAN) (ISOPHANE) 100 UNIT/ML ~~LOC~~ SUSP: 35.0000 [IU] | Freq: Two times a day (BID) | SUBCUTANEOUS | 3 refills | Status: DC

## 2022-03-19 NOTE — Progress Notes (Signed)
PRENATAL VISIT NOTE  Subjective:  Martha [name] is a 37 y.o. [phone] at 93w3dbeing seen today for ongoing prenatal care.  She is currently monitored for the following issues for this high-risk pregnancy and has Diabetes mellitus affecting pregnancy, antepartum; Language barrier; BMI 32.0-32.9,adult; History of depression; Diabetes mellitus type 2, uncontrolled, with complications; Essential hypertension; Obesity affecting pregnancy, antepartum; Positive for macroalbuminuria; Chronic hypertension affecting pregnancy; Supervision of high risk pregnancy, antepartum; History of cesarean delivery; Advanced maternal age in multigravida, second trimester; UTI in pregnancy; and History of severe pre-eclampsia on their problem list.  Patient reports no complaints.  Contractions: Not present. Vag. Bleeding: None.  Movement: Present. Denies leaking of fluid.   The following portions of the patient's history were reviewed and updated as appropriate: allergies, current medications, past family history, past medical history, past social history, past surgical history and problem list.   Objective:   Vitals:   03/19/22 1347  BP: 127/85  Pulse: (!) 112  Weight: 176 lb 3.2 oz (79.9 kg)    Fetal Status: Fetal Heart Rate (bpm): 159   Movement: Present     General:  Alert, oriented and cooperative. Patient is in no acute distress.  Skin: Skin is warm and dry. No rash noted.   Cardiovascular: Normal heart rate noted  Respiratory: Normal respiratory effort, no problems with respiration noted  Abdomen: Soft, gravid, appropriate for gestational age.  Pain/Pressure: Present     Pelvic: Cervical exam deferred        Extremities: Normal range of motion.  Edema: None  Mental Status: Normal mood and affect. Normal behavior. Normal judgment and thought content.   Assessment and Plan:  Pregnancy: [id number] 261w3d. Chronic hypertension affecting pregnancy BP WNL  2. Diabetes mellitus affecting  pregnancy, antepartum Last week insulin was increased on 2/21= NPH 25/25 and Novolog 25 TIDAC. Has fetal echo scheduled on 3/1 at 12:30 -- patient was unaware of this visit and we clarified that she has an appt on 3/1 and 3/4.  3/1 is the fetal echo and 3/4 was for growth/MFM follow up  Reports since new dose-- virtually NO change in her sugars.  Fasting 180s 2hr PP 160-170s  Today we are increasing her NPH to 35 BID and Novolog 35 TID AC Patient voiced understanding Repeat visit in 1 week for BG log check Emphasized the importance of brining log.   3. History of cesarean delivery Planning on RCS  4. Supervision of high risk pregnancy, antepartum Last seen 2/21 Here today just for blood sugar management  Preterm labor symptoms and general obstetric precautions including but not limited to vaginal bleeding, contractions, leaking of fluid and fetal movement were reviewed in detail with the patient. Please refer to After Visit Summary for other counseling recommendations.   Return in about 1 week (around 03/26/2022) for Blood sugar check with AnLevada Dy  Future Appointments  Date Time Provider [place]3/04/2022 11:15 AM [facility]SE [facility]/04/2022 11:30 AM [facility]/12/2022 11:15 AM EcClarnce FlockMD [facility]/19/2024 11:45 AM HaMarijo SanesPT [facility]  04/15/2022 11:15 AM EcClarnce FlockMD [facility]4/01/2022 11:15 AM [facility] NURSE [facility]/01/2022 11:30 AM [facility]4/15/2024 11:15 AM [facility] NURSE [facility]4/15/2024 11:30 AM [facility] US3 [facility]4/22/2024 11:15 AM [facility] NURSE [facility]4/22/2024 11:30 AM [facility] US3 [facility]4/29/2024 11:15  AM [facility] NURSE [facility]  05/19/2022 11:30 AM [facility] US3 WMC-MFCUS [place]    Caren Macadam, MD

## 2022-03-19 NOTE — Patient Instructions (Addendum)
Dr Filbert Schilder  Address: [id number], [place], [place] [address]  Fetal Echo appt 3/1 at 1230pm

## 2022-03-19 NOTE — Telephone Encounter (Signed)
Called patient with the help of Eda, there was no answer to the phone call so a voicemail was left with the call back number for the office.

## 2022-03-24 ENCOUNTER — Ambulatory Visit: Payer: Self-pay | Admitting: *Deleted

## 2022-03-24 ENCOUNTER — Ambulatory Visit: Payer: Self-pay | Attending: Maternal & Fetal Medicine

## 2022-03-24 VITALS — BP 115/71 | HR 102

## 2022-03-24 DIAGNOSIS — E119 Type 2 diabetes mellitus without complications: Secondary | ICD-10-CM

## 2022-03-24 DIAGNOSIS — O24112 Pre-existing diabetes mellitus, type 2, in pregnancy, second trimester: Secondary | ICD-10-CM

## 2022-03-24 DIAGNOSIS — O09212 Supervision of pregnancy with history of pre-term labor, second trimester: Secondary | ICD-10-CM

## 2022-03-24 DIAGNOSIS — O10912 Unspecified pre-existing hypertension complicating pregnancy, second trimester: Secondary | ICD-10-CM | POA: Insufficient documentation

## 2022-03-24 DIAGNOSIS — E669 Obesity, unspecified: Secondary | ICD-10-CM

## 2022-03-24 DIAGNOSIS — O34219 Maternal care for unspecified type scar from previous cesarean delivery: Secondary | ICD-10-CM

## 2022-03-24 DIAGNOSIS — O09899 Supervision of other high risk pregnancies, unspecified trimester: Secondary | ICD-10-CM | POA: Insufficient documentation

## 2022-03-24 DIAGNOSIS — O10012 Pre-existing essential hypertension complicating pregnancy, second trimester: Secondary | ICD-10-CM

## 2022-03-24 DIAGNOSIS — Z3A26 26 weeks gestation of pregnancy: Secondary | ICD-10-CM

## 2022-03-24 DIAGNOSIS — O0990 Supervision of high risk pregnancy, unspecified, unspecified trimester: Secondary | ICD-10-CM

## 2022-03-24 DIAGNOSIS — O99212 Obesity complicating pregnancy, second trimester: Secondary | ICD-10-CM

## 2022-03-27 ENCOUNTER — Other Ambulatory Visit: Payer: Self-pay | Admitting: Family Medicine

## 2022-03-27 ENCOUNTER — Ambulatory Visit (INDEPENDENT_AMBULATORY_CARE_PROVIDER_SITE_OTHER): Payer: Self-pay | Admitting: Registered"

## 2022-03-27 ENCOUNTER — Encounter: Payer: Self-pay | Attending: Certified Nurse Midwife | Admitting: Registered"

## 2022-03-27 ENCOUNTER — Other Ambulatory Visit: Payer: Self-pay

## 2022-03-27 DIAGNOSIS — O24919 Unspecified diabetes mellitus in pregnancy, unspecified trimester: Secondary | ICD-10-CM

## 2022-03-27 DIAGNOSIS — O24912 Unspecified diabetes mellitus in pregnancy, second trimester: Secondary | ICD-10-CM

## 2022-03-27 DIAGNOSIS — Z3A26 26 weeks gestation of pregnancy: Secondary | ICD-10-CM

## 2022-03-27 NOTE — Progress Notes (Signed)
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   Pt taking NPH 3x/day with meals and Novolog bid.   Pt states she understands the correct times to take her insulin and asked me to write down on her log sheet so she won't forget.  FBS have increased significantly over the last week d/t recently started waking up hungry in the middle of the night and eating cereal with milk.  Pt reports having blurry vision since Friday evening and was concerned that insulin shots were causing it. Discussed high BG can cause blurry vision.  24 hr Recall:   Pt reports meals are about the same as last visit (below) and still only drinking water. Only change is bedtime snack and waking up during the night and eating cereal  Breakfast: 1 egg, little bit of beans, 1 slide bread whole wheat, 1 bottle of water  Lunch: scrambled eggs, green beans,  Dinner: boiled 1/4 potato, cheese, 2 tortillas, chayote Middle of night snack: cereal and milk Beverages: "only water"  Patient instructed to monitor glucose levels: FBS: 70 - ? 95 mg/dL; 2 hour: ? 120 mg/dL  Patient received handouts:blood sugar log  Patient will be seen for follow-up as needed.

## 2022-04-01 ENCOUNTER — Encounter: Payer: Self-pay | Admitting: Family Medicine

## 2022-04-01 ENCOUNTER — Ambulatory Visit (INDEPENDENT_AMBULATORY_CARE_PROVIDER_SITE_OTHER): Payer: Self-pay | Admitting: Family Medicine

## 2022-04-01 ENCOUNTER — Other Ambulatory Visit: Payer: Self-pay

## 2022-04-01 VITALS — BP 126/86 | HR 93 | Wt 173.4 lb

## 2022-04-01 DIAGNOSIS — O99210 Obesity complicating pregnancy, unspecified trimester: Secondary | ICD-10-CM

## 2022-04-01 DIAGNOSIS — Z8759 Personal history of other complications of pregnancy, childbirth and the puerperium: Secondary | ICD-10-CM

## 2022-04-01 DIAGNOSIS — O09522 Supervision of elderly multigravida, second trimester: Secondary | ICD-10-CM

## 2022-04-01 DIAGNOSIS — O2342 Unspecified infection of urinary tract in pregnancy, second trimester: Secondary | ICD-10-CM

## 2022-04-01 DIAGNOSIS — O99612 Diseases of the digestive system complicating pregnancy, second trimester: Secondary | ICD-10-CM

## 2022-04-01 DIAGNOSIS — Z98891 History of uterine scar from previous surgery: Secondary | ICD-10-CM

## 2022-04-01 DIAGNOSIS — O99212 Obesity complicating pregnancy, second trimester: Secondary | ICD-10-CM

## 2022-04-01 DIAGNOSIS — O10919 Unspecified pre-existing hypertension complicating pregnancy, unspecified trimester: Secondary | ICD-10-CM

## 2022-04-01 DIAGNOSIS — O0990 Supervision of high risk pregnancy, unspecified, unspecified trimester: Secondary | ICD-10-CM

## 2022-04-01 DIAGNOSIS — K219 Gastro-esophageal reflux disease without esophagitis: Secondary | ICD-10-CM

## 2022-04-01 DIAGNOSIS — Z789 Other specified health status: Secondary | ICD-10-CM

## 2022-04-01 DIAGNOSIS — Z3A27 27 weeks gestation of pregnancy: Secondary | ICD-10-CM

## 2022-04-01 DIAGNOSIS — O0992 Supervision of high risk pregnancy, unspecified, second trimester: Secondary | ICD-10-CM

## 2022-04-01 DIAGNOSIS — O24912 Unspecified diabetes mellitus in pregnancy, second trimester: Secondary | ICD-10-CM

## 2022-04-01 DIAGNOSIS — O10912 Unspecified pre-existing hypertension complicating pregnancy, second trimester: Secondary | ICD-10-CM

## 2022-04-01 DIAGNOSIS — O24919 Unspecified diabetes mellitus in pregnancy, unspecified trimester: Secondary | ICD-10-CM

## 2022-04-01 MED ORDER — FAMOTIDINE 20 MG PO TABS
20.0000 mg | ORAL_TABLET | Freq: Two times a day (BID) | ORAL | 3 refills | Status: DC
  Filled 2022-04-01: qty 60, 30d supply, fill #0

## 2022-04-01 NOTE — Patient Instructions (Signed)
Elecci?n del m?todo anticonceptivo ?Contraception Choices ?La anticoncepci?n, o los m?todos anticonceptivos, hace referencia a los m?todos o dispositivos que evitan el embarazo. ?M?todos hormonales ? ?Implante anticonceptivo ?Un implante anticonceptivo consiste en un tubo delgado de pl?stico que contiene una hormona que evita el embarazo. Es diferente de un dispositivo intrauterino (DIU). Un m?dico lo inserta en la parte superior del brazo. Los implantes pueden ser eficaces durante un m?ximo de 3 a?os. ?Inyecciones de progestina sola ?Las inyecciones de progestina sola contienen progestina, una forma sint?tica de la hormona progesterona. Un m?dico las administra cada 3 meses. ?P?ldoras anticonceptivas ?Las p?ldoras anticonceptivas son pastillas que contienen hormonas que evitan el embarazo. Deben tomarse una vez al d?a, preferentemente a la misma hora cada d?a. Se necesita una receta para utilizar este m?todo anticonceptivo. ?Parche anticonceptivo ?El parche anticonceptivo contiene hormonas que evitan el embarazo. Se coloca en la piel, debe cambiarse una vez a la semana durante tres semanas y debe retirarse en la cuarta semana. Se necesita una receta para utilizar este m?todo anticonceptivo. ?Anillo vaginal ?Un anillo vaginal contiene hormonas que evitan el embarazo. Se coloca en la vagina durante tres semanas y se retira en la cuarta semana. Luego se repite el proceso con un anillo nuevo. Se necesita una receta para utilizar este m?todo anticonceptivo. ?Anticonceptivo de emergencia ?Los anticonceptivos de emergencia son m?todos para evitar un embarazo despu?s de tener sexo sin protecci?n. Vienen en forma de p?ldora y pueden tomarse hasta 5 d?as despu?s de tener sexo. Funcionan mejor cuando se toman lo m?s pronto posible luego de tener sexo. La mayor?a de los anticonceptivos de emergencia est?n disponibles sin receta m?dica. Este m?todo no debe utilizarse como el ?nico m?todo anticonceptivo. ?M?todos de  barrera ? ?Cond?n masculino ?Un cond?n masculino es una vaina delgada que se coloca sobre el pene durante el sexo. Los condones evitan que el esperma ingrese en el cuerpo de la mujer. Pueden utilizarse con un una sustancia que mata a los espermatozoides (espermicida) para aumentar la efectividad. Deben desecharse despu?s de un uso. ?Cond?n femenino ?Un cond?n femenino es una vaina blanda y holgada que se coloca en la vagina antes de tener sexo. El cond?n evita que el esperma ingrese en el cuerpo de la mujer. Deben desecharse despu?s de un uso. ?Diafragma ?Un diafragma es una barrera blanda con forma de c?pula. Se inserta en la vagina antes del sexo, junto con un espermicida. El diafragma bloquea el ingreso de esperma en el ?tero, y el espermicida mata a los espermatozoides. El diafragma debe permanecer en la vagina durante 6 a 8 horas despu?s de tener sexo y debe retirarse en el plazo de las 24 horas. ?Un diafragma es recetado y colocado por un m?dico. Debe reemplazarse cada 1 a 2 a?os, despu?s de dar a luz, de aumentar m?s de 15?lb (6.8?kg) y de una cirug?a p?lvica. ?Capuch?n cervical ?Un capuch?n cervical es una copa redonda y blanda de l?tex o pl?stico que se coloca en el cuello uterino. Se inserta en la vagina antes del sexo, junto con un espermicida. Bloquea el ingreso del esperma en el ?tero. El capuch?n debe permanecer en el lugar durante 6 a 8 horas despu?s de tener sexo y debe retirarse en el plazo de las 48 horas. Un capuch?n cervical debe ser recetado y colocado por un m?dico. Debe reemplazarse cada 2?a?os. ?Esponja ?Una esponja es una pieza blanda y circular de espuma de poliuretano que contiene espermicida. La esponja ayuda a bloquear el ingreso de esperma en el ?tero, y el espermicida   mata a los espermatozoides. Para utilizarla, debe humedecerla e insertarla en la vagina. Debe insertarse antes de tener sexo, debe permanecer dentro al menos durante 6 horas despu?s de tener sexo y debe retirarse y  desecharse en el plazo de las 30 horas. ?Espermicidas ?Los espermicidas son sustancias qu?micas que matan o bloquean al esperma y no lo dejan ingresar al cuello uterino y al ?tero. Vienen en forma de crema, gel, supositorio, espuma o comprimido. Un espermicida debe insertarse en la vagina con un aplicador al menos 10 o 15 minutos antes de tener sexo para dar tiempo a que surta efecto. El proceso debe repetirse cada vez que tenga sexo. Los espermicidas no requieren receta m?dica. ?Anticonceptivos intrauterinos ?Dispositivo intrauterino (DIU) ?Un DIU es un dispositivo en forma de T que se coloca en el ?tero. Existen dos tipos: ?DIU hormonal.Este tipo contiene progestina, una forma sint?tica de la hormona progesterona. Este tipo puede permanecer colocado durante 3 a 5 a?os. ?DIU de cobre.Este tipo est? recubierto con un alambre de cobre. Puede permanecer colocado durante 10 a?os. ?M?todos anticonceptivos permanentes ?Ligadura de trompas en la mujer ?En este m?todo, se sellan, atan u obstruyen las trompas de Falopio durante una cirug?a para evitar que el ?vulo descienda hacia el ?tero. ?Esterilizaci?n histerosc?pica ?En este m?todo, se coloca un implante peque?o y flexible dentro de cada trompa de Falopio. Los implantes hacen que se forme un tejido cicatricial en las trompas de Falopio y que las obstruya para que el espermatozoide no pueda llegar al ?vulo. El procedimiento demora alrededor de 3 meses para que sea efectivo. Debe utilizarse otro m?todo anticonceptivo durante esos 3 meses. ?Esterilizaci?n masculina ?Este es un procedimiento que consiste en atar los conductos que transportan el esperma (vasectom?a). Luego del procedimiento, el hombre puede eyacular l?quido (semen). Debe utilizarse otro m?todo anticonceptivo durante 3 meses despu?s del procedimiento. ?M?todos de planificaci?n natural ?Planificaci?n familiar natural ?En este m?todo, la pareja no tiene sexo durante los d?as en que la mujer podr?a quedar  embarazada. ?M?todo calendario ?En este m?todo, la mujer realiza un seguimiento de la duraci?n de cada ciclo menstrual, identifica los d?as en los que se puede producir un embarazo y no tiene sexo durante esos d?as. ?M?todo de la ovulaci?n ?En este m?todo, la pareja evita tener sexo durante la ovulaci?n. ?M?todo sintot?rmico ?Este m?todo implica no tener sexo durante la ovulaci?n. Normalmente, la mujer comprueba la ovulaci?n al observar cambios en su temperatura y en la consistencia del moco cervical. ?M?todo posovulaci?n ?En este m?todo, la pareja espera a que finalice la ovulaci?n para tener sexo. ?D?nde buscar m?s informaci?n ?Centers for Disease Control and Prevention (Centros para el Control y la Prevenci?n de Enfermedades): www.cdc.gov ?Resumen ?La anticoncepci?n, o los m?todos anticonceptivos, hace referencia a los m?todos o dispositivos que evitan el embarazo. ?Los m?todos anticonceptivos hormonales incluyen implantes, inyecciones, pastillas, parches, anillos vaginales y anticonceptivos de emergencia. ?Los m?todos anticonceptivos de barrera pueden incluir condones masculinos, condones femeninos, diafragmas, capuchones cervicales, esponjas y espermicidas. ?Existen dos tipos de DIU (dispositivo intrauterino). Un DIU puede colocarse en el ?tero de una mujer para evitar el embarazo durante 3 a 5 a?os. ?La esterilizaci?n permanente puede realizarse mediante un procedimiento tanto en los hombres como en las mujeres. Los m?todos de planificaci?n familiar natural implican no tener sexo durante los d?as en que la mujer podr?a quedar embarazada. ?Esta informaci?n no tiene como fin reemplazar el consejo del m?dico. Aseg?rese de hacerle al m?dico cualquier pregunta que tenga. ?Document Revised: 08/09/2019 Document Reviewed: 08/09/2019 ?Elsevier Patient Education ?   2023 Elsevier Inc. ? ?

## 2022-04-01 NOTE — Progress Notes (Signed)
   Subjective:  Martha [name] Soc is a 37 y.o. [id number] at 106w2d being seen today for ongoing prenatal care.  She is currently monitored for the following issues for this high-risk pregnancy and has Diabetes mellitus affecting pregnancy, antepartum; Language barrier; BMI 32.0-32.9,adult; History of depression; Diabetes mellitus type 2, uncontrolled, with complications; Essential hypertension; Obesity affecting pregnancy, antepartum; Positive for macroalbuminuria; Chronic hypertension affecting pregnancy; Supervision of high risk pregnancy, antepartum; History of cesarean delivery; Advanced maternal age in multigravida, second trimester; UTI in pregnancy; and History of severe pre-eclampsia on their problem list.  Patient reports no complaints.  Contractions: Irritability. Vag. Bleeding: None.  Movement: Present. Denies leaking of fluid.   The following portions of the patient's history were reviewed and updated as appropriate: allergies, current medications, past family history, past medical history, past social history, past surgical history and problem list. Problem list updated.  Objective:   Vitals:   04/01/22 1200  BP: 126/86  Pulse: 93  Weight: 173 lb 6.4 oz (78.7 kg)    Fetal Status: Fetal Heart Rate (bpm): 139   Movement: Present     General:  Alert, oriented and cooperative. Patient is in no acute distress.  Skin: Skin is warm and dry. No rash noted.   Cardiovascular: Normal heart rate noted  Respiratory: Normal respiratory effort, no problems with respiration noted  Abdomen: Soft, gravid, appropriate for gestational age. Pain/Pressure: Present     Pelvic: Vag. Bleeding: None     Cervical exam deferred        Extremities: Normal range of motion.  Edema: Trace  Mental Status: Normal mood and affect. Normal behavior. Normal judgment and thought content.   Urinalysis:        Assessment and Plan:  Pregnancy: [id number] at [redacted]w[redacted]d  1. Supervision of high risk pregnancy,  antepartum BP and FHR normal Third trimester labs next visit Given letter for [facility] TDAP vaccination  2. Gastroesophageal reflux in pregnancy Pepcid rx sent  3. Diabetes mellitus affecting pregnancy, antepartum Visit today to review sugars Currently on NPH 35u BID and Lispro 35u TID Log shows improvement but still not at goal, see photo Increase to NPH 40 BID and Lispro 40u TID Fetal echo not yet re-scheduled, we will help reschedule On ASA Normal growth [place] 03/24/22, following w MFM  4. Chronic hypertension affecting pregnancy Normotensive no meds, on ASA  5. Urinary tract infection in mother during second trimester of pregnancy Rx sent for duricef after last visit, she took full course TOC today  6. Obesity affecting pregnancy, antepartum, unspecified obesity type   7. Language barrier Spanish  8. History of severe pre-eclampsia On ASA normotensive  9. History of cesarean delivery Desires RCS+BTL, message sent to schedule  10. Advanced maternal age in multigravida, second trimester   Preterm labor symptoms and general obstetric precautions including but not limited to vaginal bleeding, contractions, leaking of fluid and fetal movement were reviewed in detail with the patient. Please refer to After Visit Summary for other counseling recommendations.  Return in 2 weeks (on 04/15/2022) for [facility], ob visit.   Clarnce Flock, MD

## 2022-04-04 ENCOUNTER — Other Ambulatory Visit: Payer: Self-pay

## 2022-04-05 LAB — CULTURE, OB URINE

## 2022-04-05 LAB — URINE CULTURE, OB REFLEX

## 2022-04-07 ENCOUNTER — Other Ambulatory Visit: Payer: Self-pay

## 2022-04-07 DIAGNOSIS — O0990 Supervision of high risk pregnancy, unspecified, unspecified trimester: Secondary | ICD-10-CM

## 2022-04-07 MED ORDER — NITROFURANTOIN MONOHYD MACRO 100 MG PO CAPS
100.0000 mg | ORAL_CAPSULE | Freq: Two times a day (BID) | ORAL | 0 refills | Status: DC
  Filled 2022-04-07: qty 14, 7d supply, fill #0

## 2022-04-07 NOTE — Addendum Note (Signed)
Addended by: Clayton Lefort on: 04/07/2022 10:13 AM   Modules accepted: Orders

## 2022-04-08 ENCOUNTER — Ambulatory Visit (HOSPITAL_BASED_OUTPATIENT_CLINIC_OR_DEPARTMENT_OTHER): Payer: Self-pay | Attending: Certified Nurse Midwife | Admitting: Physical Therapy

## 2022-04-10 ENCOUNTER — Other Ambulatory Visit: Payer: Self-pay

## 2022-04-14 ENCOUNTER — Other Ambulatory Visit: Payer: Self-pay

## 2022-04-15 ENCOUNTER — Encounter: Payer: Self-pay | Admitting: Family Medicine

## 2022-04-21 ENCOUNTER — Ambulatory Visit: Payer: Self-pay | Attending: Maternal & Fetal Medicine

## 2022-04-21 ENCOUNTER — Ambulatory Visit: Payer: Self-pay | Admitting: *Deleted

## 2022-04-21 VITALS — BP 123/80 | HR 103

## 2022-04-21 DIAGNOSIS — E669 Obesity, unspecified: Secondary | ICD-10-CM

## 2022-04-21 DIAGNOSIS — E119 Type 2 diabetes mellitus without complications: Secondary | ICD-10-CM

## 2022-04-21 DIAGNOSIS — O09293 Supervision of pregnancy with other poor reproductive or obstetric history, third trimester: Secondary | ICD-10-CM

## 2022-04-21 DIAGNOSIS — O24112 Pre-existing diabetes mellitus, type 2, in pregnancy, second trimester: Secondary | ICD-10-CM | POA: Insufficient documentation

## 2022-04-21 DIAGNOSIS — Z3A30 30 weeks gestation of pregnancy: Secondary | ICD-10-CM

## 2022-04-21 DIAGNOSIS — O09899 Supervision of other high risk pregnancies, unspecified trimester: Secondary | ICD-10-CM | POA: Insufficient documentation

## 2022-04-21 DIAGNOSIS — O09523 Supervision of elderly multigravida, third trimester: Secondary | ICD-10-CM

## 2022-04-21 DIAGNOSIS — O34219 Maternal care for unspecified type scar from previous cesarean delivery: Secondary | ICD-10-CM

## 2022-04-21 DIAGNOSIS — O99213 Obesity complicating pregnancy, third trimester: Secondary | ICD-10-CM

## 2022-04-21 DIAGNOSIS — O09213 Supervision of pregnancy with history of pre-term labor, third trimester: Secondary | ICD-10-CM

## 2022-04-21 DIAGNOSIS — O99212 Obesity complicating pregnancy, second trimester: Secondary | ICD-10-CM | POA: Insufficient documentation

## 2022-04-21 DIAGNOSIS — O24113 Pre-existing diabetes mellitus, type 2, in pregnancy, third trimester: Secondary | ICD-10-CM

## 2022-04-21 DIAGNOSIS — O0990 Supervision of high risk pregnancy, unspecified, unspecified trimester: Secondary | ICD-10-CM | POA: Insufficient documentation

## 2022-04-21 DIAGNOSIS — O10912 Unspecified pre-existing hypertension complicating pregnancy, second trimester: Secondary | ICD-10-CM | POA: Insufficient documentation

## 2022-04-21 DIAGNOSIS — O10013 Pre-existing essential hypertension complicating pregnancy, third trimester: Secondary | ICD-10-CM

## 2022-04-21 DIAGNOSIS — Z794 Long term (current) use of insulin: Secondary | ICD-10-CM

## 2022-04-23 ENCOUNTER — Encounter: Payer: Self-pay | Admitting: Family Medicine

## 2022-05-05 ENCOUNTER — Ambulatory Visit: Payer: Self-pay | Admitting: *Deleted

## 2022-05-05 ENCOUNTER — Encounter: Payer: Self-pay | Admitting: *Deleted

## 2022-05-05 ENCOUNTER — Ambulatory Visit: Payer: Self-pay

## 2022-05-05 ENCOUNTER — Ambulatory Visit: Payer: Self-pay | Attending: Obstetrics | Admitting: *Deleted

## 2022-05-05 VITALS — BP 133/89 | HR 106

## 2022-05-05 DIAGNOSIS — O0990 Supervision of high risk pregnancy, unspecified, unspecified trimester: Secondary | ICD-10-CM

## 2022-05-05 DIAGNOSIS — Z3A32 32 weeks gestation of pregnancy: Secondary | ICD-10-CM | POA: Insufficient documentation

## 2022-05-05 DIAGNOSIS — O10913 Unspecified pre-existing hypertension complicating pregnancy, third trimester: Secondary | ICD-10-CM | POA: Insufficient documentation

## 2022-05-05 DIAGNOSIS — O24113 Pre-existing diabetes mellitus, type 2, in pregnancy, third trimester: Secondary | ICD-10-CM | POA: Insufficient documentation

## 2022-05-05 NOTE — Procedures (Signed)
Martha [name] Soc 12/21/1985 [redacted]w[redacted]d  Fetus A Non-Stress Test Interpretation for 05/05/22  Indication: Diabetes  Fetal Heart Rate A Mode: External Baseline Rate (A): 130 bpm Variability: Moderate Accelerations: 15 x 15 Decelerations: None Multiple birth?: No  Uterine Activity Mode: Palpation, Toco Contraction Frequency (min): none Resting Tone Palpated: Relaxed  Interpretation (Fetal Testing) Nonstress Test Interpretation: Reactive Overall Impression: Reassuring for gestational age Comments: Dr. Parke Poisson reviewed tracing

## 2022-05-12 ENCOUNTER — Other Ambulatory Visit: Payer: Self-pay | Admitting: *Deleted

## 2022-05-12 ENCOUNTER — Ambulatory Visit: Payer: Self-pay | Admitting: *Deleted

## 2022-05-12 ENCOUNTER — Ambulatory Visit: Payer: Self-pay | Attending: Maternal & Fetal Medicine

## 2022-05-12 VITALS — BP 134/94 | HR 102

## 2022-05-12 DIAGNOSIS — E119 Type 2 diabetes mellitus without complications: Secondary | ICD-10-CM

## 2022-05-12 DIAGNOSIS — O09523 Supervision of elderly multigravida, third trimester: Secondary | ICD-10-CM

## 2022-05-12 DIAGNOSIS — O24113 Pre-existing diabetes mellitus, type 2, in pregnancy, third trimester: Secondary | ICD-10-CM

## 2022-05-12 DIAGNOSIS — O24112 Pre-existing diabetes mellitus, type 2, in pregnancy, second trimester: Secondary | ICD-10-CM | POA: Insufficient documentation

## 2022-05-12 DIAGNOSIS — O09899 Supervision of other high risk pregnancies, unspecified trimester: Secondary | ICD-10-CM | POA: Insufficient documentation

## 2022-05-12 DIAGNOSIS — Z794 Long term (current) use of insulin: Secondary | ICD-10-CM

## 2022-05-12 DIAGNOSIS — O99213 Obesity complicating pregnancy, third trimester: Secondary | ICD-10-CM

## 2022-05-12 DIAGNOSIS — O10919 Unspecified pre-existing hypertension complicating pregnancy, unspecified trimester: Secondary | ICD-10-CM

## 2022-05-12 DIAGNOSIS — Z3A33 33 weeks gestation of pregnancy: Secondary | ICD-10-CM

## 2022-05-12 DIAGNOSIS — E669 Obesity, unspecified: Secondary | ICD-10-CM

## 2022-05-12 DIAGNOSIS — O34219 Maternal care for unspecified type scar from previous cesarean delivery: Secondary | ICD-10-CM

## 2022-05-12 DIAGNOSIS — O10013 Pre-existing essential hypertension complicating pregnancy, third trimester: Secondary | ICD-10-CM

## 2022-05-12 DIAGNOSIS — O0990 Supervision of high risk pregnancy, unspecified, unspecified trimester: Secondary | ICD-10-CM | POA: Insufficient documentation

## 2022-05-12 DIAGNOSIS — O10912 Unspecified pre-existing hypertension complicating pregnancy, second trimester: Secondary | ICD-10-CM | POA: Insufficient documentation

## 2022-05-12 DIAGNOSIS — O99212 Obesity complicating pregnancy, second trimester: Secondary | ICD-10-CM | POA: Insufficient documentation

## 2022-05-12 DIAGNOSIS — O09213 Supervision of pregnancy with history of pre-term labor, third trimester: Secondary | ICD-10-CM

## 2022-05-19 ENCOUNTER — Ambulatory Visit: Payer: Self-pay | Attending: Maternal & Fetal Medicine

## 2022-05-19 ENCOUNTER — Ambulatory Visit: Payer: Self-pay | Admitting: *Deleted

## 2022-05-19 ENCOUNTER — Other Ambulatory Visit: Payer: Self-pay | Admitting: Maternal & Fetal Medicine

## 2022-05-19 VITALS — BP 126/88 | HR 82

## 2022-05-19 DIAGNOSIS — O09213 Supervision of pregnancy with history of pre-term labor, third trimester: Secondary | ICD-10-CM

## 2022-05-19 DIAGNOSIS — O283 Abnormal ultrasonic finding on antenatal screening of mother: Secondary | ICD-10-CM | POA: Insufficient documentation

## 2022-05-19 DIAGNOSIS — O99213 Obesity complicating pregnancy, third trimester: Secondary | ICD-10-CM

## 2022-05-19 DIAGNOSIS — O0990 Supervision of high risk pregnancy, unspecified, unspecified trimester: Secondary | ICD-10-CM | POA: Insufficient documentation

## 2022-05-19 DIAGNOSIS — O10912 Unspecified pre-existing hypertension complicating pregnancy, second trimester: Secondary | ICD-10-CM | POA: Insufficient documentation

## 2022-05-19 DIAGNOSIS — O34219 Maternal care for unspecified type scar from previous cesarean delivery: Secondary | ICD-10-CM

## 2022-05-19 DIAGNOSIS — O09899 Supervision of other high risk pregnancies, unspecified trimester: Secondary | ICD-10-CM

## 2022-05-19 DIAGNOSIS — O09523 Supervision of elderly multigravida, third trimester: Secondary | ICD-10-CM

## 2022-05-19 DIAGNOSIS — E669 Obesity, unspecified: Secondary | ICD-10-CM

## 2022-05-19 DIAGNOSIS — O99212 Obesity complicating pregnancy, second trimester: Secondary | ICD-10-CM

## 2022-05-19 DIAGNOSIS — O24112 Pre-existing diabetes mellitus, type 2, in pregnancy, second trimester: Secondary | ICD-10-CM

## 2022-05-19 DIAGNOSIS — Z3A34 34 weeks gestation of pregnancy: Secondary | ICD-10-CM

## 2022-05-19 DIAGNOSIS — E119 Type 2 diabetes mellitus without complications: Secondary | ICD-10-CM

## 2022-05-19 DIAGNOSIS — O365930 Maternal care for other known or suspected poor fetal growth, third trimester, not applicable or unspecified: Secondary | ICD-10-CM

## 2022-05-19 DIAGNOSIS — O10013 Pre-existing essential hypertension complicating pregnancy, third trimester: Secondary | ICD-10-CM

## 2022-05-19 NOTE — Procedures (Signed)
CHASITY OUTTEN Soc 1985-09-24 [redacted]w[redacted]d  Fetus A Non-Stress Test Interpretation for 05/19/22  Indication: Unsatisfactory BPP  Fetal Heart Rate A Mode: External Baseline Rate (A): 130 bpm Variability: Moderate Accelerations: 15 x 15 Decelerations: None Multiple birth?: No  Uterine Activity Mode: Palpation, Toco Contraction Frequency (min): none Resting Tone Palpated: Relaxed  Interpretation (Fetal Testing) Nonstress Test Interpretation: Reactive Overall Impression: Reassuring for gestational age Comments: Dr. Darra Lis reviewed tracing.

## 2022-05-27 ENCOUNTER — Encounter: Payer: Self-pay | Admitting: Family Medicine

## 2022-05-27 NOTE — Progress Notes (Signed)
Alert received on 05/19/2022 through [facility] system as follows: Patient identified as having high BP reading (134/94) on 05/12/2022  Chart review: Blood pressure was rechecked on 05/19/2022. Recheck was 126/88.   No action needed.  Lowry Bowl, CMA 05/27/2022  5:28 PM

## 2022-05-28 ENCOUNTER — Inpatient Hospital Stay (HOSPITAL_COMMUNITY)
Admission: AD | Admit: 2022-05-28 | Discharge: 2022-06-01 | DRG: 785 | Disposition: A | Discharge status: Home / Self Care | Payer: Medicaid Other | Attending: Obstetrics and Gynecology | Admitting: Obstetrics and Gynecology

## 2022-05-28 ENCOUNTER — Other Ambulatory Visit: Payer: Self-pay | Admitting: Obstetrics

## 2022-05-28 ENCOUNTER — Encounter (HOSPITAL_COMMUNITY): Payer: Self-pay | Admitting: Obstetrics & Gynecology

## 2022-05-28 ENCOUNTER — Encounter (HOSPITAL_COMMUNITY)
Admission: AD | Disposition: A | Discharge status: Home / Self Care | Payer: Self-pay | Source: Home / Self Care | Attending: Obstetrics and Gynecology

## 2022-05-28 ENCOUNTER — Inpatient Hospital Stay (HOSPITAL_COMMUNITY): Payer: Medicaid Other | Admitting: Anesthesiology

## 2022-05-28 ENCOUNTER — Ambulatory Visit (HOSPITAL_BASED_OUTPATIENT_CLINIC_OR_DEPARTMENT_OTHER): Payer: Medicaid Other

## 2022-05-28 ENCOUNTER — Ambulatory Visit: Payer: Medicaid Other | Admitting: *Deleted

## 2022-05-28 VITALS — BP 171/94 | HR 75

## 2022-05-28 DIAGNOSIS — O0990 Supervision of high risk pregnancy, unspecified, unspecified trimester: Secondary | ICD-10-CM

## 2022-05-28 DIAGNOSIS — O365930 Maternal care for other known or suspected poor fetal growth, third trimester, not applicable or unspecified: Secondary | ICD-10-CM | POA: Diagnosis present

## 2022-05-28 DIAGNOSIS — Z603 Acculturation difficulty: Secondary | ICD-10-CM | POA: Diagnosis present

## 2022-05-28 DIAGNOSIS — Z7982 Long term (current) use of aspirin: Secondary | ICD-10-CM

## 2022-05-28 DIAGNOSIS — O1414 Severe pre-eclampsia complicating childbirth: Secondary | ICD-10-CM

## 2022-05-28 DIAGNOSIS — O09523 Supervision of elderly multigravida, third trimester: Secondary | ICD-10-CM | POA: Diagnosis not present

## 2022-05-28 DIAGNOSIS — O24424 Gestational diabetes mellitus in childbirth, insulin controlled: Secondary | ICD-10-CM | POA: Diagnosis not present

## 2022-05-28 DIAGNOSIS — O34211 Maternal care for low transverse scar from previous cesarean delivery: Secondary | ICD-10-CM | POA: Diagnosis present

## 2022-05-28 DIAGNOSIS — O99213 Obesity complicating pregnancy, third trimester: Secondary | ICD-10-CM

## 2022-05-28 DIAGNOSIS — Z3A35 35 weeks gestation of pregnancy: Secondary | ICD-10-CM | POA: Insufficient documentation

## 2022-05-28 DIAGNOSIS — O10913 Unspecified pre-existing hypertension complicating pregnancy, third trimester: Secondary | ICD-10-CM | POA: Insufficient documentation

## 2022-05-28 DIAGNOSIS — O09522 Supervision of elderly multigravida, second trimester: Secondary | ICD-10-CM | POA: Diagnosis present

## 2022-05-28 DIAGNOSIS — Z98891 History of uterine scar from previous surgery: Secondary | ICD-10-CM

## 2022-05-28 DIAGNOSIS — Z23 Encounter for immunization: Secondary | ICD-10-CM

## 2022-05-28 DIAGNOSIS — Z302 Encounter for sterilization: Secondary | ICD-10-CM

## 2022-05-28 DIAGNOSIS — O09293 Supervision of pregnancy with other poor reproductive or obstetric history, third trimester: Secondary | ICD-10-CM

## 2022-05-28 DIAGNOSIS — O26893 Other specified pregnancy related conditions, third trimester: Secondary | ICD-10-CM | POA: Insufficient documentation

## 2022-05-28 DIAGNOSIS — E1165 Type 2 diabetes mellitus with hyperglycemia: Secondary | ICD-10-CM | POA: Diagnosis present

## 2022-05-28 DIAGNOSIS — E669 Obesity, unspecified: Secondary | ICD-10-CM

## 2022-05-28 DIAGNOSIS — O114 Pre-existing hypertension with pre-eclampsia, complicating childbirth: Principal | ICD-10-CM | POA: Diagnosis present

## 2022-05-28 DIAGNOSIS — O09513 Supervision of elderly primigravida, third trimester: Secondary | ICD-10-CM

## 2022-05-28 DIAGNOSIS — O99214 Obesity complicating childbirth: Secondary | ICD-10-CM | POA: Diagnosis present

## 2022-05-28 DIAGNOSIS — O2412 Pre-existing diabetes mellitus, type 2, in childbirth: Secondary | ICD-10-CM | POA: Diagnosis present

## 2022-05-28 DIAGNOSIS — O1410 Severe pre-eclampsia, unspecified trimester: Secondary | ICD-10-CM | POA: Diagnosis present

## 2022-05-28 DIAGNOSIS — O24113 Pre-existing diabetes mellitus, type 2, in pregnancy, third trimester: Secondary | ICD-10-CM

## 2022-05-28 DIAGNOSIS — R1013 Epigastric pain: Secondary | ICD-10-CM | POA: Insufficient documentation

## 2022-05-28 DIAGNOSIS — O09213 Supervision of pregnancy with history of pre-term labor, third trimester: Secondary | ICD-10-CM

## 2022-05-28 DIAGNOSIS — Z794 Long term (current) use of insulin: Secondary | ICD-10-CM | POA: Insufficient documentation

## 2022-05-28 DIAGNOSIS — O10919 Unspecified pre-existing hypertension complicating pregnancy, unspecified trimester: Secondary | ICD-10-CM

## 2022-05-28 DIAGNOSIS — O34219 Maternal care for unspecified type scar from previous cesarean delivery: Secondary | ICD-10-CM

## 2022-05-28 DIAGNOSIS — Z362 Encounter for other antenatal screening follow-up: Secondary | ICD-10-CM | POA: Insufficient documentation

## 2022-05-28 DIAGNOSIS — O2492 Unspecified diabetes mellitus in childbirth: Secondary | ICD-10-CM

## 2022-05-28 DIAGNOSIS — O10013 Pre-existing essential hypertension complicating pregnancy, third trimester: Secondary | ICD-10-CM

## 2022-05-28 DIAGNOSIS — O1092 Unspecified pre-existing hypertension complicating childbirth: Secondary | ICD-10-CM | POA: Diagnosis present

## 2022-05-28 DIAGNOSIS — E119 Type 2 diabetes mellitus without complications: Secondary | ICD-10-CM

## 2022-05-28 DIAGNOSIS — R030 Elevated blood-pressure reading, without diagnosis of hypertension: Secondary | ICD-10-CM | POA: Diagnosis present

## 2022-05-28 DIAGNOSIS — I10 Essential (primary) hypertension: Secondary | ICD-10-CM | POA: Diagnosis present

## 2022-05-28 DIAGNOSIS — O99210 Obesity complicating pregnancy, unspecified trimester: Secondary | ICD-10-CM | POA: Diagnosis present

## 2022-05-28 DIAGNOSIS — O24919 Unspecified diabetes mellitus in pregnancy, unspecified trimester: Secondary | ICD-10-CM | POA: Diagnosis present

## 2022-05-28 LAB — TYPE AND SCREEN
ABO/RH(D): O POS
Antibody Screen: NEGATIVE

## 2022-05-28 LAB — CBC
HCT: 39.7 % (ref 36.0–46.0)
Hemoglobin: 13.7 g/dL (ref 12.0–15.0)
MCH: 29.7 pg (ref 26.0–34.0)
MCHC: 34.5 g/dL (ref 30.0–36.0)
MCV: 85.9 fL (ref 80.0–100.0)
Platelets: 284 10*3/uL (ref 150–400)
RBC: 4.62 MIL/uL (ref 3.87–5.11)
RDW: 12.8 % (ref 11.5–15.5)
WBC: 7.3 10*3/uL (ref 4.0–10.5)
nRBC: 0 % (ref 0.0–0.2)

## 2022-05-28 LAB — COMPREHENSIVE METABOLIC PANEL
ALT: 16 U/L (ref 0–44)
AST: 22 U/L (ref 15–41)
Albumin: 1.9 g/dL — ABNORMAL LOW (ref 3.5–5.0)
Alkaline Phosphatase: 81 U/L (ref 38–126)
Anion gap: 13 (ref 5–15)
BUN: 9 mg/dL (ref 6–20)
CO2: 19 mmol/L — ABNORMAL LOW (ref 22–32)
Calcium: 8.3 mg/dL — ABNORMAL LOW (ref 8.9–10.3)
Chloride: 97 mmol/L — ABNORMAL LOW (ref 98–111)
Creatinine, Ser: 0.69 mg/dL (ref 0.44–1.00)
GFR, Estimated: >60 mL/min (ref >60)
Glucose, Bld: 467 mg/dL — ABNORMAL HIGH (ref 70–99)
Potassium: 3.6 mmol/L (ref 3.5–5.1)
Sodium: 129 mmol/L — ABNORMAL LOW (ref 135–145)
Total Bilirubin: 0.4 mg/dL (ref 0.3–1.2)
Total Protein: 5.4 g/dL — ABNORMAL LOW (ref 6.5–8.1)

## 2022-05-28 LAB — PROTEIN / CREATININE RATIO, URINE
Creatinine, Urine: 42 mg/dL
Protein Creatinine Ratio: 9.88 mg/mg{Cre} — ABNORMAL HIGH (ref 0.00–0.15)
Total Protein, Urine: 415 mg/dL

## 2022-05-28 LAB — GLUCOSE, CAPILLARY
Glucose-Capillary: 387 mg/dL — ABNORMAL HIGH (ref 70–99)
Glucose-Capillary: 400 mg/dL — ABNORMAL HIGH (ref 70–99)
Glucose-Capillary: 446 mg/dL — ABNORMAL HIGH (ref 70–99)
Glucose-Capillary: 306 mg/dL — ABNORMAL HIGH (ref 70–99)

## 2022-05-28 SURGERY — Surgical Case
Anesthesia: Spinal | Site: Abdomen | Laterality: Bilateral

## 2022-05-28 MED ORDER — POVIDONE-IODINE 10 % EX SWAB: 2.0000 | Freq: Once | CUTANEOUS | Status: DC

## 2022-05-28 MED ORDER — INSULIN PUMP
SUBCUTANEOUS | Status: DC
Start: 2022-05-29 — End: 2022-05-29

## 2022-05-28 MED ORDER — SODIUM CHLORIDE 0.9% FLUSH: 3.0000 mL | INTRAVENOUS | Status: DC | PRN

## 2022-05-28 MED ORDER — OXYCODONE HCL 5 MG PO TABS: 5.0000 mg | ORAL_TABLET | Freq: Once | ORAL | Status: DC | PRN

## 2022-05-28 MED ORDER — MEPERIDINE HCL 25 MG/ML IJ SOLN: 6.2500 mg | INTRAMUSCULAR | Status: DC | PRN

## 2022-05-28 MED ORDER — LABETALOL HCL 5 MG/ML IV SOLN
20.0000 mg | INTRAVENOUS | Status: DC | PRN
  Administered 2022-05-28: 20 mg via INTRAVENOUS
  Filled 2022-05-28 (×2): qty 4

## 2022-05-28 MED ORDER — OXYTOCIN-SODIUM CHLORIDE 30-0.9 UT/500ML-% IV SOLN
INTRAVENOUS | Status: DC | PRN
  Administered 2022-05-28: 300 mL via INTRAVENOUS

## 2022-05-28 MED ORDER — LACTATED RINGERS IV SOLN: INTRAVENOUS | Status: DC

## 2022-05-28 MED ORDER — NALOXONE HCL 4 MG/10ML IJ SOLN: 1.0000 ug/kg/h | INTRAVENOUS | Status: DC | PRN

## 2022-05-28 MED ORDER — OXYCODONE HCL 5 MG/5ML PO SOLN: 5.0000 mg | Freq: Once | ORAL | Status: DC | PRN

## 2022-05-28 MED ORDER — HYDRALAZINE HCL 20 MG/ML IJ SOLN: 10.0000 mg | INTRAMUSCULAR | Status: DC | PRN

## 2022-05-28 MED ORDER — MORPHINE SULFATE (PF) 0.5 MG/ML IJ SOLN
INTRAMUSCULAR | Status: DC | PRN
  Administered 2022-05-28: 150 ug via INTRATHECAL

## 2022-05-28 MED ORDER — PHENYLEPHRINE 80 MCG/ML (10ML) SYRINGE FOR IV PUSH (FOR BLOOD PRESSURE SUPPORT)
PREFILLED_SYRINGE | INTRAVENOUS | Status: AC
  Filled 2022-05-28: qty 10

## 2022-05-28 MED ORDER — PHENYLEPHRINE HCL-NACL 20-0.9 MG/250ML-% IV SOLN
INTRAVENOUS | Status: AC
  Filled 2022-05-28: qty 250

## 2022-05-28 MED ORDER — SOD CITRATE-CITRIC ACID 500-334 MG/5ML PO SOLN
30.0000 mL | ORAL | Status: AC
  Administered 2022-05-28: 30 mL via ORAL
  Filled 2022-05-28: qty 30

## 2022-05-28 MED ORDER — PHENYLEPHRINE HCL-NACL 20-0.9 MG/250ML-% IV SOLN
INTRAVENOUS | Status: DC | PRN
  Administered 2022-05-28: 60 ug/min via INTRAVENOUS

## 2022-05-28 MED ORDER — SODIUM CHLORIDE 0.9 % IR SOLN
Status: DC | PRN
  Administered 2022-05-28: 1

## 2022-05-28 MED ORDER — ONDANSETRON HCL 4 MG/2ML IJ SOLN: 4.0000 mg | Freq: Three times a day (TID) | INTRAMUSCULAR | Status: DC | PRN

## 2022-05-28 MED ORDER — KETOROLAC TROMETHAMINE 30 MG/ML IJ SOLN: 30.0000 mg | Freq: Once | INTRAMUSCULAR | Status: DC | PRN

## 2022-05-28 MED ORDER — ONDANSETRON HCL 4 MG/2ML IJ SOLN
INTRAMUSCULAR | Status: AC
  Filled 2022-05-28: qty 2

## 2022-05-28 MED ORDER — NALOXONE HCL 0.4 MG/ML IJ SOLN: 0.4000 mg | INTRAMUSCULAR | Status: DC | PRN

## 2022-05-28 MED ORDER — DIPHENHYDRAMINE HCL 50 MG/ML IJ SOLN
12.5000 mg | INTRAMUSCULAR | Status: DC | PRN
  Administered 2022-05-29: 12.5 mg via INTRAVENOUS
  Filled 2022-05-28: qty 1

## 2022-05-28 MED ORDER — MORPHINE SULFATE (PF) 0.5 MG/ML IJ SOLN
INTRAMUSCULAR | Status: AC
  Filled 2022-05-28: qty 10

## 2022-05-28 MED ORDER — ACETAMINOPHEN 10 MG/ML IV SOLN
INTRAVENOUS | Status: DC | PRN
  Administered 2022-05-28: 1000 mg via INTRAVENOUS

## 2022-05-28 MED ORDER — MAGNESIUM SULFATE BOLUS VIA INFUSION
4.0000 g | Freq: Once | INTRAVENOUS | Status: AC
  Administered 2022-05-28: 4 g via INTRAVENOUS
  Filled 2022-05-28: qty 1000

## 2022-05-28 MED ORDER — FENTANYL CITRATE (PF) 100 MCG/2ML IJ SOLN
INTRAMUSCULAR | Status: AC
  Filled 2022-05-28: qty 2

## 2022-05-28 MED ORDER — MAGNESIUM SULFATE 40 GM/1000ML IV SOLN
2.0000 g/h | INTRAVENOUS | Status: DC
  Filled 2022-05-28: qty 1000

## 2022-05-28 MED ORDER — SCOPOLAMINE 1 MG/3DAYS TD PT72
1.0000 | MEDICATED_PATCH | Freq: Once | TRANSDERMAL | Status: AC
  Administered 2022-05-29: 1.5 mg via TRANSDERMAL

## 2022-05-28 MED ORDER — LABETALOL HCL 5 MG/ML IV SOLN
40.0000 mg | INTRAVENOUS | Status: DC | PRN
  Administered 2022-05-28: 40 mg via INTRAVENOUS
  Filled 2022-05-28: qty 8

## 2022-05-28 MED ORDER — LACTATED RINGERS IV SOLN: INTRAVENOUS | Status: DC | PRN

## 2022-05-28 MED ORDER — FENTANYL CITRATE (PF) 100 MCG/2ML IJ SOLN
INTRAMUSCULAR | Status: DC | PRN
  Administered 2022-05-28: 15 ug via INTRATHECAL

## 2022-05-28 MED ORDER — ONDANSETRON HCL 4 MG/2ML IJ SOLN
INTRAMUSCULAR | Status: DC | PRN
  Administered 2022-05-28: 4 mg via INTRAVENOUS

## 2022-05-28 MED ORDER — KETOROLAC TROMETHAMINE 30 MG/ML IJ SOLN
INTRAMUSCULAR | Status: AC
  Filled 2022-05-28: qty 1

## 2022-05-28 MED ORDER — SODIUM CHLORIDE 0.9 % IV SOLN: INTRAVENOUS | Status: DC

## 2022-05-28 MED ORDER — LABETALOL HCL 5 MG/ML IV SOLN
80.0000 mg | INTRAVENOUS | Status: DC | PRN
  Administered 2022-05-28: 80 mg via INTRAVENOUS
  Filled 2022-05-28: qty 16

## 2022-05-28 MED ORDER — DEXTROSE 50 % IV SOLN: 0.0000 mL | INTRAVENOUS | Status: DC | PRN

## 2022-05-28 MED ORDER — ACETAMINOPHEN 10 MG/ML IV SOLN
INTRAVENOUS | Status: AC
  Filled 2022-05-28: qty 100

## 2022-05-28 MED ORDER — STERILE WATER FOR IRRIGATION IR SOLN
Status: DC | PRN
  Administered 2022-05-28: 1000 mL

## 2022-05-28 MED ORDER — BUPIVACAINE IN DEXTROSE 0.75-8.25 % IT SOLN
INTRATHECAL | Status: DC | PRN
  Administered 2022-05-28: 12 mg via INTRATHECAL

## 2022-05-28 MED ORDER — HYDROMORPHONE HCL 1 MG/ML IJ SOLN: 0.2500 mg | INTRAMUSCULAR | Status: DC | PRN

## 2022-05-28 MED ORDER — CEFAZOLIN SODIUM-DEXTROSE 2-4 GM/100ML-% IV SOLN
2.0000 g | INTRAVENOUS | Status: AC
  Administered 2022-05-28: 2 g via INTRAVENOUS
  Filled 2022-05-28: qty 100

## 2022-05-28 MED ORDER — ONDANSETRON HCL 4 MG/2ML IJ SOLN: 4.0000 mg | Freq: Once | INTRAMUSCULAR | Status: DC | PRN

## 2022-05-28 MED ORDER — DIPHENHYDRAMINE HCL 25 MG PO CAPS: 25.0000 mg | ORAL_CAPSULE | ORAL | Status: DC | PRN

## 2022-05-28 MED ORDER — PHENYLEPHRINE 80 MCG/ML (10ML) SYRINGE FOR IV PUSH (FOR BLOOD PRESSURE SUPPORT)
PREFILLED_SYRINGE | INTRAVENOUS | Status: DC | PRN
  Administered 2022-05-28 (×2): 160 ug via INTRAVENOUS

## 2022-05-28 MED ORDER — OXYTOCIN-SODIUM CHLORIDE 30-0.9 UT/500ML-% IV SOLN
INTRAVENOUS | Status: AC
  Filled 2022-05-28: qty 500

## 2022-05-28 MED ORDER — SODIUM CHLORIDE 0.9 % IV SOLN
INTRAVENOUS | Status: DC | PRN
  Administered 2022-05-28: 500 mg via INTRAVENOUS

## 2022-05-28 MED ORDER — DEXTROSE-NACL 5-0.45 % IV SOLN: INTRAVENOUS | Status: DC

## 2022-05-28 MED ORDER — INSULIN REGULAR(HUMAN) IN NACL 100-0.9 UT/100ML-% IV SOLN
INTRAVENOUS | Status: DC
  Administered 2022-05-28: 18 [IU]/h via INTRAVENOUS
  Filled 2022-05-28: qty 100

## 2022-05-28 SURGICAL SUPPLY — 39 items
ADH SKN CLS APL DERMABOND .7 (GAUZE/BANDAGES/DRESSINGS) ×2
APL PRP STRL LF DISP 70% ISPRP (MISCELLANEOUS) ×1
CANISTER WOUND CARE 500ML ATS (WOUND CARE) IMPLANT
CHLORAPREP W/TINT 26 (MISCELLANEOUS) ×1 IMPLANT
CLAMP UMBILICAL CORD (MISCELLANEOUS) ×1 IMPLANT
CLOTH BEACON ORANGE TIMEOUT ST (SAFETY) ×1 IMPLANT
DERMABOND ADVANCED .7 DNX12 (GAUZE/BANDAGES/DRESSINGS) ×2 IMPLANT
DRAPE SHEET LG 3/4 BI-LAMINATE (DRAPES) IMPLANT
DRESSING PREVENA PLUS CUSTOM (GAUZE/BANDAGES/DRESSINGS) IMPLANT
DRSG OPSITE POSTOP 4X10 (GAUZE/BANDAGES/DRESSINGS) ×1 IMPLANT
DRSG PREVENA PLUS CUSTOM (GAUZE/BANDAGES/DRESSINGS) ×1
ELECT REM PT RETURN 9FT ADLT (ELECTROSURGICAL) ×1
ELECTRODE REM PT RTRN 9FT ADLT (ELECTROSURGICAL) ×1 IMPLANT
EXTRACTOR VACUUM BELL STYLE (SUCTIONS) IMPLANT
GLOVE BIOGEL PI IND STRL 7.0 (GLOVE) ×1 IMPLANT
GLOVE BIOGEL PI IND STRL 8 (GLOVE) ×1 IMPLANT
GLOVE ECLIPSE 8.0 STRL XLNG CF (GLOVE) ×1 IMPLANT
GOWN STRL REUS W/TWL LRG LVL3 (GOWN DISPOSABLE) ×2 IMPLANT
KIT ABG SYR 3ML LUER SLIP (SYRINGE) ×1 IMPLANT
LIGASURE IMPACT 36 18CM CVD LR (INSTRUMENTS) IMPLANT
NDL HYPO 25X5/8 SAFETYGLIDE (NEEDLE) ×1 IMPLANT
NEEDLE HYPO 25X5/8 SAFETYGLIDE (NEEDLE) ×1 IMPLANT
NS IRRIG 1000ML POUR BTL (IV SOLUTION) ×1 IMPLANT
PACK C SECTION WH (CUSTOM PROCEDURE TRAY) ×1 IMPLANT
PAD OB MATERNITY 4.3X12.25 (PERSONAL CARE ITEMS) ×1 IMPLANT
RTRCTR C-SECT PINK 25CM LRG (MISCELLANEOUS) IMPLANT
SUT CHROMIC 0 CT 1 (SUTURE) ×1 IMPLANT
SUT MNCRL 0 VIOLET CTX 36 (SUTURE) ×2 IMPLANT
SUT PLAIN 2 0 (SUTURE) ×1
SUT PLAIN 2 0 XLH (SUTURE) IMPLANT
SUT PLAIN ABS 2-0 CT1 27XMFL (SUTURE) IMPLANT
SUT VIC AB 0 CTX 36 (SUTURE) ×1
SUT VIC AB 0 CTX36XBRD ANBCTRL (SUTURE) ×1 IMPLANT
SUT VIC AB 2-0 CTX 36 (SUTURE) IMPLANT
SUT VIC AB 4-0 KS 27 (SUTURE) IMPLANT
SYR 20CC LL (SYRINGE) ×2 IMPLANT
TOWEL OR 17X24 6PK STRL BLUE (TOWEL DISPOSABLE) ×1 IMPLANT
TRAY FOLEY W/BAG SLVR 14FR LF (SET/KITS/TRAYS/PACK) IMPLANT
WATER STERILE IRR 1000ML POUR (IV SOLUTION) ×1 IMPLANT

## 2022-05-28 NOTE — Transfer of Care (Signed)
Immediate Anesthesia Transfer of Care Note  Patient: Martha [name]  Procedure(s) Performed: CESAREAN SECTION WITH BILATERAL TUBAL LIGATION (Bilateral: Abdomen)  Patient Location: PACU  Anesthesia Type:Spinal  Level of Consciousness: awake, alert , and oriented  Airway & Oxygen Therapy: Patient Spontanous Breathing  Post-op Assessment: Report given to RN and Post -op Vital signs reviewed and stable  Post vital signs: Reviewed and stable  Last Vitals:  Vitals Value Taken Time  BP 127/86 05/28/22 2345  Temp    Pulse 80 05/28/22 2347  Resp 17 05/28/22 2347  SpO2 97 % 05/28/22 2347  Vitals shown include unvalidated device data.  Last Pain:  Vitals:   05/28/22 1818  TempSrc:   PainSc: 3          Complications: No notable events documented.

## 2022-05-28 NOTE — Anesthesia Procedure Notes (Signed)
Spinal  Patient location during procedure: OB Start time: 05/28/2022 10:04 PM End time: 05/28/2022 10:08 PM Reason for block: surgical anesthesia Staffing Performed: anesthesiologist  Anesthesiologist: [name], MD Performed by: [name], MD Authorized by: [name], MD   Preanesthetic Checklist Completed: patient identified, IV checked, risks and benefits discussed, surgical consent, monitors and equipment checked, pre-op evaluation and timeout performed Spinal Block Patient position: sitting Prep: DuraPrep and site prepped and draped Patient monitoring: heart rate, cardiac monitor, continuous pulse ox and blood pressure Approach: midline Location: L3-4 Injection technique: single-shot Needle Needle type: Pencan  Needle gauge: 24 G Needle length: 10 cm Needle insertion depth: 6 cm Assessment Sensory level: T4 Events: CSF return Additional Notes  1Attempt (s). Pt tolerated procedure well.

## 2022-05-28 NOTE — Anesthesia Preprocedure Evaluation (Addendum)
Anesthesia Evaluation  Patient identified by MRN, date of birth, ID band Patient awake    Reviewed: Allergy & Precautions, Patient's Chart, lab work & pertinent test results  Airway Mallampati: II  TM Distance: >3 FB Neck ROM: Full    Dental no notable dental hx. (+) Teeth Intact, Dental Advisory Given   Pulmonary    Pulmonary exam normal breath sounds clear to auscultation       Cardiovascular hypertension (pre E on Mg++), Pt. on medications and Pt. on home beta blockers Normal cardiovascular exam Rhythm:Regular Rate:Normal     Neuro/Psych negative neurological ROS  negative psych ROS   GI/Hepatic negative GI ROS, Neg liver ROS,,,  Endo/Other  diabetes, Poorly Controlled, Type 2  BS 467  Now endo tool  Renal/GU Lab Results      Component                Value               Date                      CREATININE               0.69                05/28/2022               K                        3.6                 05/28/2022                 Musculoskeletal   Abdominal   Peds  Hematology Lab Results      Component                Value               Date                      WBC                      7.3                 05/28/2022                HGB                      13.7                05/28/2022                HCT                      39.7                05/28/2022               PLT                      284                 05/28/2022          T&S sent      Anesthesia Other Findings All: influenza vaccines  Reproductive/Obstetrics (+) Pregnancy  Anesthesia Physical Anesthesia Plan  ASA: 3  Anesthesia Plan: Spinal   Post-op Pain Management: Regional block* and Minimal or no pain anticipated   Induction:   PONV Risk Score and Plan: Treatment may vary due to age or medical condition and Ondansetron  Airway Management Planned: Natural Airway and Nasal  Cannula  Additional Equipment: None  Intra-op Plan:   Post-operative Plan:   Informed Consent: I have reviewed the patients History and Physical, chart, labs and discussed the procedure including the risks, benefits and alternatives for the proposed anesthesia with the patient or authorized representative who has indicated his/her understanding and acceptance.     Interpreter used for [employer] and Sales promotion account executive given  Plan Discussed with: CRNA  Anesthesia Plan Comments: (35.3 wk G6P5 w prE on Mg++,  DM2 ( uncontrolled) on Endo tool  for Repeat C/S x1 w TL hx taken w help from Translator)        Anesthesia Quick Evaluation

## 2022-05-28 NOTE — MAU Note (Addendum)
[name] is a 37 y.o. at [redacted]w[redacted]d here in MAU reporting: was at the clinic, they sent her here because of high BP and RUQ pain - was very strong, now is mild(Sunday).  Been having a lot fluid coming up since Sun night, clear and watery(having to wear a pad) no bleeding.  HA started on Sunday. Reports +FM.   Onset of complaint: Sunday Pain score: RUQ mild, HA 6 Vitals:   05/28/22 1800  BP: (!) 156/107  Pulse: 93  Resp: 20  Temp: 98.1 F (36.7 C)  SpO2: 99%     FHT:150 Lab orders placed from triage: urine obtained   Hx of PTD due to pre-eclampsia

## 2022-05-28 NOTE — H&P (Addendum)
LABOR AND DELIVERY ADMISSION HISTORY AND PHYSICAL NOTE  Martha [name] Soc is a 37 y.o. female [phone] with IUP at [redacted]w[redacted]d presenting from MAU for elevated blood pressures.   Patient has a history of chronic hypertension, severe pre-eclampsia, type 2 diabetes, and one prior cesarean. Additionally in this pregnancy fetus has IUGR. She went to MFM today for regularly scheduled antenatal testing. BPP was 8/8 and cord doppler was normal, but BP's were in the severe range. She was sent to MAU for further evaluation.  She reports she has had crampy pain in her RUQ for the past three days. Has had a headache over the past few days but does not have any now. Denies vision changes, chest pain, shortness of breath. Endorses LE edema.   Patient reports the fetal movement as active. Patient reports uterine contraction  activity as none. Patient reports  vaginal bleeding as none. Patient describes fluid per vagina as None.   She plans on breast feeding and bottle feeding feeding. Her contraception plan is: bilateral tubal ligation.  Prenatal History/Complications: PNC at [facility]:  @[redacted]w[redacted]d , CWD, normal anatomy, cephalic presentation, posterior fundal placenta, 5%ile, EFW 1917 grams  Pregnancy complications:  Patient Active Problem List   Diagnosis Date Noted   UTI in pregnancy 03/12/2022   History of severe pre-eclampsia 03/12/2022   History of cesarean delivery 02/19/2022   Advanced maternal age in multigravida, second trimester 02/19/2022   Supervision of high risk pregnancy, antepartum 02/04/2022   Chronic hypertension affecting pregnancy 10/11/2020   Positive for macroalbuminuria 03/22/2018   Diabetes mellitus type 2, uncontrolled, with complications 03/19/2018   Essential hypertension 03/19/2018   Obesity affecting pregnancy, antepartum 03/19/2018   Diabetes mellitus affecting pregnancy, antepartum 08/10/2013   Language barrier 08/10/2013   BMI 32.0-32.9,adult 08/10/2013   History of  depression 08/10/2013    Past Medical History: Past Medical History:  Diagnosis Date   Diabetes mellitus type 2, uncontrolled, with complications 03/19/2018   Essential hypertension 03/19/2018   Kidney infection 0105/2017   Severe pre-eclampsia 11/01/2020    Past Surgical History: Past Surgical History:  Procedure Laterality Date   APPENDECTOMY     CESAREAN SECTION N/A 11/17/2020   Procedure: CESAREAN SECTION;  Surgeon: Adam Phenix, MD;  Location: [facility] LD [facility]S;  Service: Obstetrics;  Laterality: N/A;   CHOLECYSTECTOMY      Obstetrical History: OB History     Gravida  6   Para  5   Term  3   Preterm  2   AB      Living  5      SAB      IAB      Ectopic      Multiple  0   Live Births  5           Social History: Social History   Socioeconomic History   Marital status: Single    Spouse name: Not on file   Number of children: Not on file   Years of education: never went to school.   Highest education level: Not on file  Occupational History   Not on file  Tobacco Use   Smoking status: Never   Smokeless tobacco: Never  Vaping Use   Vaping Use: Never used  Substance and Sexual Activity   Alcohol use: No   Drug use: No   Sexual activity: Yes    Birth control/protection: None  Other Topics Concern   Not on file  Social History Narrative  Not on file   Social Determinants of Health   Financial Resource Strain: Not on file  Food Insecurity: Food Insecurity Present (02/05/2022)   Hunger Vital Sign    Worried About Running Out of Food in the Last Year: Not on file    Ran Out of Food in the Last Year: Sometimes true  Transportation Needs: No Transportation Needs (02/05/2022)   PRAPARE - Administrator, Civil Service (Medical): No    Lack of Transportation (Non-Medical): No  Physical Activity: Not on file  Stress: Not on file  Social Connections: Not on file    Family History: Family History  Problem Relation Age of Onset    Diabetes Mother    Hypertension Mother    Heart disease Father    Mental retardation Sister     Allergies: Allergies  Allergen Reactions   Influenza Vaccines Itching and Rash    Medications Prior to Admission  Medication Sig Dispense Refill Last Dose   aspirin 81 MG chewable tablet Chew 1 tablet (81 mg total) by mouth daily. 30 tablet 5 05/28/2022   famotidine (PEPCID) 20 MG tablet Take 1 tablet (20 mg total) by mouth 2 (two) times daily. 60 tablet 3 05/28/2022   cefadroxil (DURICEF) 500 MG capsule Take 1 capsule (500 mg total) by mouth 2 (two) times daily. (Patient not taking: Reported on 03/12/2022) 14 capsule 0    cyclobenzaprine (FLEXERIL) 10 MG tablet Take 1 tablet (10 mg total) by mouth every 8 (eight) hours as needed for muscle spasms. (Patient not taking: Reported on 03/12/2022) 30 tablet 1    insulin lispro (HUMALOG) 100 UNIT/ML injection Inject 0.35 mLs (35 Units total) into the skin 3 (three) times daily before meals. (Patient not taking: Reported on 05/28/2022) 10 mL 11    insulin NPH Human (HUMULIN N) 100 UNIT/ML injection Inject 0.35 mLs (35 Units total) into the skin 2 (two) times daily before a meal. (Patient not taking: Reported on 05/28/2022) 10 mL 3    Insulin Syringe-Needle U-100 30G X 5/16" 0.3 ML MISC Use as directed twice daily. 100 each 0    Magnesium Oxide -Mg Supplement (MAG-OXIDE) 200 MG TABS Take 2 tablets (400 mg total) by mouth at bedtime. If that amount causes loose stools in the am, switch to 200mg  daily at bedtime. (Patient not taking: Reported on 04/21/2022) 60 tablet 3    nitrofurantoin, macrocrystal-monohydrate, (MACROBID) 100 MG capsule Take 1 capsule (100 mg total) by mouth 2 (two) times daily. (Patient not taking: Reported on 04/21/2022) 14 capsule 0    Prenatal Vit-Fe Fumarate-FA (PREPLUS) 27-1 MG TABS Take 1 tablet by mouth daily. (Patient not taking: Reported on 05/12/2022) 30 tablet 8      Review of Systems  All systems reviewed and negative except as stated  in HPI  Physical Exam BP (!) 162/98   Pulse 91   Temp 98.1 F (36.7 C) (Oral)   Resp 20   Ht 5\' 2"  (1.575 m)   Wt 83.8 kg   LMP 11/16/2021   SpO2 96%   BMI 33.80 kg/m   Physical Exam Vitals reviewed.  Constitutional:      General: She is not in acute distress.    Appearance: She is well-developed. She is not diaphoretic.  Cardiovascular:     Rate and Rhythm: Normal rate and regular rhythm.     Heart sounds: Normal heart sounds. No murmur heard. Pulmonary:     Effort: Pulmonary effort is normal. No respiratory distress.  Breath sounds: Normal breath sounds. No wheezing or rales.  Abdominal:     General: Bowel sounds are normal. There is no distension.     Palpations: Abdomen is soft.     Tenderness: There is no abdominal tenderness. There is no guarding or rebound.  Skin:    General: Skin is warm and dry.  Neurological:     Mental Status: She is alert.     Coordination: Coordination normal.     FHR: baseline 135, moderate variability, + accels, no decels, toco quiet Category I  Prenatal labs: ABO, Rh: O/Positive/-- (01/17 1619) Antibody: Negative (01/17 1619) Rubella: 5.62 (01/17 1619) RPR: Non Reactive (01/17 1619)  HBsAg: Negative (01/17 1619)  HIV: Non Reactive (01/17 1619)  GC/Chlamydia:  Neisseria Gonorrhea  Date Value Ref Range Status  02/05/2022 Negative  Final   Chlamydia  Date Value Ref Range Status  02/05/2022 Negative  Final   GBS:    Prenatal Transfer Tool  Maternal Diabetes: Yes:  Diabetes Type:  Insulin/Medication controlled Genetic Screening: Normal Maternal Ultrasounds/Referrals: IUGR Fetal Ultrasounds or other Referrals:  Fetal echo, Referred to Materal Fetal Medicine  Maternal Substance Abuse:  No Significant Maternal Medications:  None Significant Maternal Lab Results: Other: GBS unknown  Results for orders placed or performed during the hospital encounter of 05/28/22 (from the past 24 hour(s))  CBC   Collection Time: 05/28/22   6:29 PM  Result Value Ref Range   WBC 7.3 4.0 - 10.5 K/uL   RBC 4.62 3.87 - 5.11 MIL/uL   Hemoglobin 13.7 12.0 - 15.0 g/dL   HCT 16.1 09.6 - 04.5 %   MCV 85.9 80.0 - 100.0 fL   MCH 29.7 26.0 - 34.0 pg   MCHC 34.5 30.0 - 36.0 g/dL   RDW 40.9 81.1 - 91.4 %   Platelets 284 150 - 400 K/uL   nRBC 0.0 0.0 - 0.2 %    Assessment: Martha [name] Soc is a 37 y.o. [id number] at [redacted]w[redacted]d here for unscheduled, urgent cesarean section.  #Repeat Low Transverse Cesarean and Bilateral Tubal Ligation via Bilateral salpingectomy  The risks of cesarean section were discussed with the patient including but were not limited to: bleeding which may require transfusion or reoperation; infection which may require antibiotics; injury to bowel, bladder, ureters or other surrounding organs; injury to the fetus; need for additional procedures including hysterectomy in the event of a life-threatening hemorrhage; placental abnormalities wth subsequent pregnancies, incisional problems, thromboembolic phenomenon and other postoperative/anesthesia complications.  Patient also desires permanent sterilization.  Other reversible forms of contraception were discussed with patient; she declines all other modalities. Risks of procedure discussed with patient including but not limited to: risk of regret, permanence of method, bleeding, infection, injury to surrounding organs and need for additional procedures.  Failure risk of about 1% with increased risk of ectopic gestation if pregnancy occurs was also discussed with patient.  Also discussed possibility of post-tubal pain syndrome. The patient concurred with the proposed plan, giving informed written consent for the procedures.  Patient last ate around 1530, will plan to go to OR around 2330. Anesthesia and OR aware.  Preoperative prophylactic antibiotics and SCDs ordered on call to the OR.   #Anesthesia: spinal #FWB: Cat I, NST reactive #GBS/ID: Unknown #MOF: breast feeding and bottle  feeding #MOC: bilateral tubal ligation #Circ: No  #Severe pre-eclampsia: multiple severe range BP's, also with RUQ pain. Labs pending. Start Mg gtt.   #T2DM: on NPH 40u BID and lispro 40u TID prenatally.  She had a fetal echo that was normal during this pregnancy. Will check CBG now, if significantly elevated plan for endotool.    Venora Maples, MD/MPH Attending Family Medicine Physician, [facility] for [facility], [facility]  05/28/2022, 7:11 PM

## 2022-05-28 NOTE — MAU Note (Signed)
Dr. Bass at bedside.  

## 2022-05-29 ENCOUNTER — Encounter (HOSPITAL_COMMUNITY): Payer: Self-pay | Admitting: Obstetrics and Gynecology

## 2022-05-29 DIAGNOSIS — Z98891 History of uterine scar from previous surgery: Secondary | ICD-10-CM

## 2022-05-29 LAB — GLUCOSE, CAPILLARY
Glucose-Capillary: 126 mg/dL — ABNORMAL HIGH (ref 70–99)
Glucose-Capillary: 129 mg/dL — ABNORMAL HIGH (ref 70–99)
Glucose-Capillary: 130 mg/dL — ABNORMAL HIGH (ref 70–99)
Glucose-Capillary: 134 mg/dL — ABNORMAL HIGH (ref 70–99)
Glucose-Capillary: 145 mg/dL — ABNORMAL HIGH (ref 70–99)
Glucose-Capillary: 158 mg/dL — ABNORMAL HIGH (ref 70–99)
Glucose-Capillary: 161 mg/dL — ABNORMAL HIGH (ref 70–99)
Glucose-Capillary: 162 mg/dL — ABNORMAL HIGH (ref 70–99)
Glucose-Capillary: 177 mg/dL — ABNORMAL HIGH (ref 70–99)
Glucose-Capillary: 186 mg/dL — ABNORMAL HIGH (ref 70–99)
Glucose-Capillary: 189 mg/dL — ABNORMAL HIGH (ref 70–99)
Glucose-Capillary: 202 mg/dL — ABNORMAL HIGH (ref 70–99)
Glucose-Capillary: 209 mg/dL — ABNORMAL HIGH (ref 70–99)

## 2022-05-29 LAB — CBC
HCT: 38.1 % (ref 36.0–46.0)
Hemoglobin: 13.5 g/dL (ref 12.0–15.0)
MCH: 30.3 pg (ref 26.0–34.0)
MCHC: 35.4 g/dL (ref 30.0–36.0)
MCV: 85.6 fL (ref 80.0–100.0)
Platelets: 282 10*3/uL (ref 150–400)
RBC: 4.45 MIL/uL (ref 3.87–5.11)
RDW: 13 % (ref 11.5–15.5)
WBC: 12.7 10*3/uL — ABNORMAL HIGH (ref 4.0–10.5)
nRBC: 0 % (ref 0.0–0.2)

## 2022-05-29 LAB — RPR: RPR Ser Ql: NONREACTIVE

## 2022-05-29 MED ORDER — LACTATED RINGERS IV SOLN: INTRAVENOUS | Status: DC

## 2022-05-29 MED ORDER — ZOLPIDEM TARTRATE 5 MG PO TABS: 5.0000 mg | ORAL_TABLET | Freq: Every evening | ORAL | Status: DC | PRN

## 2022-05-29 MED ORDER — SIMETHICONE 80 MG PO CHEW: 80.0000 mg | CHEWABLE_TABLET | ORAL | Status: DC | PRN

## 2022-05-29 MED ORDER — GABAPENTIN 100 MG PO CAPS
200.0000 mg | ORAL_CAPSULE | Freq: Every day | ORAL | Status: DC
  Administered 2022-05-29 – 2022-05-31 (×3): 200 mg via ORAL
  Filled 2022-05-29 (×3): qty 2

## 2022-05-29 MED ORDER — WITCH HAZEL-GLYCERIN EX PADS: 1.0000 | MEDICATED_PAD | CUTANEOUS | Status: DC | PRN

## 2022-05-29 MED ORDER — MENTHOL 3 MG MT LOZG: 1.0000 | LOZENGE | OROMUCOSAL | Status: DC | PRN

## 2022-05-29 MED ORDER — ACETAMINOPHEN 500 MG PO TABS
1000.0000 mg | ORAL_TABLET | Freq: Four times a day (QID) | ORAL | Status: DC
  Administered 2022-05-29 – 2022-06-01 (×12): 1000 mg via ORAL
  Filled 2022-05-29 (×12): qty 2

## 2022-05-29 MED ORDER — SIMETHICONE 80 MG PO CHEW
80.0000 mg | CHEWABLE_TABLET | Freq: Three times a day (TID) | ORAL | Status: DC
  Administered 2022-05-29 – 2022-06-01 (×11): 80 mg via ORAL
  Filled 2022-05-29 (×11): qty 1

## 2022-05-29 MED ORDER — DIPHENHYDRAMINE HCL 25 MG PO CAPS: 25.0000 mg | ORAL_CAPSULE | Freq: Four times a day (QID) | ORAL | Status: DC | PRN

## 2022-05-29 MED ORDER — OXYTOCIN-SODIUM CHLORIDE 30-0.9 UT/500ML-% IV SOLN: 2.5000 [IU]/h | INTRAVENOUS | Status: AC

## 2022-05-29 MED ORDER — OXYCODONE HCL 5 MG PO TABS
5.0000 mg | ORAL_TABLET | ORAL | Status: DC | PRN
  Administered 2022-05-30: 5 mg via ORAL
  Administered 2022-05-31 – 2022-06-01 (×3): 10 mg via ORAL
  Filled 2022-05-29 (×3): qty 2
  Filled 2022-05-29: qty 1

## 2022-05-29 MED ORDER — INSULIN NPH (HUMAN) (ISOPHANE) 100 UNIT/ML ~~LOC~~ SUSP
15.0000 [IU] | Freq: Once | SUBCUTANEOUS | Status: AC
  Administered 2022-05-29: 15 [IU] via SUBCUTANEOUS
  Filled 2022-05-29: qty 10

## 2022-05-29 MED ORDER — IBUPROFEN 600 MG PO TABS
600.0000 mg | ORAL_TABLET | Freq: Four times a day (QID) | ORAL | Status: DC
  Administered 2022-05-30 – 2022-06-01 (×10): 600 mg via ORAL
  Filled 2022-05-29 (×10): qty 1

## 2022-05-29 MED ORDER — SCOPOLAMINE 1 MG/3DAYS TD PT72
MEDICATED_PATCH | TRANSDERMAL | Status: AC
  Filled 2022-05-29: qty 1

## 2022-05-29 MED ORDER — PRENATAL MULTIVITAMIN CH
1.0000 | ORAL_TABLET | Freq: Every day | ORAL | Status: DC
  Administered 2022-05-29 – 2022-06-01 (×4): 1 via ORAL
  Filled 2022-05-29 (×4): qty 1

## 2022-05-29 MED ORDER — INSULIN NPH (HUMAN) (ISOPHANE) 100 UNIT/ML ~~LOC~~ SUSP
15.0000 [IU] | Freq: Two times a day (BID) | SUBCUTANEOUS | Status: DC
  Administered 2022-05-29 – 2022-05-30 (×3): 15 [IU] via SUBCUTANEOUS
  Filled 2022-05-29: qty 10

## 2022-05-29 MED ORDER — DIBUCAINE (PERIANAL) 1 % EX OINT: 1.0000 | TOPICAL_OINTMENT | CUTANEOUS | Status: DC | PRN

## 2022-05-29 MED ORDER — INSULIN REGULAR(HUMAN) IN NACL 100-0.9 UT/100ML-% IV SOLN: INTRAVENOUS | Status: DC

## 2022-05-29 MED ORDER — SENNOSIDES-DOCUSATE SODIUM 8.6-50 MG PO TABS
2.0000 | ORAL_TABLET | Freq: Every day | ORAL | Status: DC
  Administered 2022-05-30 – 2022-06-01 (×3): 2 via ORAL
  Filled 2022-05-29 (×3): qty 2

## 2022-05-29 MED ORDER — MAGNESIUM SULFATE 40 GM/1000ML IV SOLN
2.0000 g/h | INTRAVENOUS | Status: AC
  Administered 2022-05-29 (×2): 2 g/h via INTRAVENOUS
  Filled 2022-05-29: qty 1000

## 2022-05-29 MED ORDER — KETOROLAC TROMETHAMINE 30 MG/ML IJ SOLN
30.0000 mg | Freq: Four times a day (QID) | INTRAMUSCULAR | Status: AC
  Administered 2022-05-29 – 2022-05-30 (×4): 30 mg via INTRAVENOUS
  Filled 2022-05-29 (×4): qty 1

## 2022-05-29 MED ORDER — COCONUT OIL OIL: 1.0000 | TOPICAL_OIL | Status: DC | PRN

## 2022-05-29 MED ORDER — TETANUS-DIPHTH-ACELL PERTUSSIS 5-2.5-18.5 LF-MCG/0.5 IM SUSY
0.5000 mL | PREFILLED_SYRINGE | Freq: Once | INTRAMUSCULAR | Status: AC
  Administered 2022-05-30: 0.5 mL via INTRAMUSCULAR
  Filled 2022-05-29: qty 0.5

## 2022-05-29 MED ORDER — ENOXAPARIN SODIUM 40 MG/0.4ML IJ SOSY
40.0000 mg | PREFILLED_SYRINGE | INTRAMUSCULAR | Status: DC
  Administered 2022-05-29 – 2022-05-31 (×3): 40 mg via SUBCUTANEOUS
  Filled 2022-05-29 (×3): qty 0.4

## 2022-05-29 MED ORDER — INSULIN ASPART 100 UNIT/ML IJ SOLN
0.0000 [IU] | Freq: Three times a day (TID) | INTRAMUSCULAR | Status: DC
  Administered 2022-05-29: 5 [IU] via SUBCUTANEOUS
  Administered 2022-05-29: 3 [IU] via SUBCUTANEOUS
  Administered 2022-05-30: 8 [IU] via SUBCUTANEOUS
  Administered 2022-05-30 – 2022-05-31 (×2): 3 [IU] via SUBCUTANEOUS
  Administered 2022-05-31: 5 [IU] via SUBCUTANEOUS

## 2022-05-29 MED ORDER — DEXTROSE-NACL 5-0.45 % IV SOLN: INTRAVENOUS | Status: DC

## 2022-05-29 NOTE — Anesthesia Postprocedure Evaluation (Signed)
Anesthesia Post Note  Patient: Martha [name] Soc  Procedure(s) Performed: CESAREAN SECTION WITH BILATERAL TUBAL LIGATION (Bilateral: Abdomen)     Patient location during evaluation: Mother Baby Anesthesia Type: Spinal Level of consciousness: oriented and awake and alert Pain management: pain level controlled Vital Signs Assessment: post-procedure vital signs reviewed and stable Respiratory status: spontaneous breathing and respiratory function stable Cardiovascular status: blood pressure returned to baseline and stable Postop Assessment: no headache, no backache, no apparent nausea or vomiting and able to ambulate Anesthetic complications: no  No notable events documented.  Last Vitals:  Vitals:   05/29/22 0045 05/29/22 0100  BP: (!) 149/88 (!) 135/122  Pulse: 79 81  Resp: 15 (!) 21  Temp:    SpO2: 97% 95%    Last Pain:  Vitals:   05/29/22 0045  TempSrc:   PainSc: 0-No pain   Pain Goal:    LLE Motor Response: Purposeful movement (05/29/22 0045) LLE Sensation: Tingling (05/29/22 0045) RLE Motor Response: Purposeful movement (05/29/22 0045) RLE Sensation: Tingling (05/29/22 0045)     Epidural/Spinal Function Cutaneous sensation: Able to Wiggle Toes (05/29/22 0045), Patient able to flex knees: Yes (05/29/22 0045), Patient able to lift hips off bed: No (05/29/22 0045), Back pain beyond tenderness at insertion site: No (05/29/22 0045), Progressively worsening motor and/or sensory loss: No (05/29/22 0045), Bowel and/or bladder incontinence post epidural: No (05/29/22 0045)  [name]

## 2022-05-29 NOTE — Lactation Note (Addendum)
This note was copied from a baby's chart. Lactation Consultation Note  Patient Name: Boy Joniqua Lukins Soc [id number] Date: 05/29/2022 Age:37 hours  Reason for consult: Initial assessment;Late-preterm 34-36.6wks;Breastfeeding assistance;Infant < 6lbs;Maternal endocrine disorder  P6, GA [redacted]w[redacted]d, 4% weight loss, LPI,  BW: 4lbs 13oz (2183 g):Today 4 lbs 10 oz   Mother alert and cheerful upon arrival to room. Byrd Hesselbach, [facility] Spanish Interpreter present with LC. Mother states her last baby was in the NICU for 3 plus months and she reports having lots of experience with pumping and breastfeeding a small and early baby. She is still breastfeeding her "NICU daughter", now 19 months old, 3 times during the day and once at night.   Mother says she has been latching "Liam" with feeding cues and then feeding him formula. Mother would like to pump and DEBP initiated, cleaning and storage of breast milk reviewed with mother. Observed baby at breast and he latched well with swallows. He fatigued within 5 minutes and mother stopped the feeding. Instructed to latch infant's airway and provide air space around nose, when needed.   Instructed to:  Sharlene Dory with feeding cues and allow him to breastfeed while he is actively sucking, up to 10 minutes.  Pump and feed baby her expressed milk first Follow with bottle feeding formula, as needed Feed baby volumes (plus if tolerates), as indicated on infant feeding plan on crib card  SLP present and educating mother about the Dr. Lorne Skeens bottle/nipple Try to keep total feeding time within 30 minutes  Mother very cooperative and has a good understanding of feeding plan. She proudly speaks of achieving breastfeeding with her daughter born at 30 weeks and states she is very healthy.   Maternal Data Has patient been taught Hand Expression?: Yes Does the patient have breastfeeding experience prior to this delivery?: Yes How long did the patient breastfeed?: still  breastfeeding 69 month old 3-4 times per day, fed others up to 3 years  Feeding Mother's Current Feeding Choice: Breast Milk and Formula Nipple Type: Extra Slow Flow  LATCH Score Latch: Grasps breast easily, tongue down, lips flanged, rhythmical sucking.  Audible Swallowing: A few with stimulation  Type of Nipple: Everted at rest and after stimulation  Comfort (Breast/Nipple): Soft / non-tender  Hold (Positioning): Assistance needed to correctly position infant at breast and maintain latch.  LATCH Score: 8   Lactation Tools Discussed/Used Tools: Pump;Flanges Flange Size: 21 Breast pump type: Double-Electric Breast Pump Pump Education: Setup, frequency, and cleaning;Milk Storage Reason for Pumping: stimulate milk supply, provide supplemental feeding Pumping frequency: every 3 hrs for 15 min  Interventions Interventions: Breast feeding basics reviewed;Hand express;Breast compression;Adjust position;DEBP;Education;LC Services brochure;LPT handout/interventions     Consult Status Consult Status: Follow-up Date: 05/30/22 Follow-up type: In-patient    Christella Hartigan M 05/29/2022, 12:11 PM

## 2022-05-29 NOTE — Op Note (Signed)
Martha [name] PROCEDURE DATE: 05/29/2022  PREOPERATIVE DIAGNOSES: Intrauterine pregnancy at [redacted]w[redacted]d weeks gestation; severe preeclampsia, previous C-section X 2, uncontrolled T2DM, undesired fertility  POSTOPERATIVE DIAGNOSES: The same  PROCEDURE: Repeat Low Transverse Cesarean Section  SURGEON:  Dr. Mariel Aloe  ASSISTANT:  Sheppard Evens MD  An experienced assistant was required given the standard of surgical care given the complexity of the case.  This assistant was needed for exposure, dissection, suctioning, retraction, instrument exchange, assisting with delivery with administration of fundal pressure, and for overall help during the procedure.  ANESTHESIOLOGY TEAM: Anesthesiologist: [name], MD CRNA: Claudina Lick, CRNA; Orlie Pollen, CRNA  INDICATIONS: Martha [name] [name] is a 37 y.o. [phone] at [redacted]w[redacted]d here for cesarean section secondary to the indications listed under preoperative diagnoses; please see preoperative note for further details.  The risks of surgery were discussed with the patient including but were not limited to: bleeding which may require transfusion or reoperation; infection which may require antibiotics; injury to bowel, bladder, ureters or other surrounding organs; injury to the fetus; need for additional procedures including hysterectomy in the event of a life-threatening hemorrhage; formation of adhesions; placental abnormalities wth subsequent pregnancies; incisional problems; thromboembolic phenomenon and other postoperative/anesthesia complications.  The patient desires tubal ligation. Risks of the procedure were discussed including the permanent nature of the procedure, and other forms of birth control were discussed. She desires to go ahead with a Bilateral tubal ligation.The patient concurred with the proposed plan, giving informed written consent for the procedure.    FINDINGS:  Viable female infant in cephalic presentation.  Apgars 8 and  9.  Clear amniotic fluid.  Intact placenta, three vessel cord.  Normal uterus, fallopian tubes and ovaries bilaterally.  ANESTHESIA: Spinal  INTRAVENOUS FLUIDS: 1300 ml   ESTIMATED BLOOD LOSS: 102 ml URINE OUTPUT:  150 ml SPECIMENS: Placenta sent to pathology COMPLICATIONS: None immediate  PROCEDURE IN DETAIL:  The patient preoperatively received intravenous antibiotics and had sequential compression devices applied to her lower extremities.  She was then taken to the operating room where spinal anesthesia was administered in sterile fashion and was found to be adequate. She was then placed in a dorsal supine position with a leftward tilt, and prepped and draped in a sterile manner.  A foley catheter was placed into her bladder and attached to constant gravity.  After an adequate timeout was performed, a Pfannenstiel skin incision was made with scalpel two fingerbreaths above the pubic symphasis on her preexisting scar and carried through to the underlying layer of fascia. The fascia was incised in the midline, and this incision was extended bilaterally using the Mayo scissors.  Kocher clamps x 2 were applied to the superior aspect of the fascial incision and the underlying rectus muscles were dissected off bluntly and sharply.  A similar process was carried out on the inferior aspect of the fascial incision. The rectus muscles were separated in the midline and the peritoneum was entered bluntly. The Alexis self-retaining retractor was introduced into the abdominal cavity.  Attention was turned to the lower uterine segment where a low transverse hysterotomy was made with a scalpel and extended bilaterally bluntly.  The infant was successfully delivered, the cord was clamped and cut after one minute, and the infant was handed over to the awaiting neonatology team. Uterine massage was then administered, and the placenta delivered intact with a three-vessel cord. The uterus was then cleared of clots and debris  using manual curettage.  The  uterine incision was closed with 0 monocryl in a single layer running unlocked fashion. Hemostasis was observed.  The right Fallopian tube was identified by tracing out to the fimbraie, grasped with the Babcock clamps. A ligasure device was used to perform a salpingectomy was performed in the usual fashion. The same procedure was repeated on the left fallopian tube. Excellent hemostasis was observed in bilateral adnexae.  The hysterotomy was re-inspected. Figure-of-eight 0 Vicryl serosal stitches were placed to help with hemostasis.  The pelvis was cleared of all clot and debris with irrigation and suction. Hemostasis was confirmed on all surfaces.  The retractor was removed.  The peritoneum was closed with a 2-0 Vicryl running stitch. The fascia was then closed using 0 Vicryl in a running fashion.  The subcutaneous layer was irrigated, re-approximated with 2-0 plain gut continuous stitches, and the skin was closed with a 4-0 Vicryl subcuticular stitch. The patient tolerated the procedure well. Sponge, instrument and needle counts were correct x 3.  She was taken to the recovery room in stable condition.   Sheppard Evens MD MPH OB Fellow, Faculty Practice [facility] 05/29/2022

## 2022-05-29 NOTE — Progress Notes (Signed)
Subjective: Postpartum Day 0: Cesarean Delivery/BTL  Patient has no complaints this morning. Pain controlled. Tolerating diet  Objective: Vital signs in last 24 hours: Temp:  [97.5 F (36.4 C)-98.9 F (37.2 C)] 97.5 F (36.4 C) (05/09 0835) Pulse Rate:  [62-100] 62 (05/09 0835) Resp:  [14-21] 14 (05/09 0835) BP: (124-210)/(75-122) 142/75 (05/09 0835) SpO2:  [93 %-99 %] 93 % (05/09 0835) Weight:  [83.8 kg] 83.8 kg (05/08 1800)  Physical Exam:  General: alert Lochia: appropriate Uterine Fundus: firm Incision: healing well DVT Evaluation: No evidence of DVT seen on physical exam.  Recent Labs    05/28/22 1829 05/29/22 0725  HGB 13.7 13.5  HCT 39.7 38.1    Assessment/Plan: Status post Cesarean section. Stable BP stable, continue magnesium x 24 hrs total. CBG's stable. Will d/c endotool and continue with insulin regiment and SSI as need. Appreciate DM coordinator's assistance. Continue with progressive care  Hermina Staggers, MD 05/29/2022, 9:02 AM

## 2022-05-29 NOTE — Progress Notes (Signed)
[name] was referred for history of depression. * Referral screened out by Clinical Social Worker because none of the following criteria appear to apply: ~ History of anxiety/depression during this pregnancy, or of post-partum depression following prior delivery. No concerns regarding depression noted in prenatal records. ~ Diagnosis of anxiety and/or depression within last 3 years. [name]'s depression dates back to 2015.  OR * [name]'s symptoms currently being treated with medication and/or therapy.  Please contact the Clinical Social Worker if needs arise, by [name] request, or if [name] scores greater than 9/yes to question 10 on Edinburgh Postpartum Depression Screen.  Martha Weide, LCSW Clinical Social Worker [facility] Cell#: [phone] 

## 2022-05-29 NOTE — Discharge Summary (Signed)
Postpartum Discharge Summary  Date of Service updated: 06/01/22     Patient Name: Martha [name] Soc DOB: 1985-12-07 MRN: [id number]  Date of admission: 05/28/2022 Delivery date:05/28/2022  Delivering provider: Warden Fillers  Date of discharge: 06/01/2022  Admitting diagnosis: Severe pre-eclampsia [O14.10] Status post repeat low transverse cesarean section [Z98.891] Intrauterine pregnancy: [redacted]w[redacted]d     Secondary diagnosis:  Principal Problem:   Status post repeat low transverse cesarean section Active Problems:   Diabetes mellitus affecting pregnancy, antepartum   Language barrier   Obesity affecting pregnancy, antepartum   Chronic hypertension affecting pregnancy   Severe pre-eclampsia   Supervision of high risk pregnancy, antepartum   History of cesarean delivery   Advanced maternal age in multigravida, second trimester  Additional problems: NA    Discharge diagnosis: Preterm Pregnancy Delivered                                              Post partum procedures: Magnesium x 24 hrs Augmentation: N/A Complications: severe pre-eclampsia, uncontrolled type 2 diabetes.  Hospital course: Sceduled C/S   37 y.o. yo [id number] at [redacted]w[redacted]d was admitted to the hospital 05/28/2022 for chronic hypertension with superimposed pre-eclampsia with severe features. Also noted to have uncontrolled T2DM with significant hyperglycemia on admission. She was scheduled for a cesarean section immediately due to the above listed indications. Delivery details are as follows:  Membrane Rupture Time/Date:  ,05/28/2022   Delivery Method:C-Section, Low Transverse  Details of operation can be found in separate operative note.  Pt received Magnesium x 24 hrs postpartum. She was started on antihypertensive medications with good response. Her insulin regiment was adjust to achieve better glycemic control as well. She progressed to ambulating, voiding, tolerating diet and good oral pain control. Felt amendable for discharge  home. Discharge instructions, medications and follow up were reviewed with pt. Pt verbalized understanding.  Patient is discharged home in stable condition on  06/01/22. Live interrupter was used for discharge        Newborn Data: Birth date:05/28/2022  Birth time:10:36 PM  Gender:Female  Living status:Living  Apgars:8 ,9  [id number] g     Magnesium Sulfate received: Yes: Seizure prophylaxis BMZ received: No Rhophylac:N/A MMR:N/A T-DaP:Given prenatally Flu: No Transfusion:No  Physical exam  Vitals:   05/31/22 2011 05/31/22 2331 06/01/22 0602 06/01/22 0845  BP: (!) 143/94 139/81 (!) 145/95 (!) 144/98  Pulse: (!) 105 96 86 (!) 105  Resp: 16 15 18    Temp: 98 F (36.7 C) 98 F (36.7 C) 98 F (36.7 C) 97.9 F (36.6 C)  TempSrc: Oral Oral    SpO2:   99%   Weight:      Height:       General: alert Lochia: appropriate Uterine Fundus: firm Incision: Healing well with no significant drainage DVT Evaluation: No evidence of DVT seen on physical exam. Labs: Lab Results  Component Value Date   WBC 12.7 (H) 05/29/2022   HGB 13.5 05/29/2022   HCT 38.1 05/29/2022   MCV 85.6 05/29/2022   PLT 282 05/29/2022      Latest Ref Rng & Units 05/28/2022    6:29 PM  CMP  Glucose 70 - 99 mg/dL 308   BUN 6 - 20 mg/dL 9   Creatinine 6.57 - 8.46 mg/dL 9.62   Sodium 952 - 841 mmol/L 129   Potassium 3.5 -  5.1 mmol/L 3.6   Chloride 98 - 111 mmol/L 97   CO2 22 - 32 mmol/L 19   Calcium 8.9 - 10.3 mg/dL 8.3   Total Protein 6.5 - 8.1 g/dL 5.4   Total Bilirubin 0.3 - 1.2 mg/dL 0.4   Alkaline Phos 38 - 126 U/L 81   AST 15 - 41 U/L 22   ALT 0 - 44 U/L 16    Edinburgh Score:    05/31/2022    5:41 PM  Edinburgh Postnatal Depression Scale Screening Tool  I have been able to laugh and see the funny side of things. 0  I have looked forward with enjoyment to things. 1  I have blamed myself unnecessarily when things went wrong. 0  I have been anxious or worried for no good reason. 2  I have felt  scared or panicky for no good reason. 0  Things have been getting on top of me. 1  I have been so unhappy that I have had difficulty sleeping. 2  I have felt sad or miserable. 1  I have been so unhappy that I have been crying. 3  The thought of harming myself has occurred to me. 0  Edinburgh Postnatal Depression Scale Total 10     After visit meds:  Allergies as of 06/01/2022       Reactions   Influenza Vaccines Itching, Rash        Medication List     STOP taking these medications    aspirin 81 MG chewable tablet   cefadroxil 500 MG capsule Commonly known as: DURICEF   cyclobenzaprine 10 MG tablet Commonly known as: FLEXERIL   insulin lispro 100 UNIT/ML injection Commonly known as: HUMALOG   insulin NPH Human 100 UNIT/ML injection Commonly known as: HumuLIN N   Mag-Oxide 200 MG Tabs Generic drug: Magnesium Oxide -Mg Supplement   nitrofurantoin (macrocrystal-monohydrate) 100 MG capsule Commonly known as: MACROBID       TAKE these medications    famotidine 20 MG tablet Commonly known as: PEPCID Take 1 tablet (20 mg total) by mouth 2 (two) times daily.   furosemide 20 MG tablet Commonly known as: LASIX Take 1 tablet (20 mg total) by mouth daily. Start taking on: Jun 02, 2022   ibuprofen 600 MG tablet Commonly known as: ADVIL Take 1 tablet (600 mg total) by mouth every 6 (six) hours.   insulin aspart 100 UNIT/ML injection Commonly known as: novoLOG Inject 0-15 Units into the skin 3 (three) times daily with meals.   NIFEdipine 60 MG 24 hr tablet Commonly known as: ADALAT CC Take 1 tablet (60 mg total) by mouth 2 (two) times daily.   oxyCODONE 5 MG immediate release tablet Commonly known as: Oxy IR/ROXICODONE Take 1-2 tablets (5-10 mg total) by mouth every 4 (four) hours as needed for moderate pain.   prenatal multivitamin Tabs tablet Take 1 tablet by mouth daily at 12 noon. What changed:  medication strength when to take this   TRUEplus  Insulin Syringe 30G X 5/16" 0.3 ML Misc Generic drug: Insulin Syringe-Needle U-100 Use as directed twice daily.         Discharge home in stable condition Infant Feeding: Bottle and Breast Infant Disposition:NICU Discharge instruction: per After Visit Summary and Postpartum booklet. Activity: Advance as tolerated. Pelvic rest for 6 weeks.  Diet: carb modified diet Future Appointments: No future appointments.  Follow up Visit:  Follow-up Information     [facility] Follow up.   Specialty: Obstetrics and Gynecology Contact information: [address] [place] [address] [phone]                The following message was sent to [facility] by Gwenyth Allegra, MD  Please schedule this patient for a In person postpartum visit in 6 weeks with the following provider: MD. Additional Postpartum F/U:Incision check 1 week and BP check 1 week  High risk pregnancy complicated by:  cHTN with severe pre-eclampsia, T2DM, uncontrolled, preterm delivery Delivery mode:  C-Section, Low Transverse  Anticipated Birth Control:   BTL done   06/01/2022 Hermina Staggers, MD

## 2022-05-29 NOTE — Inpatient Diabetes Management (Signed)
Inpatient Diabetes Program Recommendations  AACE/ADA: New Consensus Statement on Inpatient Glycemic Control (2015)  Target Ranges:  Prepandial:   less than 140 mg/dL      Peak postprandial:   less than 180 mg/dL (1-2 hours)      Critically ill patients:  140 - 180 mg/dL   Lab Results  Component Value Date   GLUCAP 130 (H) 05/29/2022   HGBA1C 10.7 (H) 02/05/2022    Review of Glycemic Control  Latest Reference Range & Units 05/29/22 05:47 05/29/22 06:37 05/29/22 07:48 05/29/22 08:53  Glucose-Capillary 70 - 99 mg/dL 161 (H) 096 (H) 045 (H) 130 (H)  (H): Data is abnormally high Diabetes history: Type 2 DM Current orders for Inpatient glycemic control: Novolog 0-15 units TID, NPH 15 units BID  Inpatient Diabetes Program Recommendations:    Now that patient delivered: Continue with Novolog 0-15 units TID & HS and NPH 15 units BID.   Thanks, Lujean Rave, MSN, RNC-OB Diabetes Coordinator [phone] (8a-5p)

## 2022-05-30 LAB — SURGICAL PATHOLOGY

## 2022-05-30 LAB — GLUCOSE, CAPILLARY
Glucose-Capillary: 217 mg/dL — ABNORMAL HIGH (ref 70–99)
Glucose-Capillary: 188 mg/dL — ABNORMAL HIGH (ref 70–99)
Glucose-Capillary: 95 mg/dL (ref 70–99)
Glucose-Capillary: 299 mg/dL — ABNORMAL HIGH (ref 70–99)
Glucose-Capillary: 179 mg/dL — ABNORMAL HIGH (ref 70–99)

## 2022-05-30 MED ORDER — INSULIN NPH (HUMAN) (ISOPHANE) 100 UNIT/ML ~~LOC~~ SUSP
20.0000 [IU] | Freq: Two times a day (BID) | SUBCUTANEOUS | Status: DC
  Administered 2022-05-30 – 2022-05-31 (×2): 20 [IU] via SUBCUTANEOUS

## 2022-05-30 MED ORDER — LIVING WELL WITH DIABETES BOOK - IN SPANISH
Freq: Once | Status: AC
  Filled 2022-05-30: qty 1

## 2022-05-30 MED ORDER — LIVING WELL WITH DIABETES BOOK
Freq: Once | Status: AC
  Filled 2022-05-30: qty 1

## 2022-05-30 MED ORDER — NIFEDIPINE ER OSMOTIC RELEASE 30 MG PO TB24
30.0000 mg | ORAL_TABLET | Freq: Every day | ORAL | Status: DC
  Administered 2022-05-30: 30 mg via ORAL
  Filled 2022-05-30: qty 1

## 2022-05-30 NOTE — Inpatient Diabetes Management (Signed)
Inpatient Diabetes Program Recommendations  AACE/ADA: New Consensus Statement on Inpatient Glycemic Control (2015)  Target Ranges:  Prepandial:   less than 140 mg/dL      Peak postprandial:   less than 180 mg/dL (1-2 hours)      Critically ill patients:  140 - 180 mg/dL   Lab Results  Component Value Date   GLUCAP 188 (H) 05/30/2022   HGBA1C 10.7 (H) 02/05/2022     Diabetes history: DM2 Outpatient Diabetes medications:  Prior to pregnancy NPH 10 units BID Humalog 8 units TID Prior to delivery NPH 40 units BID Humalog 35 units BID Current orders for Inpatient glycemic control: NPH 20 units BID, Novolog 0-14 units TID  Spoke with patient at bedside using [place] interpreter, [place].  Educated patient on insulin requirements post delivery, decrease in insulin resistance, risk of hypoglycemia with breast feeding, keeping snacks close by and close follow up with PCP.  She states she is not convinced she has diabetes because she feels fine.  Explained basic pathophysiology of diabetes, long and short term complications.  She drinks juices and sodas.  Educated on importance of eliminating caloric beverages.  Educated on hypoglycemia, signs, symptoms and treatments.  Ordered Living Well with Diabetes booklet.    Will continue to follow while inpatient.  Thank you, Dulce Sellar, MSN, CDCES Diabetes Coordinator Inpatient Diabetes Program [phone] (team pager from 8a-5p)

## 2022-05-30 NOTE — Lactation Note (Signed)
This note was copied from a baby's chart. Lactation Consultation Note  Patient Name: Martha [name] Soc [id number] Date: 05/30/2022 Age:37 hours Reason for consult: Follow-up assessment;Late-preterm 34-36.6wks;Infant < 6lbs;Maternal endocrine disorder Noted baby wasn't taking in much for feeding. LC wanted to see if mom is BF any as well. Encouraged to pump. Mom had spouse and 2 of her other children visiting.  Mom gave LC bottle that was still good from feeding. Suggested mom feed baby and allow to rest and try to feed baby more before bottle of milk expires within the hour. Milk was in Dr. Manson Passey bottle. It it hard to get accurate amount of how much the baby takes. LC went to NICU and got a graduate feeder. Suggested mom pours how much milk she is going to feed the baby into graduate feeder then pour that milk into Dr. Manson Passey bottle and feed baby. Once feeding is complete after that hour pour milk that is left into graduate feeder to see accurately how much the baby took. Mom speaks good English and teach back communication of how to measure the intake of feeding verbalized from mom. The baby should be taking in much more . He is quickly tiring during feedings and is sleepy. LC checked diaper and it was dry. Baby was jittery. Reported consult to Nurse of strict I&O and jitteriness. LC concerned about poor intake.  Maternal Data    Feeding Mother's Current Feeding Choice: Breast Milk and Formula Nipple Type: Dr. Lorne Skeens  [facility] Score                    Lactation Tools Discussed/Used    Interventions    Discharge    Consult Status Consult Status: Follow-up Date: 05/31/22 Follow-up type: In-patient    Charyl Dancer 05/30/2022, 8:27 PM

## 2022-05-30 NOTE — Progress Notes (Signed)
Subjective: Postpartum Day 1: Cesarean Delivery Patient reports incisional pain, tolerating PO, and no problems voiding.    Objective: Vital signs in last 24 hours: Temp:  [97.5 F (36.4 C)-98.7 F (37.1 C)] 98.1 F (36.7 C) (05/10 0641) Pulse Rate:  [58-83] 64 (05/10 0641) Resp:  [14-18] 18 (05/10 0641) BP: (125-156)/(62-93) 156/92 (05/10 0641) SpO2:  [93 %-97 %] 97 % (05/10 0641)  Physical Exam:  General: alert, cooperative, and no distress Lochia: appropriate Uterine Fundus: firm Incision: Prevena operating normally DVT Evaluation: No evidence of DVT seen on physical exam.  Recent Labs    05/28/22 1829 05/29/22 0725  HGB 13.7 13.5  HCT 39.7 38.1    Assessment/Plan: Status post Cesarean section. Doing well postoperatively.  Add Procardia for BP control.  Scheryl Darter, MD 05/30/2022, 7:09 AM

## 2022-05-31 LAB — GLUCOSE, CAPILLARY
Glucose-Capillary: 78 mg/dL (ref 70–99)
Glucose-Capillary: 233 mg/dL — ABNORMAL HIGH (ref 70–99)
Glucose-Capillary: 88 mg/dL (ref 70–99)
Glucose-Capillary: 174 mg/dL — ABNORMAL HIGH (ref 70–99)
Glucose-Capillary: 170 mg/dL — ABNORMAL HIGH (ref 70–99)

## 2022-05-31 MED ORDER — NIFEDIPINE ER OSMOTIC RELEASE 60 MG PO TB24
60.0000 mg | ORAL_TABLET | Freq: Every day | ORAL | Status: DC
  Administered 2022-05-31: 60 mg via ORAL
  Filled 2022-05-31: qty 1

## 2022-05-31 MED ORDER — NIFEDIPINE 10 MG PO CAPS
10.0000 mg | ORAL_CAPSULE | Freq: Once | ORAL | Status: AC
  Administered 2022-05-31: 10 mg via ORAL
  Filled 2022-05-31: qty 1

## 2022-05-31 MED ORDER — INSULIN NPH (HUMAN) (ISOPHANE) 100 UNIT/ML ~~LOC~~ SUSP: 25.0000 [IU] | Freq: Two times a day (BID) | SUBCUTANEOUS | Status: DC

## 2022-05-31 MED ORDER — INSULIN NPH (HUMAN) (ISOPHANE) 100 UNIT/ML ~~LOC~~ SUSP
14.0000 [IU] | Freq: Two times a day (BID) | SUBCUTANEOUS | Status: DC
  Administered 2022-05-31 – 2022-06-01 (×2): 14 [IU] via SUBCUTANEOUS

## 2022-05-31 MED ORDER — INSULIN ASPART 100 UNIT/ML IJ SOLN
3.0000 [IU] | Freq: Three times a day (TID) | INTRAMUSCULAR | Status: DC
  Administered 2022-05-31 – 2022-06-01 (×3): 3 [IU] via SUBCUTANEOUS

## 2022-05-31 MED ORDER — FUROSEMIDE 20 MG PO TABS
20.0000 mg | ORAL_TABLET | Freq: Every day | ORAL | Status: DC
  Administered 2022-05-31 – 2022-06-01 (×2): 20 mg via ORAL
  Filled 2022-05-31 (×2): qty 1

## 2022-05-31 NOTE — Progress Notes (Signed)
Subjective: Postpartum Day 2: Cesarean Delivery Patient has no complaints this morning. Denies HA or visual changes Ambulating, voiding, tolerating diet and good oral pain control  Objective: Vital signs in last 24 hours: Temp:  [98.2 F (36.8 C)-99.1 F (37.3 C)] 98.2 F (36.8 C) (05/11 0619) Pulse Rate:  [76-104] 77 (05/11 0653) Resp:  [16-18] 18 (05/11 0619) BP: (125-162)/(67-98) 151/83 (05/11 0653) SpO2:  [96 %-99 %] 96 % (05/11 0620)  Physical Exam:  General: alert Lochia: appropriate Uterine Fundus: firm Incision: healing well DVT Evaluation: No evidence of DVT seen on physical exam.  Recent Labs    05/28/22 1829 05/29/22 0725  HGB 13.7 13.5  HCT 39.7 38.1    Assessment/Plan: Status post Cesarean section. Doing well postoperatively.  BP medication adjusted and Lasix added as well. NPH adjusted for better glycemic control. Continue with progressive care and hopefully will be able to discharge home tomorrow. Live interrupter used during visit  Hermina Staggers, MD 05/31/2022, 9:45 AM

## 2022-05-31 NOTE — Inpatient Diabetes Management (Signed)
Inpatient Diabetes Program Recommendations  AACE/ADA: New Consensus Statement on Inpatient Glycemic Control (2015)  Target Ranges:  Prepandial:   less than 140 mg/dL      Peak postprandial:   less than 180 mg/dL (1-2 hours)      Critically ill patients:  140 - 180 mg/dL   Lab Results  Component Value Date   GLUCAP 233 (H) 05/31/2022   HGBA1C 10.7 (H) 02/05/2022    Review of Glycemic Control  Latest Reference Range & Units 05/30/22 19:20 05/30/22 21:57 05/31/22 06:20 05/31/22 08:04  Glucose-Capillary 70 - 99 mg/dL 409 (H) 811 (H) 78 914 (H)   Diabetes history: DM  Outpatient Diabetes medications:  Prior to pregnancy NPH 10 units BID Humalog 8 units TID Prior to delivery NPH 40 units BID Humalog 35 units bid Current orders for Inpatient glycemic control:  NPH 25 units bid Novolog 0-15 units tid with meals  Inpatient Diabetes Program Recommendations:   Note fasting CBG=79 mg/dL.   -Recommend reduction of NPH to 14 units bid and add Novolog 3 units tid with meals (hold if patient eats less than 50% or NPO).   Thanks,  Beryl Meager, RN, BC-ADM Inpatient Diabetes Coordinator Pager [phone]  (8a-5p)

## 2022-05-31 NOTE — Plan of Care (Signed)
Problem: Education: Goal: Knowledge of disease or condition will improve Outcome: Progressing Goal: Knowledge of the prescribed therapeutic regimen will improve Outcome: Progressing   Problem: Fluid Volume: Goal: Peripheral tissue perfusion will improve Outcome: Progressing   Problem: Clinical Measurements: Goal: Complications related to disease process, condition or treatment will be avoided or minimized Outcome: Progressing   Problem: Education: Goal: Ability to describe self-care measures that may prevent or decrease complications (Diabetes Survival Skills Education) will improve Outcome: Progressing Goal: Individualized Educational Video(s) Outcome: Progressing   Problem: Coping: Goal: Ability to adjust to condition or change in health will improve Outcome: Progressing   Problem: Fluid Volume: Goal: Ability to maintain a balanced intake and output will improve Outcome: Progressing   Problem: Health Behavior/Discharge Planning: Goal: Ability to identify and utilize available resources and services will improve Outcome: Progressing Goal: Ability to manage health-related needs will improve Outcome: Progressing   Problem: Metabolic: Goal: Ability to maintain appropriate glucose levels will improve Outcome: Progressing   Problem: Nutritional: Goal: Maintenance of adequate nutrition will improve Outcome: Progressing Goal: Progress toward achieving an optimal weight will improve Outcome: Progressing   Problem: Skin Integrity: Goal: Risk for impaired skin integrity will decrease Outcome: Progressing   Problem: Tissue Perfusion: Goal: Adequacy of tissue perfusion will improve Outcome: Progressing   Problem: Education: Goal: Knowledge of General Education information will improve Description: Including pain rating scale, medication(s)/side effects and non-pharmacologic comfort measures Outcome: Progressing   Problem: Health Behavior/Discharge Planning: Goal:  Ability to manage health-related needs will improve Outcome: Progressing   Problem: Clinical Measurements: Goal: Ability to maintain clinical measurements within normal limits will improve Outcome: Progressing Goal: Will remain free from infection Outcome: Progressing Goal: Diagnostic test results will improve Outcome: Progressing Goal: Respiratory complications will improve Outcome: Progressing Goal: Cardiovascular complication will be avoided Outcome: Progressing   Problem: Activity: Goal: Risk for activity intolerance will decrease Outcome: Progressing   Problem: Nutrition: Goal: Adequate nutrition will be maintained Outcome: Progressing   Problem: Coping: Goal: Level of anxiety will decrease Outcome: Progressing   Problem: Elimination: Goal: Will not experience complications related to bowel motility Outcome: Progressing Goal: Will not experience complications related to urinary retention Outcome: Progressing   Problem: Pain Managment: Goal: General experience of comfort will improve Outcome: Progressing   Problem: Safety: Goal: Ability to remain free from injury will improve Outcome: Progressing   Problem: Skin Integrity: Goal: Risk for impaired skin integrity will decrease Outcome: Progressing   Problem: Education: Goal: Knowledge of the prescribed therapeutic regimen will improve Outcome: Progressing Goal: Understanding of sexual limitations or changes related to disease process or condition will improve Outcome: Progressing Goal: Individualized Educational Video(s) Outcome: Progressing   Problem: Self-Concept: Goal: Communication of feelings regarding changes in body function or appearance will improve Outcome: Progressing   Problem: Skin Integrity: Goal: Demonstration of wound healing without infection will improve Outcome: Progressing   Problem: Education: Goal: Knowledge of condition will improve Outcome: Progressing Goal: Individualized  Educational Video(s) Outcome: Progressing Goal: Individualized Newborn Educational Video(s) Outcome: Progressing   Problem: Activity: Goal: Will verbalize the importance of balancing activity with adequate rest periods Outcome: Progressing Goal: Ability to tolerate increased activity will improve Outcome: Progressing   Problem: Coping: Goal: Ability to identify and utilize available resources and services will improve Outcome: Progressing   Problem: Life Cycle: Goal: Chance of risk for complications during the postpartum period will decrease Outcome: Progressing   Problem: Role Relationship: Goal: Ability to demonstrate positive interaction with newborn will improve Outcome: Progressing  Problem: Skin Integrity: Goal: Demonstration of wound healing without infection will improve Outcome: Progressing   

## 2022-05-31 NOTE — Lactation Note (Addendum)
This note was copied from a baby's chart.  NICU Lactation Consultation Note  Patient Name: Boy Pearson Medaris Soc [id number] Date: 05/31/2022 Age:37 years  Reason for consult: Follow-up assessment; NICU baby; Late-preterm 34-36.6wks; Infant < 6lbs; Maternal endocrine disorder Type of Endocrine Disorder?: Diabetes (GDM)  SUBJECTIVE  LC in to visit with P6 Mom of infant born at [redacted]w[redacted]d weeks by C/Section.  Baby was transferred to the NICU for poor feeding. Baby at 7% weight loss.  Baby has NG tube in place.    LC set up DEBP in baby's room and provided Mom with a hand's free pumping band (medium) and assisted her to pump.  Mom expressed on maintenance setting.  Breasts are filling.  Mom will be discharged tomorrow.   Mom has a Hand Pump for home use.  Mom has WIC, referral sent for home pump.  Encouraged  Mom to do STS with baby as much as possible.  OBJECTIVE Infant data: Mother's Current Feeding Choice: Breast Milk and Formula  Infant feeding assessment No data recorded  Maternal data: [id number]  C-Section, Low Transverse Has patient been taught Hand Expression?: Yes Current breast feeding challenges:: Infant LPT transferred to NICU Does the patient have breastfeeding experience prior to this delivery?: Yes How long did the patient breastfeed?: 3 years with each of her other babies and still breastfeeding 37 month old 4 times a day Pumping frequency: Encouraged to pump at each feeding Pumped volume: 75 mL Flange Size: 21 Risk factor for low milk supply:: LPTI, <5 lbs  WIC Program: Yes WIC Referral Sent?: Yes What county?: [place]  ASSESSMENT Infant: No data recorded Maternal: Milk volume: Normal  INTERVENTIONS/PLAN Interventions: Interventions: Breast feeding basics reviewed; Skin to skin; Breast massage; Hand express; Expressed milk; DEBP; Education; Pace feeding Tools: Pump; Flanges; Bottle; Hands-free pumping top Pump Education: Setup, frequency, and cleaning; Milk  Storage  Plan: Consult Status: NICU follow-up NICU Follow-up type: Verify absence of engorgement; Verify onset of copious milk   [name] 05/31/2022, 3:49 PM

## 2022-06-01 LAB — GLUCOSE, CAPILLARY
Glucose-Capillary: 93 mg/dL (ref 70–99)
Glucose-Capillary: 83 mg/dL (ref 70–99)

## 2022-06-01 MED ORDER — NIFEDIPINE ER OSMOTIC RELEASE 60 MG PO TB24
60.0000 mg | ORAL_TABLET | Freq: Two times a day (BID) | ORAL | Status: DC
  Administered 2022-06-01: 60 mg via ORAL
  Filled 2022-06-01: qty 1

## 2022-06-01 MED ORDER — PRENATAL MULTIVITAMIN CH: 1.0000 | ORAL_TABLET | Freq: Every day | ORAL | 3 refills | Status: AC

## 2022-06-01 MED ORDER — NIFEDIPINE ER 60 MG PO TB24: 60.0000 mg | ORAL_TABLET | Freq: Two times a day (BID) | ORAL | 2 refills | Status: DC

## 2022-06-01 MED ORDER — INSULIN ASPART 100 UNIT/ML IJ SOLN: 0.0000 [IU] | Freq: Three times a day (TID) | INTRAMUSCULAR | 11 refills | Status: AC

## 2022-06-01 MED ORDER — OXYCODONE HCL 5 MG PO TABS: 5.0000 mg | ORAL_TABLET | ORAL | 0 refills | Status: DC | PRN

## 2022-06-01 MED ORDER — IBUPROFEN 600 MG PO TABS: 600.0000 mg | ORAL_TABLET | Freq: Four times a day (QID) | ORAL | 0 refills | Status: DC

## 2022-06-01 MED ORDER — FUROSEMIDE 20 MG PO TABS: 20.0000 mg | ORAL_TABLET | Freq: Every day | ORAL | 0 refills | Status: DC

## 2022-06-01 NOTE — Lactation Note (Signed)
This note was copied from a baby's chart.  NICU Lactation Consultation Note  Patient Name: Martha [name] [id number] Date: 06/01/2022 Age:37 days  Reason for consult: Follow-up assessment; NICU baby; Preterm <34wks; Maternal endocrine disorder; Other (Comment) (AMA, IUGR) Type of Endocrine Disorder?: Diabetes (DM2)  SUBJECTIVE Visited with family of 101 hours old LPI NICU female; Ms. Nigel Bridgeman is a P6 and reports the onset of lactogenesis II. She is pumping but not consistently. Explained the importance of consistent pumping for the prevention of engorgement. [name]-[name] had a NICU baby 18 months ago; baby "Martha [name]" was born at 77 weeks. She's getting discharged today. Reviewed discharge education, engorgement prevention/treatment, lactogenesis II, pumping schedule, pump settings and anticipatory guidelines.  OBJECTIVE Infant data: Mother's Current Feeding Choice: Breast Milk and Formula  Infant feeding assessment Scale for Readiness: 3 Scale for Quality: 4   Maternal data: [id number]  C-Section, Low Transverse Has patient been taught Hand Expression?: Yes Significant Breast History:: (+) breast changes Current breast feeding challenges:: Infant LPT transferred to NICU Does the patient have breastfeeding experience prior to this delivery?: Yes How long did the patient breastfeed?: 3 years with each of her other babies and still breastfeeding 71 month old 4 times a day Pumping frequency: 3 times/24 hours Pumped volume: 70 mL Flange Size: 21 Risk factor for low milk supply:: prematurity, < 5 lbs, infant separation  WIC Program: Yes WIC Referral Sent?: Yes What county?: [place]  ASSESSMENT Infant: Feeding Status: Scheduled 8-11-2-5  Maternal: Milk volume: Normal  INTERVENTIONS/PLAN Interventions: Interventions: Breast feeding basics reviewed; DEBP; Education Discharge Education: Engorgement and breast care Tools: Flanges; Pump; Hands-free pumping top Pump Education: Setup,  frequency, and cleaning; Milk Storage  Plan: Encouraged pumping on maintenance mode every 3 hours, ideally 8 pumping sessions/24 hours She'll take all pump parts to baby's room after her discharge She'll call for latch assistance PRN  No other support person at this time. All questions and concerns answered, family to contact [facility] services PRN.  Consult Status: NICU follow-up NICU Follow-up type: Verify absence of engorgement; Weekly NICU follow up   Manraj Yeo [name] 06/01/2022, 12:13 PM

## 2022-06-03 ENCOUNTER — Encounter: Payer: Self-pay | Admitting: Family Medicine

## 2022-06-03 ENCOUNTER — Telehealth (HOSPITAL_COMMUNITY): Payer: Self-pay | Admitting: *Deleted

## 2022-06-03 DIAGNOSIS — Z1331 Encounter for screening for depression: Secondary | ICD-10-CM

## 2022-06-03 NOTE — Telephone Encounter (Signed)
Inpatient EPDS=10. Ambulatory [facility] referral made. Dr. Adrian Blackwater notified via chart.   Duffy Rhody, RN 06-03-2022 at 12:40pm

## 2022-06-04 ENCOUNTER — Other Ambulatory Visit: Payer: Self-pay

## 2022-06-04 ENCOUNTER — Ambulatory Visit (INDEPENDENT_AMBULATORY_CARE_PROVIDER_SITE_OTHER): Payer: Self-pay

## 2022-06-04 VITALS — BP 154/95 | HR 92 | Ht 60.5 in | Wt 177.3 lb

## 2022-06-04 DIAGNOSIS — Z4889 Encounter for other specified surgical aftercare: Secondary | ICD-10-CM

## 2022-06-04 DIAGNOSIS — Z0130 Encounter for examination of blood pressure without abnormal findings: Secondary | ICD-10-CM

## 2022-06-04 NOTE — Progress Notes (Signed)
Blood Pressure Check Visit  Martha [name] Soc is here for blood pressure check following C-section without labor on 05/28/22. BP today is 151/97 and 154/95. Patient denies any dizzness, blurred vision, headache, shortness of breath, or peripheral edema.   Patient currently taking nifedipine 60mg  PO BID. Last dose was this morning at 1130, but patient reports that she did not take it the day before.  Reviewed with Albertine Grates, FNP. Per provider, patient to continue nifedipine as prescribed, ensure patient is taking BID. Patient to return for BP in 1 week.   Educated patient on taking BP med per prescription and to go to [facility] if any she experiences any of the symptoms from above; patient verbalized understanding. Scheduled patient for BP follow up visit on 06/12/22.   Incision Check Visit  Martha [name] Soc is here for incision check following repeat c-section on 05/28/22.   Assessment:  Education: Reviewed good wound care and s/s of infection with patient.  Patient will follow up nurse visit in 1 week.  Meryl Crutch, RN 06/04/2022  3:15 PM

## 2022-06-05 ENCOUNTER — Ambulatory Visit: Payer: Self-pay

## 2022-06-06 ENCOUNTER — Ambulatory Visit (HOSPITAL_COMMUNITY): Payer: Self-pay

## 2022-06-06 NOTE — Lactation Note (Signed)
This note was copied from a baby's chart.  Martha Lactation Consultation Note  Patient Name: Martha [name] [id number] Date: 06/06/2022 Age:37 days  Reason for consult: Weekly Martha follow-up; Late-preterm 34-36.6wks; Maternal endocrine disorder; Other (Comment); Martha baby; Infant < 6lbs (AMA, IUGR, tandem feeding "Martha [name]" her 100 month old at home) Type of Endocrine Disorder?: Diabetes (DM2)  SUBJECTIVE Visited with family of 50 23/51 weeks old AGA Martha female; [name] is a P6 and experienced breastfeeding. Martha [name] and Martha [name] asked this Martha [name] to check on mom, RN voiced she doesn't have a pump for home use and she's only been pumping while at the [name]. Martha [name] voiced that her [name] appt is not till 06/12/22, even though she told them she has a baby in Martha. Offered a [facility] loaner but she politely declined; she voiced that when she's at home baby "Martha [name]" is emptying her breasts in less than 5-10 minutes because she's still breastfeeding her and that's preventing her from getting engorged. She has also tried to take baby "Martha [name]" to breast but he's not interested yet. Assisted with this pumping session and provided bigger bottles, she was still using the colostrum containers to pump. Reviewed tandem feeding, lactogenesis III, pumping schedule and anticipatory guidelines.  OBJECTIVE Infant data: Mother's Current Feeding Choice: Breast Milk and Formula  Infant feeding assessment Scale for Readiness: 2 Scale for Quality: 3   Maternal data: [id number]  C-Section, Low Transverse Pumping frequency: 2-3 times/24 hours, but she's also taking baby "Martha [name]" her 11 months old to the breast 4 times/24 hours Pumped volume: 140 mL Flange Size: 21  WIC Program: Yes WIC Referral Sent?: Yes What county?: [place]  ASSESSMENT Infant: Feeding Status: Scheduled 8-11-2-5  Maternal: Milk volume: Normal  INTERVENTIONS/PLAN Interventions: Interventions: Breast feeding basics reviewed; DEBP;  Education Tools: Pump; Flanges; Coconut oil Pump Education: Setup, frequency, and cleaning; Milk Storage  Plan: Encouraged pumping on maintenance mode every 3 hours, ideally 8 pumping sessions/24 hours She'll pick her DEBP from the [facility] office in [place] on 06/12/2022 She'll call for latch assistance PRN   Martha [name] present and supportive. All questions and concerns answered, family to contact [facility] services PRN.  Consult Status: Martha follow-up Martha Follow-up type: Weekly Martha follow up   Martha [name] 06/06/2022, 12:20 PM

## 2022-06-10 ENCOUNTER — Ambulatory Visit: Payer: Self-pay

## 2022-06-10 NOTE — BH Specialist Note (Addendum)
Integrated Behavioral Health via Telemedicine Visit  06/17/2022 Martha [name] Soc [id number]  Number of Integrated Behavioral Health Clinician visits: 1- Initial Visit  Session Start time: 1355   Session End time: 1412  Total time in minutes: 17 Pt consents to 15 minutes only due to uninsured.  Referring Provider: Candelaria Celeste, DO Martha/Family location: Home [facility] Provider location: [facility]  All persons participating in visit: Martha [name] and [facility] Hulda Marin and Spanish interpreter, [place], [id number]  Types of Service: Individual psychotherapy and Video visit  I connected with Joaquin Bend Soc and/or Joaquin Bend Soc's  n/a  via  Telephone or [employer] Telemedicine Application  (Video is Caregility application) and verified that I am speaking with the correct person using two identifiers. Discussed confidentiality: Yes   I discussed the limitations of telemedicine and the availability of in person appointments.  Discussed there is a possibility of technology failure and discussed alternative modes of communication if that failure occurs.  I discussed that engaging in this telemedicine visit, they consent to the provision of behavioral healthcare and the services will be billed under their insurance.  Martha and/or legal guardian expressed understanding and consented to Telemedicine visit: Yes   Presenting Concerns: Martha and/or family reports the following symptoms/concerns: Depressed, anxious, guilt about baby's low birth weight and baby having to spend so much time at the hospital; pt has a good appetite and good support at home; difficulty staying asleep with baby in NICU. Pt feels hopeful baby will come home by end of this week.  Duration of problem: Postpartum; Severity of problem: moderate  Martha and/or Family's Strengths/Protective Factors: Social connections and Sense of purpose  Goals  Addressed: Martha will:  Reduce symptoms of: anxiety and depression   Increase knowledge and/or ability of: healthy habits   Demonstrate ability to: Increase healthy adjustment to current life circumstances  Progress towards Goals: Ongoing  Interventions: Interventions utilized:  Psychoeducation and/or Health Education and Supportive Reflection Standardized Assessments completed:  Not given today  Martha and/or Family Response: Martha agrees with treatment plan.   Assessment: Martha currently experiencing Adjustment disorder with mixed anxious and depressed mood.   Martha may benefit from psychoeducation and brief therapeutic interventions regarding coping with symptoms of anxiety and depression .  Plan: Follow up with behavioral health clinician on : Two weeks Behavioral recommendations:  -Continue prioritizing healthy self-care (regular meals, adequate rest; allowing practical help from supportive friends and family) until at least postpartum medical appointment Referral(s): Integrated [employer] (In Clinic)  I discussed the assessment and treatment plan with the Martha and/or parent/guardian. They were provided an opportunity to ask questions and all were answered. They agreed with the plan and demonstrated an understanding of the instructions.   They were advised to call back or seek an in-person evaluation if the symptoms worsen or if the condition fails to improve as anticipated.  Valetta Close Amnah Breuer, LCSW     02/05/2022    5:18 PM 03/19/2018    2:11 PM 01/01/2017    1:47 PM 12/10/2016    1:44 PM 12/01/2016    9:46 AM  Depression screen PHQ 2/9  Decreased Interest 2 0 0 0 0  Down, Depressed, Hopeless 2 0 0 0 3  PHQ - 2 Score 4 0 0 0 3  Altered sleeping 2    3  Tired, decreased energy 2    0  Change in appetite 0  0  Feeling bad or failure about yourself  0    0  Trouble concentrating 2    0  Moving slowly or fidgety/restless 0    0  Suicidal  thoughts 0    0  PHQ-9 Score 10    6  Difficult doing work/chores     Somewhat difficult      02/05/2022    5:19 PM 03/19/2018    2:11 PM  GAD 7 : Generalized Anxiety Score  Nervous, Anxious, on Edge 0 1  Control/stop worrying 0 0  Worry too much - different things 0 1  Trouble relaxing 0 1  Restless 0 0  Easily annoyed or irritable 0 1  Afraid - awful might happen 0 0  Total GAD 7 Score 0 4      05/31/2022    5:41 PM 12/31/2020   10:44 AM 11/30/2020   10:13 AM 11/20/2020   12:00 AM  Edinburgh Postnatal Depression Scale Screening Tool  I have been able to laugh and see the funny side of things. 0 0 1 2  I have looked forward with enjoyment to things. 1 0 1 3  I have blamed myself unnecessarily when things went wrong. 0 0 2 2  I have been anxious or worried for no good reason. 2 2 2 2   I have felt scared or panicky for no good reason. 0 0 0 2  Things have been getting on top of me. 1 0 0 1  I have been so unhappy that I have had difficulty sleeping. 2 0 0 0  I have felt sad or miserable. 1 2 1  0  I have been so unhappy that I have been crying. 3 0 1 1  The thought of harming myself has occurred to me. 0 0 0 0  Edinburgh Postnatal Depression Scale Total 10 4 8  13

## 2022-06-12 ENCOUNTER — Other Ambulatory Visit: Payer: Self-pay

## 2022-06-12 ENCOUNTER — Ambulatory Visit (INDEPENDENT_AMBULATORY_CARE_PROVIDER_SITE_OTHER): Payer: Self-pay | Admitting: *Deleted

## 2022-06-12 VITALS — BP 158/95 | HR 85 | Temp 98.2°F | Ht 60.5 in | Wt 173.6 lb

## 2022-06-12 DIAGNOSIS — O10919 Unspecified pre-existing hypertension complicating pregnancy, unspecified trimester: Secondary | ICD-10-CM

## 2022-06-12 MED ORDER — HYDROCHLOROTHIAZIDE 25 MG PO TABS
25.0000 mg | ORAL_TABLET | Freq: Every day | ORAL | 0 refills | Status: DC
Start: 2022-06-12 — End: 2022-06-30

## 2022-06-12 NOTE — Progress Notes (Signed)
Here for repeat BP after C/s for severe pre-eclampsia 05/28/22. Had BP check 06/04/22 and it was elevated but had missed a dose of meds. Today states she is taking Nifedipine 60mg  BID and took last at 9am today. She denies headaches. No edema noted. She c/o her incision started drained watery fluid today. Noted site on left side edge  pink with scant mucousy discharge.  She has been using alcohol wipes to clean it. Advised soap and water. Temperature 98.2. Denies fever at home. Discussed history and assessment with Albertine Grates, FNP and she came in to view wound. Advised keep covered with gauze. Gauze given to patient. Advised to start hydrochlorothiazide 25 mg daily in am and continue Nifedipine 60mg  BID.  Reviewed pre-ecclampsia symptoms with patient and wound care and when to report for evaluation. Scheduled 1 week nurse visit as recommended. Patient voices understanding Nancy Fetter

## 2022-06-17 ENCOUNTER — Ambulatory Visit: Payer: Self-pay

## 2022-06-17 ENCOUNTER — Ambulatory Visit: Payer: Self-pay | Admitting: Clinical

## 2022-06-17 DIAGNOSIS — F4323 Adjustment disorder with mixed anxiety and depressed mood: Secondary | ICD-10-CM

## 2022-06-17 NOTE — Patient Instructions (Signed)
[facility] at [facility] [address] [place], [place] [address] [phone] (main office) [phone] (Novalee Horsfall's office)   

## 2022-06-19 ENCOUNTER — Ambulatory Visit (INDEPENDENT_AMBULATORY_CARE_PROVIDER_SITE_OTHER): Payer: Self-pay | Admitting: *Deleted

## 2022-06-19 ENCOUNTER — Other Ambulatory Visit: Payer: Self-pay

## 2022-06-19 VITALS — BP 120/89 | HR 96 | Wt 169.7 lb

## 2022-06-19 DIAGNOSIS — G8918 Other acute postprocedural pain: Secondary | ICD-10-CM

## 2022-06-19 DIAGNOSIS — O165 Unspecified maternal hypertension, complicating the puerperium: Secondary | ICD-10-CM

## 2022-06-19 MED ORDER — IBUPROFEN 600 MG PO TABS
600.0000 mg | ORAL_TABLET | Freq: Four times a day (QID) | ORAL | 0 refills | Status: AC
Start: 2022-06-19 — End: ?

## 2022-06-19 MED ORDER — NIFEDIPINE ER 60 MG PO TB24
60.0000 mg | ORAL_TABLET | Freq: Two times a day (BID) | ORAL | 2 refills | Status: AC
Start: 2022-06-19 — End: ?

## 2022-06-19 NOTE — Progress Notes (Signed)
Here for wound check and bp check. Is taking both nifedipine BID and Hydrochlorothiazide daily. Denies headache. No edema noted. BP checked x2 .  Reviewed bp's and history with Dr. Vergie Living. Continue taking meds as ordered. Patient request refill of nifedipine and ibuprofen as she is almost out. Refill approved and sent.  Incision CDI with 1cm reddened area on left edge.  No increase in size from last week but appears more pink/reddened. Dr. Vergie Living in to view wound. Silver nitrate applied. Nurse Reviewed wound care with patient. Reviewed postpartum appointment. Reviewed pre-eclampsia signs. She voices understanding. Nancy Fetter

## 2022-06-20 NOTE — BH Specialist Note (Signed)
Pt did not arrive to video visit and did not answer the phone; Left HIPPA-compliant message to call back [name] from [employer] for [employer] at [facility] at  [phone] [facility] office), via [place] interpreter, [place], [phone].

## 2022-06-22 ENCOUNTER — Inpatient Hospital Stay (HOSPITAL_COMMUNITY): Admit: 2022-06-22 | Payer: Self-pay | Admitting: Obstetrics & Gynecology

## 2022-06-30 ENCOUNTER — Ambulatory Visit (HOSPITAL_COMMUNITY): Payer: Self-pay

## 2022-06-30 ENCOUNTER — Other Ambulatory Visit: Payer: Self-pay

## 2022-06-30 ENCOUNTER — Ambulatory Visit (INDEPENDENT_AMBULATORY_CARE_PROVIDER_SITE_OTHER): Payer: Self-pay | Admitting: Obstetrics and Gynecology

## 2022-06-30 ENCOUNTER — Encounter: Payer: Self-pay | Admitting: Obstetrics and Gynecology

## 2022-06-30 VITALS — BP 126/89 | HR 88 | Ht 60.0 in | Wt 173.0 lb

## 2022-06-30 DIAGNOSIS — Z1331 Encounter for screening for depression: Secondary | ICD-10-CM

## 2022-06-30 DIAGNOSIS — Z9079 Acquired absence of other genital organ(s): Secondary | ICD-10-CM

## 2022-06-30 DIAGNOSIS — O10919 Unspecified pre-existing hypertension complicating pregnancy, unspecified trimester: Secondary | ICD-10-CM

## 2022-06-30 DIAGNOSIS — Z98891 History of uterine scar from previous surgery: Secondary | ICD-10-CM

## 2022-06-30 DIAGNOSIS — I10 Essential (primary) hypertension: Secondary | ICD-10-CM

## 2022-06-30 MED ORDER — HYDROCHLOROTHIAZIDE 25 MG PO TABS
25.0000 mg | ORAL_TABLET | Freq: Every day | ORAL | 2 refills | Status: AC
Start: 2022-06-30 — End: ?

## 2022-06-30 NOTE — Lactation Note (Signed)
This note was copied from a baby's chart.  NICU Lactation Consultation Note  Patient Name: Martha [name] Soc ZOXWR'U Date: 06/30/2022 Age:37 wk.o.  Reason for consult: Weekly NICU follow-up; NICU baby; Term; Maternal endocrine disorder; Other (Comment) (AMA, IUGR, tanden feeding "[name]" her 78 month old at home, Telephone call) Type of Endocrine Disorder?: Diabetes (DM2)  SUBJECTIVE LC in the room to visit with family but family not present at this time. NICU RN Florentina Addison B. reported to this [facility] that family normally comes during night shift. Spoke to [name]-Soc over the phone to check on pumping status. She reports she's only pumping when she comes to the hospital and uses the pump in baby's room because she doesn't need one at home since she's tandem feeding her 21 month old "[name]". Noticed that baby "[name]" is getting mostly formula, he's currently on Similac 24 calorie formula. Reviewed goals and pumping schedule, [name]-Soc feels comfortable pumping at her own pace for her newborn in the NICU and breastfeeding on demand her toddler at home. She tried nursing baby "[name]" when she comes to visit but he won't latch; she takes him to the breast once/day when she's here. She mentioned that she hasn't received recent updates due to language barrier; she wants to know when baby will be coming home. Let [name]-Soc know that the provider will call her this afternoon after they are done with rounds for updates; she requested to be called after 2 pm.  OBJECTIVE Infant data: Mother's Current Feeding Choice: Breast Milk and Formula  Infant feeding assessment Scale for Readiness: 2 Scale for Quality: 3 (inconsistent coordination)   Maternal data: [id number]  C-Section, Low Transverse Pumping frequency: 1 time/24 hours; but she put her 16 month old to breast 4 times/24 hours and also nurses this baby 1 time/24 hours when she comes to visit Pumped volume: 120 mL Flange Size: 21  WIC Program:  Yes WIC Referral Sent?: Yes What county?: [place] Pump:  (No pump at home, she declined WIC pump because she's tandem feeding her 76 month old at home)  ASSESSMENT Infant: LATCH Documentation Latch: 2 Audible Swallowing: 2 Type of Nipple: 2 Comfort (Breast/Nipple): 2 Hold (Positioning): 2 LATCH Score: 10  Feeding Status: Scheduled 8-11-2-5  Maternal: Milk volume: Low (Unable to accurately calculate due to tandem feeding an older child)  INTERVENTIONS/PLAN Interventions: Interventions: Education Tools: Pump; Flanges Pump Education: Setup, frequency, and cleaning; Milk Storage  Plan: Encouraged pumping on maintenance mode every 3 hours, or at own pace She'll continue nursing this baby when she comes to the unit and her toddler at home to protect her supply She'll call for latch assistance PRN   All questions and concerns answered, family to contact [facility] services PRN.  Consult Status: NICU follow-up NICU Follow-up type: Weekly NICU follow up   Haeleigh Streiff S Philis Nettle 06/30/2022, 10:50 AM

## 2022-06-30 NOTE — Lactation Note (Signed)
This note was copied from a baby's chart. Lactation Consultation Note  Patient Name: [name] Soc ZOXWR'U Date: 06/30/2022 Age:37 wk.o.   Dr, Eric Form called [name]-Soc to provide updates regarding baby "Martha [name]"; this LC assisted with this telephone call per family request. Dr. Eric Form will F/U with family regarding updates during night shift since parents are only available to come at night. Continue current plan of care. All questions and concerns answered, family to contact [facility] services PRN.   Jjesus Dingley S [place] Steidle 06/30/2022, 4:44 PM

## 2022-06-30 NOTE — Progress Notes (Signed)
Post Partum Visit Note  Martha [name] Soc is a 37 y.o. [phone] female who presents for a postpartum visit. She is 4 weeks postpartum following a repeat cesarean section and tubal ligation was performed.  I have fully reviewed the prenatal and intrapartum course. The delivery was at 35.3 gestational weeks.  Anesthesia: spinal. Postpartum course has been good, feeling better on two blood pressure medications. Baby is doing currently in the NICU. Baby is feeding by  pumped breast milk via feeding tube . Bleeding no bleeding. Bowel function is normal. Bladder function is normal. Patient is not sexually active. Contraception method is tubal ligation. Postpartum depression screening: positive.   The pregnancy intention screening data noted above was reviewed. Potential methods of contraception were discussed. The patient had BTL in hospital    Health Maintenance Due  Topic Date Due   COVID-19 Vaccine (1) Never done   OPHTHALMOLOGY EXAM  08/19/2014   FOOT EXAM  03/20/2019    The following portions of the patient's history were reviewed and updated as appropriate: allergies, current medications, past family history, past medical history, past social history, past surgical history, and problem list.  Review of Systems Pertinent items are noted in HPI.  Objective:  LMP 11/16/2021    General:  alert and cooperative   Breasts:  normal  Lungs: Normal effort  Heart:  Regular rate and rhythm  Abdomen: soft, non-tender; bowel sounds normal; no masses,  no organomegaly   Wound well approximated incision  GU exam:  not indicated       Assessment:   1. Status post repeat low transverse cesarean section Post BTL for contraception  2. Chronic hypertension Stable today on HCTZ and nifedipine - hydrochlorothiazide (HYDRODIURIL) 25 MG tablet; Take 1 tablet (25 mg total) by mouth daily.  Dispense: 30 tablet; Refill: 2  4. Positive screening for depression on 9-item Patient Health Questionnaire  (PHQ-9) Worried about baby being in the nicu, would like to see Martha [name] Will get her scheduled, follow up as needed   Plan:   Essential components of care per ACOG recommendations:  1.  Mood and well being: Patient with positive depression screening today. Reviewed local resources for support.  - Patient tobacco use? No.   - hx of drug use? No.    2. Infant care and feeding:  -Patient currently breastmilk feeding? Yes. Discussed returning to work and pumping. Reviewed importance of draining breast regularly to support lactation.  -Social determinants of health (SDOH) reviewed in EPIC. No concerns  3. Sexuality, contraception and birth spacing - Patient does not want a pregnancy in the next year.  Desired family size is 6 children.  - Reviewed reproductive life planning. Reviewed contraceptive methods based on pt preferences and effectiveness.  Patient desired Female Sterilization    4. Sleep and fatigue -Encouraged family/partner/community support of 4 hrs of uninterrupted sleep to help with mood and fatigue  5. Physical Recovery  - Discussed patients delivery and complications. She describes her labor as good. - Patient had a C-section. Marland Kitchen Perineal healing reviewed. Patient expressed understanding - Patient has urinary incontinence? No. - Patient is safe to resume physical and sexual activity  6.  Health Maintenance - HM due items addressed Yes - Last pap smear  Diagnosis  Date Value Ref Range Status  10/11/2020   Final   - Negative for intraepithelial lesion or malignancy (NILM)   Pap smear not done at today's visit.  -Breast Cancer screening indicated? No.   7.  Chronic Disease/Pregnancy Condition follow up: Hypertension  - PCP follow up Future Appointments  Date Time Provider Department Center  07/04/2022  9:45 AM [facility]    Martha Grates, FNP Center for [employer], [facility]

## 2022-07-02 ENCOUNTER — Ambulatory Visit (HOSPITAL_COMMUNITY): Payer: Self-pay

## 2022-07-02 NOTE — Lactation Note (Signed)
This note was copied from a baby's chart.  NICU Lactation Consultation Note  Patient Name: Boy Jozlin Bently Soc ZOXWR'U Date: 07/02/2022 Age:37 wk.o.  Reason for consult: Weekly NICU follow-up; NICU baby; Maternal endocrine disorder; Term; Other (Comment); MD order (AMA, IUGR, Tandem breastfeeding) Type of Endocrine Disorder?: Diabetes (DM2)  SUBJECTIVE Visited with family of 41 1/32 weeks old AGA NICU female; Ms. Nigel Bridgeman is a P6 and reports she continues taking her toddler at home to breast but after meeting with Dr. Eric Form and SLP Irving Burton; she has decided to fully transition to formula; baby "[name]" will have a trial with Enfamil AR to see if reflux shows improvement within the next week. He is scheduled to have a swallow study tomorrow; the reason why she's leaning more towards formula right now is because he might need thicker feedings and if we start thickening her breastmilk, it will make the discharge feeding plan a bit different. [name]-Soc requested to still be F/U by lactation since she's still breastfeeding baby "[name]", her 21 month old; until she knows for sure that baby "[name]" will tolerate the new formula. Let her know that she can still request LC services anytime. All questions and concerns answered, family to contact [facility] services PRN.  OBJECTIVE Infant data: Mother's Current Feeding Choice: Formula  Infant feeding assessment Scale for Readiness: 2 Scale for Quality: 3 (increasing 4 with volume limiting)   Maternal data: [id number]  C-Section, Low Transverse No data recorded WIC Program: Yes WIC Referral Sent?: Yes What county?: [place] Pump:  (No pump at home, she declined WIC pump because she's tandem feeding her 37 month old at home)  ASSESSMENT Infant: Feeding Status: Scheduled 8-11-2-5  Maternal: No data recorded INTERVENTIONS/PLAN Interventions: Interventions: Education  Plan: Consult Status: NICU follow-up NICU Follow-up type: Baby's  discharge   Yarenis Cerino [name] 07/02/2022, 5:19 PM

## 2022-07-04 ENCOUNTER — Ambulatory Visit: Payer: Self-pay | Admitting: Clinical

## 2022-07-04 DIAGNOSIS — Z91199 Patient's noncompliance with other medical treatment and regimen due to unspecified reason: Secondary | ICD-10-CM

## 2022-07-11 ENCOUNTER — Ambulatory Visit (HOSPITAL_COMMUNITY): Payer: Self-pay

## 2022-07-11 NOTE — Lactation Note (Signed)
This note was copied from a baby's chart. Lactation Consultation Note  Patient Name: Martha [name] Date: 07/11/2022 Age:37 wk.o.   LC attempted to consult with Mom, but she wasn't present. SLP requested LC do a weighted feed at the breast.  RN to call [facility] when she arrives.  [name] 07/11/2022, 2:57 PM

## 2022-07-12 ENCOUNTER — Ambulatory Visit (HOSPITAL_COMMUNITY): Payer: Self-pay

## 2022-07-12 NOTE — Lactation Note (Signed)
This note was copied from a baby's chart.  NICU Lactation Consultation Note  Patient Name: Martha [name] Date: 07/12/2022 Age:38 wk.o.  Reason for consult: Weekly NICU follow-up; NICU baby; Term; Maternal endocrine disorder Type of Endocrine Disorder?: Diabetes (DM2)  SUBJECTIVE Visited with family of 72 34/65 weeks old AGA NICU female; Ms/ Ramos-Soc is P6 and very experienced breastfeeding. She reports that she has decided to offer the breast to baby "Martha [name]" he's currently on ad lib trial with Enfamil AR formula and direct breastfeeding. Asked [name] if we could do a weighted feed with Martha [name] but she voiced she had to leave soon and didn't have the time. Asked her to call out for lactation the next time she comes to the hospital. Parents did request an update on baby; LC called NICU RN [facility], NICU team still doing rounds, let parents know that the provider will call them later to provide updates. [name]-Soc also endorses she's only pumping while at the hospital because she takes her toddler "[name]" to the breast at home, and now baby "Martha [name]" is also going to breast. Reviewed lactogenesis III, benefits of breastmilk and tandem breastfeeding.  OBJECTIVE Infant data: Mother's Current Feeding Choice: Breast Milk and Formula  Infant feeding assessment Scale for Readiness: 2 Scale for Quality: 3   Maternal data: [id number]  C-Section, Low Transverse Pumping frequency: only when coming to visit baby at the hospital Pumped volume: 120 mL Flange Size: 21  WIC Program: Yes WIC Referral Sent?: Yes What county?: [place] Pump:  (No pump at home, she declined WIC pump because she's tandem feeding her 28 month old at home)  ASSESSMENT Infant: Feeding Status: Ad lib  Maternal: Milk volume: -- (Unable to accurately estimate, due to tandem feeding older child)  INTERVENTIONS/PLAN Interventions: Interventions: Breast feeding basics reviewed; Education Tools: Pump; Flanges Pump  Education: Setup, frequency, and cleaning; Milk Storage  Plan: Encouraged to continue pumping on maintenance mode at her own pace  She'll continue nursing baby Martha [name] when she comes to the unit and her toddler at home  She'll call NICU LC for a weighted feed   All questions and concerns answered, family to contact [facility] services PRN.   Consult Status: NICU follow-up NICU Follow-up type: Baby's discharge   Diogo Anne [name] 07/12/2022, 10:59 AM

## 2022-07-15 ENCOUNTER — Ambulatory Visit: Payer: Self-pay

## 2022-07-17 ENCOUNTER — Ambulatory Visit (HOSPITAL_COMMUNITY): Payer: Self-pay

## 2022-07-17 NOTE — Lactation Note (Signed)
This note was copied from a baby's chart.  NICU Lactation Consultation Note  Patient Name: Boy Kaiesha Tonner Soc [id number] Date: 07/17/2022 Age:37 wk.o.  Reason for consult: Weekly NICU follow-up; NICU baby; Maternal endocrine disorder; Term; Other (Comment) (AMA, tandem breastfeeding) Type of Endocrine Disorder?: Diabetes (DM2 (insulin))  SUBJECTIVE Visited with family of 68 70/84 weeks old AGA NICU female; Ms. Nigel Bridgeman is a P6 and experienced breastfeeding. She reports that she continues to pump whenever she comes to the hospital to visit baby and tandem feeds baby "[name]" at home. She's also taking "Liam" to the breast; he's now on Similac 20 calorie formula and no longer requires thickening. Ms. Nigel Bridgeman is taking baby "[name]" home today. Reviewed discharge education, her plan is to continue tandem feeding her toddler and baby; and to supplement with Similac 20 calorie formula after feedings at the breast. She politely declined an LC OP F/U; but she has their contact info in case she needs to reach out. No other support person at this time. All questions and concerns answered, family to contact [facility] services PRN.  OBJECTIVE Infant data: Mother's Current Feeding Choice: Breast Milk and Formula  Infant feeding assessment Scale for Readiness: 1 Scale for Quality: 2   Maternal data: [id number]  C-Section, Low Transverse Pumping frequency: whenever she comes to visit baby at the hospital; she has also been putting baby to breast Pumped volume: 120 mL Flange Size: 21  WIC Program: Yes WIC Referral Sent?: Yes What county?: [place] Pump:  (No pump at home, she declined WIC pump because she's tandem feeding her 87 month old at home)  ASSESSMENT Infant: LATCH Documentation Latch: 2 Audible Swallowing: 2 Type of Nipple: 2 Comfort (Breast/Nipple): 2 Hold (Positioning): 2 LATCH Score: 10  Feeding Status: Ad lib  Maternal: Milk volume: -- (Unable to accurately estimate due to tandem  breastfeeding older child)  INTERVENTIONS/PLAN Interventions: Interventions: Breast feeding basics reviewed; DEBP; Education Discharge Education: Outpatient recommendation Tools: Pump; Flanges Pump Education: Setup, frequency, and cleaning; Milk Storage  Plan: Consult Status: Complete   Kiyanna Biegler S Alegra Rost 07/17/2022, 12:50 PM

## 2022-10-27 ENCOUNTER — Encounter (HOSPITAL_COMMUNITY): Payer: Self-pay | Admitting: Obstetrics and Gynecology
